# Patient Record
Sex: Male | Born: 1955 | Hispanic: Yes | Marital: Married | Smoking: Former smoker
Health system: Southern US, Community
[De-identification: ages and names within clinical notes are randomized; demographics above are authoritative.]

## PROBLEM LIST (undated history)

## (undated) DIAGNOSIS — R519 Headache, unspecified: Secondary | ICD-10-CM

## (undated) DIAGNOSIS — I255 Ischemic cardiomyopathy: Secondary | ICD-10-CM

## (undated) DIAGNOSIS — E785 Hyperlipidemia, unspecified: Secondary | ICD-10-CM

## (undated) DIAGNOSIS — E119 Type 2 diabetes mellitus without complications: Secondary | ICD-10-CM

## (undated) DIAGNOSIS — N289 Disorder of kidney and ureter, unspecified: Secondary | ICD-10-CM

## (undated) DIAGNOSIS — I213 ST elevation (STEMI) myocardial infarction of unspecified site: Secondary | ICD-10-CM

## (undated) DIAGNOSIS — R51 Headache: Secondary | ICD-10-CM

## (undated) DIAGNOSIS — I2510 Atherosclerotic heart disease of native coronary artery without angina pectoris: Secondary | ICD-10-CM

## (undated) DIAGNOSIS — E669 Obesity, unspecified: Secondary | ICD-10-CM

## (undated) DIAGNOSIS — I10 Essential (primary) hypertension: Secondary | ICD-10-CM

## (undated) DIAGNOSIS — K219 Gastro-esophageal reflux disease without esophagitis: Secondary | ICD-10-CM

## (undated) DIAGNOSIS — Z720 Tobacco use: Secondary | ICD-10-CM

## (undated) DIAGNOSIS — G510 Bell's palsy: Secondary | ICD-10-CM

## (undated) DIAGNOSIS — M1990 Unspecified osteoarthritis, unspecified site: Secondary | ICD-10-CM

## (undated) DIAGNOSIS — I739 Peripheral vascular disease, unspecified: Secondary | ICD-10-CM

## (undated) DIAGNOSIS — Z8719 Personal history of other diseases of the digestive system: Secondary | ICD-10-CM

## (undated) DIAGNOSIS — K449 Diaphragmatic hernia without obstruction or gangrene: Secondary | ICD-10-CM

## (undated) HISTORY — PX: NO PAST SURGERIES: SHX2092

---
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
## 1998-06-20 ENCOUNTER — Emergency Department (HOSPITAL_COMMUNITY): Admission: EM | Admit: 1998-06-20 | Discharge: 1998-06-20 | Payer: Self-pay | Admitting: Emergency Medicine

## 1998-06-20 ENCOUNTER — Encounter: Payer: Self-pay | Admitting: Emergency Medicine

## 2012-01-21 ENCOUNTER — Encounter (HOSPITAL_COMMUNITY): Payer: Self-pay | Admitting: *Deleted

## 2012-01-21 ENCOUNTER — Emergency Department (HOSPITAL_COMMUNITY): Payer: Self-pay

## 2012-01-21 ENCOUNTER — Inpatient Hospital Stay (HOSPITAL_COMMUNITY)
Admission: EM | Admit: 2012-01-21 | Discharge: 2012-01-22 | DRG: 069 | Disposition: A | Discharge status: Home / Self Care | Payer: Self-pay | Attending: Internal Medicine | Admitting: Internal Medicine

## 2012-01-21 DIAGNOSIS — E669 Obesity, unspecified: Secondary | ICD-10-CM | POA: Diagnosis present

## 2012-01-21 DIAGNOSIS — R29810 Facial weakness: Secondary | ICD-10-CM | POA: Diagnosis present

## 2012-01-21 DIAGNOSIS — I10 Essential (primary) hypertension: Secondary | ICD-10-CM | POA: Diagnosis present

## 2012-01-21 DIAGNOSIS — J3489 Other specified disorders of nose and nasal sinuses: Secondary | ICD-10-CM | POA: Diagnosis present

## 2012-01-21 DIAGNOSIS — F17200 Nicotine dependence, unspecified, uncomplicated: Secondary | ICD-10-CM | POA: Diagnosis present

## 2012-01-21 DIAGNOSIS — E119 Type 2 diabetes mellitus without complications: Secondary | ICD-10-CM | POA: Diagnosis present

## 2012-01-21 DIAGNOSIS — G43809 Other migraine, not intractable, without status migrainosus: Secondary | ICD-10-CM

## 2012-01-21 DIAGNOSIS — G459 Transient cerebral ischemic attack, unspecified: Principal | ICD-10-CM | POA: Diagnosis present

## 2012-01-21 DIAGNOSIS — Z6835 Body mass index (BMI) 35.0-35.9, adult: Secondary | ICD-10-CM

## 2012-01-21 DIAGNOSIS — R471 Dysarthria and anarthria: Secondary | ICD-10-CM | POA: Diagnosis present

## 2012-01-21 DIAGNOSIS — R51 Headache: Secondary | ICD-10-CM | POA: Diagnosis present

## 2012-01-21 DIAGNOSIS — I639 Cerebral infarction, unspecified: Secondary | ICD-10-CM | POA: Diagnosis present

## 2012-01-21 DIAGNOSIS — Z720 Tobacco use: Secondary | ICD-10-CM

## 2012-01-21 DIAGNOSIS — I6340 Cerebral infarction due to embolism of unspecified cerebral artery: Secondary | ICD-10-CM

## 2012-01-21 LAB — COMPREHENSIVE METABOLIC PANEL
ALT: 21 U/L (ref 0–53)
AST: 17 U/L (ref 0–37)
Albumin: 3.9 g/dL (ref 3.5–5.2)
Alkaline Phosphatase: 70 U/L (ref 39–117)
BUN: 23 mg/dL (ref 6–23)
CO2: 24 mEq/L (ref 19–32)
Calcium: 9.3 mg/dL (ref 8.4–10.5)
Chloride: 101 mEq/L (ref 96–112)
Creatinine, Ser: 0.82 mg/dL (ref 0.50–1.35)
GFR calc Af Amer: >90 mL/min (ref >90)
GFR calc non Af Amer: >90 mL/min (ref >90)
Glucose, Bld: 148 mg/dL — ABNORMAL HIGH (ref 70–99)
Potassium: 4.2 mEq/L (ref 3.5–5.1)
Sodium: 138 mEq/L (ref 135–145)
Total Bilirubin: 0.6 mg/dL (ref 0.3–1.2)
Total Protein: 7.4 g/dL (ref 6.0–8.3)

## 2012-01-21 LAB — DIFFERENTIAL
Basophils Absolute: 0.1 10*3/uL (ref 0.0–0.1)
Basophils Relative: 1 % (ref 0–1)
Eosinophils Absolute: 0.2 10*3/uL (ref 0.0–0.7)
Eosinophils Relative: 2 % (ref 0–5)
Lymphocytes Relative: 19 % (ref 12–46)
Lymphs Abs: 2.5 10*3/uL (ref 0.7–4.0)
Monocytes Absolute: 1 10*3/uL (ref 0.1–1.0)
Monocytes Relative: 7 % (ref 3–12)
Neutro Abs: 9.2 10*3/uL — ABNORMAL HIGH (ref 1.7–7.7)
Neutrophils Relative %: 71 % (ref 43–77)

## 2012-01-21 LAB — CBC
HCT: 48.1 % (ref 39.0–52.0)
Hemoglobin: 16.7 g/dL (ref 13.0–17.0)
MCH: 31.8 pg (ref 26.0–34.0)
MCHC: 34.7 g/dL (ref 30.0–36.0)
MCV: 91.6 fL (ref 78.0–100.0)
Platelets: 344 10*3/uL (ref 150–400)
RBC: 5.25 MIL/uL (ref 4.22–5.81)
RDW: 13.3 % (ref 11.5–15.5)
WBC: 13 10*3/uL — ABNORMAL HIGH (ref 4.0–10.5)

## 2012-01-21 LAB — POCT I-STAT, CHEM 8
BUN: 24 mg/dL — ABNORMAL HIGH (ref 6–23)
Calcium, Ion: 1.22 mmol/L (ref 1.12–1.32)
Chloride: 105 mEq/L (ref 96–112)
Creatinine, Ser: 0.9 mg/dL (ref 0.50–1.35)
TCO2: 26 mmol/L (ref 0–100)

## 2012-01-21 LAB — PROTIME-INR
INR: 0.97 (ref 0.00–1.49)
Prothrombin Time: 13.1 seconds (ref 11.6–15.2)

## 2012-01-21 LAB — CK TOTAL AND CKMB (NOT AT ARMC)
CK, MB: 3.2 ng/mL (ref 0.3–4.0)
Relative Index: 3.1 — ABNORMAL HIGH (ref 0.0–2.5)
Total CK: 104 U/L (ref 7–232)

## 2012-01-21 LAB — GLUCOSE, CAPILLARY
Glucose-Capillary: 163 mg/dL — ABNORMAL HIGH (ref 70–99)
Glucose-Capillary: 144 mg/dL — ABNORMAL HIGH (ref 70–99)

## 2012-01-21 LAB — APTT: aPTT: 30 seconds (ref 24–37)

## 2012-01-21 LAB — TROPONIN I: Troponin I: <0.3 ng/mL (ref <0.30)

## 2012-01-21 LAB — POCT I-STAT TROPONIN I: Troponin i, poc: 0 ng/mL (ref 0.00–0.08)

## 2012-01-21 MED ORDER — ASPIRIN 325 MG PO TABS
325.0000 mg | ORAL_TABLET | Freq: Every day | ORAL | Status: DC
  Administered 2012-01-22: 325 mg via ORAL
  Filled 2012-01-21: qty 1

## 2012-01-21 MED ORDER — ENOXAPARIN SODIUM 40 MG/0.4ML ~~LOC~~ SOLN
40.0000 mg | Freq: Every day | SUBCUTANEOUS | Status: DC
  Administered 2012-01-21: 40 mg via SUBCUTANEOUS
  Filled 2012-01-21 (×2): qty 0.4

## 2012-01-21 MED ORDER — SODIUM CHLORIDE 0.9 % IV SOLN: 250.0000 mL | INTRAVENOUS | Status: DC | PRN

## 2012-01-21 MED ORDER — ACETAMINOPHEN 325 MG PO TABS: 650.0000 mg | ORAL_TABLET | ORAL | Status: DC | PRN

## 2012-01-21 MED ORDER — INDOMETHACIN 25 MG PO CAPS
25.0000 mg | ORAL_CAPSULE | Freq: Three times a day (TID) | ORAL | Status: DC | PRN
  Filled 2012-01-21: qty 1

## 2012-01-21 MED ORDER — SODIUM CHLORIDE 0.9 % IJ SOLN: 3.0000 mL | INTRAMUSCULAR | Status: DC | PRN

## 2012-01-21 MED ORDER — ASPIRIN 81 MG PO CHEW
324.0000 mg | CHEWABLE_TABLET | Freq: Once | ORAL | Status: AC
  Administered 2012-01-21: 324 mg via ORAL
  Filled 2012-01-21: qty 4

## 2012-01-21 MED ORDER — SODIUM CHLORIDE 0.9 % IJ SOLN
3.0000 mL | Freq: Two times a day (BID) | INTRAMUSCULAR | Status: DC
  Administered 2012-01-21 – 2012-01-22 (×2): 3 mL via INTRAVENOUS

## 2012-01-21 NOTE — ED Provider Notes (Signed)
History    56y male with left facial droop. Dysarthria per his sister but pt denies. Pt tries to downplay his symptoms though. Onset about 3 days ago. Stable since onset. Patient has no other complaints. Patient denies pain anywhere. Numbness of his left face. Denies any numbness anywhere else. Or loss of strength anywhere besides his face. No fevers or chills. Patient denies any significant past medical history but does not have routine medical care.  Pt with no PCP. Goes to Urgent Care in [place] as needed.  CSN: [id number]  Arrival date & time 01/21/12  1702   First MD Initiated Contact with Patient 01/21/12 1911      Chief Complaint  Patient presents with  . Facial Droop  . Aphasia    (Consider location/radiation/quality/duration/timing/severity/associated sxs/prior treatment) HPI  No past medical history on file.  No past surgical history on file.  No family history on file.  History  Substance Use Topics  . Smoking status: Not on file  . Smokeless tobacco: Not on file  . Alcohol Use: Not on file      Review of Systems   Review of symptoms negative unless otherwise noted in HPI.   Allergies  Review of patient's allergies indicates no known allergies.  Home Medications   Current Outpatient Rx  Name Route Sig Dispense Refill  . ADULT MULTIVITAMIN W/MINERALS CH Oral Take 1 tablet by mouth daily.      BP 160/97  Pulse 94  Temp(Src) 98.8 F (37.1 C) (Oral)  Resp 19  SpO2 95%  Physical Exam  Nursing note and vitals reviewed. Constitutional: He appears well-developed and well-nourished. No distress.       Laying in bed. NAD.   HENT:  Head: Normocephalic and atraumatic.  Eyes: Conjunctivae are normal. Right eye exhibits no discharge. Left eye exhibits no discharge.  Neck: Neck supple.  Cardiovascular: Normal rate, regular rhythm and normal heart sounds.  Exam reveals no gallop and no friction rub.   No murmur heard. Pulmonary/Chest: Effort normal  and breath sounds normal. No respiratory distress.  Abdominal: Soft. He exhibits no distension. There is no tenderness.  Musculoskeletal: He exhibits no edema and no tenderness.  Neurological: He is alert.       Left facial droop. No apparent loss of nasolabial fold. Slight left eye ptosis. Patient can clearly wrinkle both sides of his forehead. Dysarthria. Visual fields intact to confrontation. Content appropriate. Good finger to nose testing and rapid alternating finger movements bilaterally. Strength is 5 out of 5 bilateral upper lower extremities. Gait is steady.   Skin: Skin is warm and dry. He is not diaphoretic.  Psychiatric: He has a normal mood and affect. His behavior is normal. Thought content normal.    ED Course  Procedures (including critical care time)  Labs Reviewed  CBC - Abnormal; Notable for the following:    WBC 13.0 (*)    All other components within normal limits  DIFFERENTIAL - Abnormal; Notable for the following:    Neutro Abs 9.2 (*)    All other components within normal limits  COMPREHENSIVE METABOLIC PANEL - Abnormal; Notable for the following:    Glucose, Bld 148 (*)    All other components within normal limits  CK TOTAL AND CKMB - Abnormal; Notable for the following:    Relative Index 3.1 (*)    All other components within normal limits  POCT I-STAT, CHEM 8 - Abnormal; Notable for the following:    BUN 24 (*)  Glucose, Bld 149 (*)    Hemoglobin 17.7 (*)    All other components within normal limits  GLUCOSE, CAPILLARY - Abnormal; Notable for the following:    Glucose-Capillary 163 (*)    All other components within normal limits  PROTIME-INR  APTT  TROPONIN I  POCT I-STAT TROPONIN I   Ct Head Wo Contrast  01/21/2012  *RADIOLOGY REPORT*  Clinical Data: 56 year old male with left facial droop, slurred speech.  CT HEAD WITHOUT CONTRAST  Technique:  Contiguous axial images were obtained from the base of the skull through the vertex without contrast.   Comparison: None.  Findings: Visualized paranasal sinuses and mastoids are clear. Visualized orbits and scalp soft tissues are within normal limits. No acute osseous abnormality identified.  Mild Calcified atherosclerosis at the skull base.  No ventriculomegaly. No midline shift, mass effect, or evidence of mass lesion.  No acute intracranial hemorrhage identified.  Two small foci of hypodensity in the right cerebellum on series 2 image 7.  Gray-white matter differentiation elsewhere is normal. No evidence of acute cerebral cortical infarct. No suspicious intracranial vascular hyperdensity.  IMPRESSION: 1.  Age indeterminate ischemic changes in the right cerebellum. Favor nonacute. 2.  Otherwise negative noncontrast CT appearance of the brain.  Original Report Authenticated By: Harley Hallmark, M.D.     1. CVA (cerebral infarction)       MDM  56y male with left facial droop and dysarthria. The patient can clearly wrinkle his forehead suggesting that this is a central cause and not a peripheral seventh cranial nerve issue. CT of the head significant for ischemic changes in the right cerebellum. Likely not acute. Patient has a nonfocal cerebellar examination. ASA. Admit.        Raeford Razor, MD 01/21/12 2132

## 2012-01-21 NOTE — ED Notes (Signed)
MD at bedside. Dr. Conley Rolls at bedside. Pt placed on NRB at 15L per order Dr. Conley Rolls.

## 2012-01-21 NOTE — ED Notes (Signed)
MD at bedside. Dr. Kohut at bedside.  

## 2012-01-21 NOTE — ED Notes (Signed)
Attempted to call report to 4 W. RN unable to take report at this time. Will call back.  

## 2012-01-21 NOTE — H&P (Signed)
PCP:   Henri Medal, MD, MD   Chief Complaint: Left facial droop, dysarthria, and right sided headache.   HPI: Jonathan Miller is an 56 y.o. male with past medical history, on chronic medication, cigarette smoker, presents to [facility] long emergency room as he had some dysarthria and left facial droop. He has chronic intermittent right-sided headache, accompanied by nasal congestion and watery eyes lasting several days at a time. Interestingly, he said that when he has these symptoms his left eye is smaller than the right, and he feels left facial droop previously with his headaches. He denied left or right extremity weakness, clumsiness , or vertigo. Workup in emergency room included a head CT showing ischemic changes in the right cerebellum. His serologies were rather unremarkable. He is in sinus rhythm.  Rewiew of Systems:  The patient denies anorexia, fever, weight loss,, vision loss, decreased hearing, hoarseness, chest pain, syncope, dyspnea on exertion, peripheral edema, balance deficits, hemoptysis, abdominal pain, melena, hematochezia, severe indigestion/heartburn, hematuria, incontinence, genital sores, muscle weakness, suspicious skin lesions, transient blindness, difficulty walking, depression, unusual weight change, abnormal bleeding, enlarged lymph nodes, angioedema, and breast masses.   No past medical history on file.  No past surgical history on file.  Medications:  HOME MEDS: Prior to Admission medications   Medication Sig Start Date End Date Taking? Authorizing Provider  Multiple Vitamin (MULITIVITAMIN WITH MINERALS) TABS Take 1 tablet by mouth daily.   Yes Historical Provider, MD     Allergies:  No Known Allergies  Social History:   does not have a smoking history on file. He does not have any smokeless tobacco history on file. His alcohol and drug histories not on file.  Family History: No family history on file.   Physical Exam: Filed Vitals:   01/21/12  1800 01/21/12 2007 01/21/12 2012 01/21/12 2151  BP:   160/92 143/88  Pulse: 94  97 72  Temp:  99.3 F (37.4 C)    TempSrc:      Resp: 19  15 16   SpO2: 95%  95% 100%   Blood pressure 143/88, pulse 72, temperature 99.3 F (37.4 C), temperature source Oral, resp. rate 16, SpO2 100.00%.  GEN:  Pleasant person lying in the stretcher in no acute distress; cooperative with exam PSYCH:  alert and oriented x4; does not appear anxious or depressed; affect is appropriate. HEENT: Mucous membranes pink and anicteric; PERRLA; EOM intact; no cervical lymphadenopathy nor thyromegaly or carotid bruit; no JVD; he has left facial droop, able to wrinkle his forehead. Breasts:: Not examined CHEST WALL: No tenderness CHEST: Normal respiration, clear to auscultation bilaterally HEART: Regular rate and rhythm; no murmurs rubs or gallops BACK: No kyphosis or scoliosis; no CVA tenderness ABDOMEN: Obese, soft non-tender; no masses, no organomegaly, normal abdominal bowel sounds; no pannus; no intertriginous candida. Rectal Exam: Not done EXTREMITIES: No bone or joint deformity; age-appropriate arthropathy of the hands and knees; no edema; no ulcerations. Genitalia: not examined PULSES: 2+ and symmetric SKIN: Normal hydration no rash or ulceration CNS: Cranial nerves 2-12 grossly intact no focal lateralizing neurologic deficit. speech is fluent. Uvula elevated with phonation, strength of the upper and lower extremities are normal. Cerebellar exams is intact. Babinski's negative.   Labs & Imaging Results for orders placed during the hospital encounter of 01/21/12 (from the past 48 hour(s))  PROTIME-INR     Status: Normal   Collection Time   01/21/12  5:18 PM      Component Value Range Comment  Prothrombin Time 13.1  11.6 - 15.2 (seconds)    INR 0.97  0.00 - 1.49    APTT     Status: Normal   Collection Time   01/21/12  5:18 PM      Component Value Range Comment   aPTT 30  24 - 37 (seconds)   CBC     Status:  Abnormal   Collection Time   01/21/12  5:18 PM      Component Value Range Comment   WBC 13.0 (*) 4.0 - 10.5 (K/uL)    RBC 5.25  4.22 - 5.81 (MIL/uL)    Hemoglobin 16.7  13.0 - 17.0 (g/dL)    HCT 16.1  09.6 - 04.5 (%)    MCV 91.6  78.0 - 100.0 (fL)    MCH 31.8  26.0 - 34.0 (pg)    MCHC 34.7  30.0 - 36.0 (g/dL)    RDW 40.9  81.1 - 91.4 (%)    Platelets 344  150 - 400 (K/uL)   DIFFERENTIAL     Status: Abnormal   Collection Time   01/21/12  5:18 PM      Component Value Range Comment   Neutrophils Relative 71  43 - 77 (%)    Neutro Abs 9.2 (*) 1.7 - 7.7 (K/uL)    Lymphocytes Relative 19  12 - 46 (%)    Lymphs Abs 2.5  0.7 - 4.0 (K/uL)    Monocytes Relative 7  3 - 12 (%)    Monocytes Absolute 1.0  0.1 - 1.0 (K/uL)    Eosinophils Relative 2  0 - 5 (%)    Eosinophils Absolute 0.2  0.0 - 0.7 (K/uL)    Basophils Relative 1  0 - 1 (%)    Basophils Absolute 0.1  0.0 - 0.1 (K/uL)   COMPREHENSIVE METABOLIC PANEL     Status: Abnormal   Collection Time   01/21/12  5:18 PM      Component Value Range Comment   Sodium 138  135 - 145 (mEq/L)    Potassium 4.2  3.5 - 5.1 (mEq/L)    Chloride 101  96 - 112 (mEq/L)    CO2 24  19 - 32 (mEq/L)    Glucose, Bld 148 (*) 70 - 99 (mg/dL)    BUN 23  6 - 23 (mg/dL)    Creatinine, Ser 7.82  0.50 - 1.35 (mg/dL)    Calcium 9.3  8.4 - 10.5 (mg/dL)    Total Protein 7.4  6.0 - 8.3 (g/dL)    Albumin 3.9  3.5 - 5.2 (g/dL)    AST 17  0 - 37 (U/L)    ALT 21  0 - 53 (U/L)    Alkaline Phosphatase 70  39 - 117 (U/L)    Total Bilirubin 0.6  0.3 - 1.2 (mg/dL)    GFR calc non Af Amer >90  >90 (mL/min)    GFR calc Af Amer >90  >90 (mL/min)   CK TOTAL AND CKMB     Status: Abnormal   Collection Time   01/21/12  5:18 PM      Component Value Range Comment   Total CK 104  7 - 232 (U/L)    CK, MB 3.2  0.3 - 4.0 (ng/mL)    Relative Index 3.1 (*) 0.0 - 2.5    TROPONIN I     Status: Normal   Collection Time   01/21/12  5:18 PM      Component Value Range Comment  Troponin I  <0.30  <0.30 (ng/mL)   POCT I-STAT TROPONIN I     Status: Normal   Collection Time   01/21/12  6:13 PM      Component Value Range Comment   Troponin i, poc 0.00  0.00 - 0.08 (ng/mL)    Comment 3            POCT I-STAT, CHEM 8     Status: Abnormal   Collection Time   01/21/12  6:14 PM      Component Value Range Comment   Sodium 139  135 - 145 (mEq/L)    Potassium 4.1  3.5 - 5.1 (mEq/L)    Chloride 105  96 - 112 (mEq/L)    BUN 24 (*) 6 - 23 (mg/dL)    Creatinine, Ser 1.47  0.50 - 1.35 (mg/dL)    Glucose, Bld 829 (*) 70 - 99 (mg/dL)    Calcium, Ion 5.62  1.12 - 1.32 (mmol/L)    TCO2 26  0 - 100 (mmol/L)    Hemoglobin 17.7 (*) 13.0 - 17.0 (g/dL)    HCT 13.0  86.5 - 78.4 (%)   GLUCOSE, CAPILLARY     Status: Abnormal   Collection Time   01/21/12  6:14 PM      Component Value Range Comment   Glucose-Capillary 163 (*) 70 - 99 (mg/dL)    Ct Head Wo Contrast  01/21/2012  *RADIOLOGY REPORT*  Clinical Data: 56 year old male with left facial droop, slurred speech.  CT HEAD WITHOUT CONTRAST  Technique:  Contiguous axial images were obtained from the base of the skull through the vertex without contrast.  Comparison: None.  Findings: Visualized paranasal sinuses and mastoids are clear. Visualized orbits and scalp soft tissues are within normal limits. No acute osseous abnormality identified.  Mild Calcified atherosclerosis at the skull base.  No ventriculomegaly. No midline shift, mass effect, or evidence of mass lesion.  No acute intracranial hemorrhage identified.  Two small foci of hypodensity in the right cerebellum on series 2 image 7.  Gray-white matter differentiation elsewhere is normal. No evidence of acute cerebral cortical infarct. No suspicious intracranial vascular hyperdensity.  IMPRESSION: 1.  Age indeterminate ischemic changes in the right cerebellum. Favor nonacute. 2.  Otherwise negative noncontrast CT appearance of the brain.  Original Report Authenticated By: Harley Hallmark, M.D.       Assessment Present on Admission:  .CVA (cerebral infarction) .Headache   PLAN:  Will admit him for TIA-CVA workup. He has not been on aspirin so we'll start him on aspirin a day. Workup will include MRI/MRA of the head, Doppler of the carotids, and echo. I strongly recommend he stop smoking. His headache is classic for clusters headache. I gave him 100% oxygen in emergency room, and his headache resolved completely. No give indomethacin when necessary for his headache. He is stable, full code, and will be admitted to [facility].   Other plans as per orders.    Rollande Thursby 01/21/2012, 9:55 PM

## 2012-01-21 NOTE — ED Notes (Signed)
Pt states that 3 days ago he noticed his lt side facial droop and states that his eye has been watery. Alert x4, 4/4 grip strength. Sister states that it is getting worse and that he does have slurred speech,

## 2012-01-22 ENCOUNTER — Inpatient Hospital Stay (HOSPITAL_COMMUNITY): Payer: Self-pay

## 2012-01-22 ENCOUNTER — Encounter (HOSPITAL_COMMUNITY): Payer: Self-pay | Admitting: *Deleted

## 2012-01-22 DIAGNOSIS — I6340 Cerebral infarction due to embolism of unspecified cerebral artery: Secondary | ICD-10-CM

## 2012-01-22 DIAGNOSIS — G43809 Other migraine, not intractable, without status migrainosus: Secondary | ICD-10-CM

## 2012-01-22 DIAGNOSIS — G459 Transient cerebral ischemic attack, unspecified: Secondary | ICD-10-CM

## 2012-01-22 DIAGNOSIS — F17200 Nicotine dependence, unspecified, uncomplicated: Secondary | ICD-10-CM

## 2012-01-22 LAB — CBC
HCT: 47.3 % (ref 39.0–52.0)
MCHC: 34.2 g/dL (ref 30.0–36.0)
Platelets: 340 10*3/uL (ref 150–400)
RDW: 13.3 % (ref 11.5–15.5)
WBC: 9.3 10*3/uL (ref 4.0–10.5)

## 2012-01-22 LAB — BASIC METABOLIC PANEL
BUN: 17 mg/dL (ref 6–23)
Chloride: 99 mEq/L (ref 96–112)
GFR calc Af Amer: >90 mL/min (ref >90)
GFR calc non Af Amer: >90 mL/min (ref >90)
Potassium: 4.5 mEq/L (ref 3.5–5.1)
Sodium: 134 mEq/L — ABNORMAL LOW (ref 135–145)

## 2012-01-22 LAB — GLUCOSE, CAPILLARY: Glucose-Capillary: 247 mg/dL — ABNORMAL HIGH (ref 70–99)

## 2012-01-22 LAB — HEMOGLOBIN A1C
Hgb A1c MFr Bld: 9.7 % — ABNORMAL HIGH (ref <5.7)
Mean Plasma Glucose: 232 mg/dL — ABNORMAL HIGH (ref <117)

## 2012-01-22 LAB — RAPID URINE DRUG SCREEN, HOSP PERFORMED
Amphetamines: NOT DETECTED
Barbiturates: NOT DETECTED
Benzodiazepines: NOT DETECTED

## 2012-01-22 LAB — LIPID PANEL
Cholesterol: 175 mg/dL (ref 0–200)
LDL Cholesterol: 76 mg/dL (ref 0–99)
Triglycerides: 279 mg/dL — ABNORMAL HIGH (ref <150)

## 2012-01-22 MED ORDER — ASPIRIN 81 MG PO TABS: 81.0000 mg | ORAL_TABLET | Freq: Every day | ORAL | Status: AC

## 2012-01-22 MED ORDER — LIVING WELL WITH DIABETES BOOK - IN SPANISH
Freq: Once | Status: AC
  Administered 2012-01-22: 12:00:00
  Filled 2012-01-22: qty 1

## 2012-01-22 MED ORDER — LISINOPRIL 10 MG PO TABS: 20.0000 mg | ORAL_TABLET | Freq: Every day | ORAL | Status: DC

## 2012-01-22 MED ORDER — METFORMIN HCL 500 MG PO TABS: 500.0000 mg | ORAL_TABLET | Freq: Two times a day (BID) | ORAL | Status: DC

## 2012-01-22 NOTE — Discharge Summary (Signed)
Patient ID: Jonathan Miller MRN: [id number] DOB/AGE: 1956-01-11 56 y.o.  Admit date: 01/21/2012 Discharge date: 01/22/2012  Primary Care Physician:  Henri Medal, MD, MD  Discharge Diagnoses:  Dysarthria secondary to TIA  Present on Admission:  .CVA (cerebral infarction) .Headache  Active Problems:  CVA (cerebral infarction)  Headache  Tobacco abuse   Medication List  As of 01/22/2012 11:34 AM   TAKE these medications         aspirin 81 MG tablet   Take 1 tablet (81 mg total) by mouth daily.      lisinopril 10 MG tablet   Commonly known as: PRINIVIL,ZESTRIL   Take 2 tablets (20 mg total) by mouth daily.      metFORMIN 500 MG tablet   Commonly known as: GLUCOPHAGE   Take 1 tablet (500 mg total) by mouth 2 (two) times daily with a meal.      mulitivitamin with minerals Tabs   Take 1 tablet by mouth daily.            Disposition and Follow-up: Will need to see primary care provider 2 weeks after the discharge. The patient was diagnosed with diabetes, and for pt this is new diagnosis this is new diagnosis. Pt was insisting on leaving the hospital today for diabetic educator was not able to see patient prior to discharge. I have spent over one hour discussing diabetes, nutrition management, importance of medical compliance. I've also discussed with patient need for potential insulin, but pt refuses insulin at this time. I have discussed with patient meaning of A1C and explained that this number needs to be rechecked in 3 months to ensure the diabetes is being well controlled. If A1C remains persistently higher than 8, patient may need to have metformin level readjusted or consideration of insulin should be discussed. The patient was started on metformin 500 mg tablets to take twice daily.  Consults:  none  Significant Diagnostic Studies:   MRI brain:  no acute intracranial abnormalities:  CT head:  no acute intracranial abnormalities  Brief H and P: Jonathan Miller is  an 56 y.o. male with past medical history, on chronic medication, cigarette smoker, presents to [facility] long emergency room as he had some dysarthria and left facial droop. He has chronic intermittent right-sided headache, accompanied by nasal congestion and watery eyes lasting several days at a time. Interestingly, he said that when he has these symptoms his left eye is smaller than the right, and he feels left facial droop previously with his headaches. He denied left or right extremity weakness, clumsiness , or vertigo. Workup in emergency room included a head CT showing ischemic changes in the right cerebellum. His serologies were rather unremarkable. He is in sinus rhythm.  Physical Exam on Discharge:  Filed Vitals:   01/21/12 2304 01/22/12 0205 01/22/12 0538 01/22/12 1008  BP: 147/79 134/88 144/89 137/79  Pulse: 74 82 78 91  Temp: 98.2 F (36.8 C) 98.6 F (37 C) 98.4 F (36.9 C) 98.8 F (37.1 C)  TempSrc: Oral Oral Oral Oral  Resp: 18 18 18 16   Height: 5\' 5"  (1.651 m)     Weight: 96.5 kg (212 lb 11.9 oz)     SpO2: 94% 100% 97% 96%     Intake/Output Summary (Last 24 hours) at 01/22/12 1134 Last data filed at 01/22/12 0600  Gross per 24 hour  Intake      0 ml  Output    460 ml  Net   -460  ml    General: Alert, awake, oriented x3, in no acute distress. HEENT: No bruits, no goiter. Heart: Regular rate and rhythm, without murmurs, rubs, gallops. Lungs: Clear to auscultation bilaterally. Abdomen: Soft, nontender, nondistended, positive bowel sounds. Extremities: No clubbing cyanosis or edema with positive pedal pulses. Neuro: Grossly intact, nonfocal. Speech clear  CBC:    Component Value Date/Time   WBC 13.0* 01/21/2012 1718   HGB 17.7* 01/21/2012 1814   HCT 52.0 01/21/2012 1814   PLT 344 01/21/2012 1718   MCV 91.6 01/21/2012 1718   NEUTROABS 9.2* 01/21/2012 1718   LYMPHSABS 2.5 01/21/2012 1718   MONOABS 1.0 01/21/2012 1718   EOSABS 0.2 01/21/2012 1718   BASOSABS 0.1 01/21/2012 1718     Basic Metabolic Panel:    Component Value Date/Time   NA 139 01/21/2012 1814   K 4.1 01/21/2012 1814   CL 105 01/21/2012 1814   CO2 24 01/21/2012 1718   BUN 24* 01/21/2012 1814   CREATININE 0.90 01/21/2012 1814   GLUCOSE 149* 01/21/2012 1814   CALCIUM 9.3 01/21/2012 1718    Hospital Course:   Active Problems:  Dysarthria - this was determined to be most likely secondary to TIA - preliminary vascular dopplers study is negative and i have advised the pt to stay additional day to await results of 2 D ECHO - pt's symptoms have resolved and he was insisting on going home today - I have discussed the importance of medical compliance, dietary compliance - pt will be discharged on Aspirin 81 mg tablet once daily, Lisinopril, Metformin  HTN - discussed goal BP < 130/80 - will start Lisinopril  - pt will need to have BMP obtained in 1-2 weeks to ensure that electrolytes are within normal limits  Diabetes - ordered diabetic educator but pt refuses to wait - I have spent over 1 hour discussing diet and medical compliance - will start metformin for now   Headache - resolved   Tobacco abuse - discussed cessation   Time spent on Discharge: Over 30 minutes  Signed: Debbora Presto 01/22/2012, 11:34 AM  [facility], pager #: [phone] Main office number: [phone]

## 2012-01-22 NOTE — Progress Notes (Signed)
*  PRELIMINARY RESULTS* Echocardiogram 2D Echocardiogram has been performed.  Jeryl Columbia R 01/22/2012, 10:00 AM

## 2012-01-22 NOTE — Discharge Instructions (Signed)
Diabetes, Type 2 Diabetes is a long-lasting (chronic) disease. In type 2 diabetes, the pancreas does not make enough insulin (a hormone), and the body does not respond normally to the insulin that is made. This type of diabetes was also previously called adult-onset diabetes. It usually occurs after the age of 70, but it can occur at any age.  CAUSES  Type 2 diabetes happens because the pancreasis not making enough insulin or your body has trouble using the insulin that your pancreas does make properly. SYMPTOMS   Drinking more than usual.   Urinating more than usual.   Blurred vision.   Dry, itchy skin.   Frequent infections.   Feeling more tired than usual (fatigue).  DIAGNOSIS The diagnosis of type 2 diabetes is usually made by one of the following tests:  Fasting blood glucose test. You will not eat for at least 8 hours and then take a blood test.   Random blood glucose test. Your blood glucose (sugar) is checked at any time of the day regardless of when you ate.   Oral glucose tolerance test (OGTT). Your blood glucose is measured after you have not eaten (fasted) and then after you drink a glucose containing beverage.  TREATMENT   Healthy eating.   Exercise.   Medicine, if needed.   Monitoring blood glucose.   Seeing your caregiver regularly.  HOME CARE INSTRUCTIONS   Check your blood glucose at least once a day. More frequent monitoring may be necessary, depending on your medicines and on how well your diabetes is controlled. Your caregiver will advise you.   Take your medicine as directed by your caregiver.   Do not smoke.   Make wise food choices. Ask your caregiver for information. Weight loss can improve your diabetes.   Learn about low blood glucose (hypoglycemia) and how to treat it.   Get your eyes checked regularly.   Have a yearly physical exam. Have your blood pressure checked and your blood and urine tested.   Wear a pendant or bracelet saying  that you have diabetes.   Check your feet every night for cuts, sores, blisters, and redness. Let your caregiver know if you have any problems.  SEEK MEDICAL CARE IF:   You have problems keeping your blood glucose in target range.   You have problems with your medicines.   You have symptoms of an illness that do not improve after 24 hours.   You have a sore or wound that is not healing.   You notice a change in vision or a new problem with your vision.   You have a fever.  MAKE SURE YOU:  Understand these instructions.   Will watch your condition.   Will get help right away if you are not doing well or get worse.  Document Released: 09/06/2005 Document Revised: 08/26/2011 Document Reviewed: 02/22/2011 [facility] Patient Information 2012 [place], [place].Diabetes Meal Planning Guide The diabetes meal planning guide is a tool to help you plan your meals and snacks. It is important for people with diabetes to manage their blood glucose (sugar) levels. Choosing the right foods and the right amounts throughout your day will help control your blood glucose. Eating right can even help you improve your blood pressure and reach or maintain a healthy weight. CARBOHYDRATE COUNTING MADE EASY When you eat carbohydrates, they turn to sugar. This raises your blood glucose level. Counting carbohydrates can help you control this level so you feel better. When you plan your meals by counting  carbohydrates, you can have more flexibility in what you eat and balance your medicine with your food intake. Carbohydrate counting simply means adding up the total amount of carbohydrate grams in your meals and snacks. Try to eat about the same amount at each meal. Foods with carbohydrates are listed below. Each portion below is 1 carbohydrate serving or 15 grams of carbohydrates. Ask your dietician how many grams of carbohydrates you should eat at each meal or snack. Grains and Starches  1 slice bread.    English  muffin or hotdog/hamburger bun.    cup cold cereal (unsweetened).   ? cup cooked pasta or rice.    cup starchy vegetables (corn, potatoes, peas, beans, winter squash).   1 tortilla (6 inches).    bagel.   1 waffle or pancake (size of a CD).    cup cooked cereal.   4 to 6 small crackers.  *Whole grain is recommended. Fruit  1 cup fresh unsweetened berries, melon, papaya, pineapple.   1 small fresh fruit.    banana or mango.    cup fruit juice (4 oz unsweetened).    cup canned fruit in natural juice or water.   2 tbs dried fruit.   12 to 15 grapes or cherries.  Milk and Yogurt  1 cup fat-free or 1% milk.   1 cup soy milk.   6 oz light yogurt with sugar-free sweetener.   6 oz low-fat soy yogurt.   6 oz plain yogurt.  Vegetables  1 cup raw or  cup cooked is counted as 0 carbohydrates or a "free" food.   If you eat 3 or more servings at 1 meal, count them as 1 carbohydrate serving.  Other Carbohydrates   oz chips or pretzels.    cup ice cream or frozen yogurt.    cup sherbet or sorbet.   2 inch square cake, no frosting.   1 tbs honey, sugar, jam, jelly, or syrup.   2 small cookies.   3 squares of graham crackers.   3 cups popcorn.   6 crackers.   1 cup broth-based soup.   Count 1 cup casserole or other mixed foods as 2 carbohydrate servings.   Foods with less than 20 calories in a serving may be counted as 0 carbohydrates or a "free" food.  You may want to purchase a book or computer software that lists the carbohydrate gram counts of different foods. In addition, the nutrition facts panel on the labels of the foods you eat are a good source of this information. The label will tell you how big the serving size is and the total number of carbohydrate grams you will be eating per serving. Divide this number by 15 to obtain the number of carbohydrate servings in a portion. Remember, 1 carbohydrate serving equals 15 grams of  carbohydrate. SERVING SIZES Measuring foods and serving sizes helps you make sure you are getting the right amount of food. The list below tells how big or small some common serving sizes are.  1 oz.........4 stacked dice.   3 oz........Marland KitchenDeck of cards.   1 tsp.......Marland KitchenTip of little finger.   1 tbs......Marland KitchenMarland KitchenThumb.   2 tbs.......Marland KitchenGolf ball.    cup......Marland KitchenHalf of a fist.   1 cup.......Marland KitchenA fist.  SAMPLE DIABETES MEAL PLAN Below is a sample meal plan that includes foods from the grain and starches, dairy, vegetable, fruit, and meat groups. A dietician can individualize a meal plan to fit your calorie needs and tell you the number  of servings needed from each food group. However, controlling the total amount of carbohydrates in your meal or snack is more important than making sure you include all of the food groups at every meal. You may interchange carbohydrate containing foods (dairy, starches, and fruits). The meal plan below is an example of a 2000 calorie diet using carbohydrate counting. This meal plan has 17 carbohydrate servings. Breakfast  1 cup oatmeal (2 carb servings).    cup light yogurt (1 carb serving).   1 cup blueberries (1 carb serving).    cup almonds.  Snack  1 large apple (2 carb servings).   1 low-fat string cheese stick.  Lunch  Chicken breast salad.   1 cup spinach.    cup chopped tomatoes.   2 oz chicken breast, sliced.   2 tbs low-fat [place] dressing.   12 whole-wheat crackers (2 carb servings).   12 to 15 grapes (1 carb serving).   1 cup low-fat milk (1 carb serving).  Snack  1 cup carrots.    cup hummus (1 carb serving).  Dinner  3 oz broiled salmon.   1 cup brown rice (3 carb servings).  Snack  1  cups steamed broccoli (1 carb serving) drizzled with 1 tsp olive oil and lemon juice.   1 cup light pudding (2 carb servings).  DIABETES MEAL PLANNING WORKSHEET Your dietician can use this worksheet to help you decide how many  servings of foods and what types of foods are right for you.  BREAKFAST Food Group and Servings / Carb Servings Grain/Starches __________________________________ Dairy __________________________________________ Vegetable ______________________________________ Fruit ___________________________________________ Meat __________________________________________ Fat ____________________________________________ LUNCH Food Group and Servings / Carb Servings Grain/Starches ___________________________________ Dairy ___________________________________________ Fruit ____________________________________________ Meat ___________________________________________ Fat _____________________________________________ [place] Food Group and Servings / Carb Servings Grain/Starches ___________________________________ Dairy ___________________________________________ Fruit ____________________________________________ Meat ___________________________________________ Fat _____________________________________________ SNACKS Food Group and Servings / Carb Servings Grain/Starches ___________________________________ Dairy ___________________________________________ Vegetable _______________________________________ Fruit ____________________________________________ Meat ___________________________________________ Fat _____________________________________________ DAILY TOTALS Starches _________________________ Vegetable ________________________ Fruit ____________________________ Dairy ____________________________ Meat ____________________________ Fat ______________________________ Document Released: 06/03/2005 Document Revised: 08/26/2011 Document Reviewed: 04/14/2009 [employer] Patient Information 2012 [place], [place].Diabetes and Exercise Regular exercise is important and can help:   Control blood glucose (sugar).   Decrease blood pressure.    Control blood lipids (cholesterol, triglycerides).    Improve overall health.  BENEFITS FROM EXERCISE  Improved fitness.   Improved flexibility.   Improved endurance.   Increased bone density.   Weight control.   Increased muscle strength.   Decreased body fat.   Improvement of the body's use of insulin, a hormone.   Increased insulin sensitivity.   Reduction of insulin needs.   Reduced stress and tension.   Helps you feel better.  People with diabetes who add exercise to their lifestyle gain additional benefits, including:  Weight loss.   Reduced appetite.   Improvement of the body's use of blood glucose.   Decreased risk factors for heart disease:   Lowering of cholesterol and triglycerides.   Raising the level of good cholesterol (high-density lipoproteins, HDL).   Lowering blood sugar.   Decreased blood pressure.  TYPE 1 DIABETES AND EXERCISE  Exercise will usually lower your blood glucose.   If blood glucose is greater than 240 mg/dl, check urine ketones. If ketones are present, do not exercise.   Location of the insulin injection sites may need to be adjusted with exercise. Avoid injecting insulin into areas of the body that will be exercised. For example, avoid injecting insulin  into:   The arms when playing tennis.   The legs when jogging. For more information, discuss this with your caregiver.   Keep a record of:   Food intake.   Type and amount of exercise.   Expected peak times of insulin action.   Blood glucose levels.  Do this before, during, and after exercise. Review your records with your caregiver. This will help you to develop guidelines for adjusting food intake and insulin amounts.  TYPE 2 DIABETES AND EXERCISE  Regular physical activity can help control blood glucose.   Exercise is important because it may:   Increase the body's sensitivity to insulin.   Improve blood glucose control.   Exercise reduces the risk of heart disease. It decreases serum cholesterol and  triglycerides. It also lowers blood pressure.   Those who take insulin or oral hypoglycemic agents should watch for signs of hypoglycemia. These signs include dizziness, shaking, sweating, chills, and confusion.   Body water is lost during exercise. It must be replaced. This will help to avoid loss of body fluids (dehydration) or heat stroke.  Be sure to talk to your caregiver before starting an exercise program to make sure it is safe for you. Remember, any activity is better than none.  Document Released: 11/27/2003 Document Revised: 08/26/2011 Document Reviewed: 03/13/2009 [facility] Patient Information 2012 [place], [place].

## 2012-01-22 NOTE — Progress Notes (Signed)
Patient discharged home with family, discharge instructions given and explained to paitent/family and they verbalized understand, Patient denies and pain/distress. TIA and diabetes education given and explained to patient and family and patient also watched the diabetes videos prior to discharge; F/U with PCP outpatient. Accompanied home by family. Skin intact, no wound.

## 2012-01-22 NOTE — Progress Notes (Signed)
VASCULAR LAB PRELIMINARY  PRELIMINARY  PRELIMINARY  PRELIMINARY  Carotid Dopplers completed.    Preliminary report:  No ICA stenosis.  Vertebral artery flow is antegrade.  Jonathan Miller, 01/22/2012, 11:34 AM

## 2013-02-04 ENCOUNTER — Encounter (HOSPITAL_COMMUNITY): Payer: Self-pay

## 2013-02-04 ENCOUNTER — Emergency Department (HOSPITAL_COMMUNITY)
Admission: EM | Admit: 2013-02-04 | Discharge: 2013-02-04 | Discharge status: Against Medical Advice | Payer: Self-pay | Attending: Emergency Medicine | Admitting: Emergency Medicine

## 2013-02-04 ENCOUNTER — Emergency Department (HOSPITAL_COMMUNITY): Payer: Self-pay

## 2013-02-04 DIAGNOSIS — J3489 Other specified disorders of nose and nasal sinuses: Secondary | ICD-10-CM | POA: Insufficient documentation

## 2013-02-04 DIAGNOSIS — E119 Type 2 diabetes mellitus without complications: Secondary | ICD-10-CM | POA: Insufficient documentation

## 2013-02-04 DIAGNOSIS — R059 Cough, unspecified: Secondary | ICD-10-CM | POA: Insufficient documentation

## 2013-02-04 DIAGNOSIS — Z5321 Procedure and treatment not carried out due to patient leaving prior to being seen by health care provider: Secondary | ICD-10-CM

## 2013-02-04 DIAGNOSIS — F17200 Nicotine dependence, unspecified, uncomplicated: Secondary | ICD-10-CM | POA: Insufficient documentation

## 2013-02-04 DIAGNOSIS — Z79899 Other long term (current) drug therapy: Secondary | ICD-10-CM | POA: Insufficient documentation

## 2013-02-04 DIAGNOSIS — R05 Cough: Secondary | ICD-10-CM | POA: Insufficient documentation

## 2013-02-04 NOTE — ED Provider Notes (Signed)
History     CSN: [id number]  Arrival date & time 02/04/13  0012   First MD Initiated Contact with Patient 02/04/13 [phone]      Chief Complaint  Patient presents with  . Cough  . sinus pressure     (Consider location/radiation/quality/duration/timing/severity/associated sxs/prior treatment) Patient is a 57 y.o. male presenting with cough.  Cough   I went into the room to examine the patient and he was not there. I spoke with the nurse for his room and was told that the patient left without being seen.  Past Medical History  Diagnosis Date  . Headache   . Diabetes mellitus without complication     History reviewed. No pertinent past surgical history.  No family history on file.  History  Substance Use Topics  . Smoking status: Current Every Day Smoker -- 1.00 packs/day for 44 years    Types: Cigarettes  . Smokeless tobacco: Not on file  . Alcohol Use: Yes      Review of Systems  Respiratory: Positive for cough.     Allergies  Review of patient's allergies indicates no known allergies.  Home Medications   Current Outpatient Rx  Name  Route  Sig  Dispense  Refill  . metFORMIN (GLUCOPHAGE) 500 MG tablet   Oral   Take 1 tablet (500 mg total) by mouth 2 (two) times daily with a meal.   60 tablet   3   . Multiple Vitamin (MULITIVITAMIN WITH MINERALS) TABS   Oral   Take 1 tablet by mouth daily.         Marland Kitchen EXPIRED: lisinopril (PRINIVIL) 10 MG tablet   Oral   Take 2 tablets (20 mg total) by mouth daily.   30 tablet   3     BP 147/76  Pulse 88  Temp(Src) 98.3 F (36.8 C) (Oral)  Resp 20  Ht 5\' 5"  (1.651 m)  Wt 191 lb (86.637 kg)  BMI 31.78 kg/m2  SpO2 97%  Physical Exam  ED Course  Procedures (including critical care time)  Labs Reviewed - No data to display Dg Chest 2 View  02/04/2013   *RADIOLOGY REPORT*  Clinical Data: Cough and congestion.  CHEST - 2 VIEW  Comparison: None.  Findings: Degenerative glenohumeral arthropathy noted  bilaterally. Flattened hemidiaphragms noted.  Thoracic spondylosis is present. Mild cardiomegaly noted, without edema.  No discrete airspace opacity.  IMPRESSION:  1.  Large lung volumes suggesting emphysema. 2.  Degenerative glenohumeral arthropathy bilaterally. 3.  Mild cardiomegaly.   Original Report Authenticated By: Gaylyn Rong, M.D.     MDM  Patient left prior to examination by MD or Midlevel provider.        Janne Napoleon, [place] 02/04/13 [phone]

## 2013-02-04 NOTE — ED Notes (Signed)
Pt left dept did not say anything to this nurse about leaving. Pt left w/o signing AMA papers.

## 2013-02-04 NOTE — ED Notes (Signed)
Cough, congestion, sinus pressure x 3 days

## 2013-02-04 NOTE — ED Notes (Signed)
Pt reports sinus pressure & cough for 3 days.

## 2013-02-05 NOTE — ED Provider Notes (Signed)
Medical screening examination/treatment/procedure(s) were performed by non-physician practitioner and as supervising physician I was immediately available for consultation/collaboration.  Nicoletta Dress. Colon Branch, MD 02/05/13 [phone]

## 2016-02-08 ENCOUNTER — Emergency Department (HOSPITAL_COMMUNITY): Payer: Self-pay

## 2016-02-08 ENCOUNTER — Inpatient Hospital Stay (HOSPITAL_COMMUNITY)
Admission: EM | Admit: 2016-02-08 | Discharge: 2016-02-10 | DRG: 066 | Disposition: A | Discharge status: Home / Self Care | Payer: Self-pay | Attending: Internal Medicine | Admitting: Internal Medicine

## 2016-02-08 ENCOUNTER — Encounter (HOSPITAL_COMMUNITY): Payer: Self-pay

## 2016-02-08 DIAGNOSIS — R51 Headache: Secondary | ICD-10-CM

## 2016-02-08 DIAGNOSIS — F17210 Nicotine dependence, cigarettes, uncomplicated: Secondary | ICD-10-CM | POA: Diagnosis present

## 2016-02-08 DIAGNOSIS — Z6831 Body mass index (BMI) 31.0-31.9, adult: Secondary | ICD-10-CM

## 2016-02-08 DIAGNOSIS — Z9114 Patient's other noncompliance with medication regimen: Secondary | ICD-10-CM

## 2016-02-08 DIAGNOSIS — Z91148 Patient's other noncompliance with medication regimen for other reason: Secondary | ICD-10-CM

## 2016-02-08 DIAGNOSIS — G510 Bell's palsy: Secondary | ICD-10-CM

## 2016-02-08 DIAGNOSIS — I10 Essential (primary) hypertension: Secondary | ICD-10-CM | POA: Diagnosis present

## 2016-02-08 DIAGNOSIS — I639 Cerebral infarction, unspecified: Principal | ICD-10-CM | POA: Diagnosis present

## 2016-02-08 DIAGNOSIS — E119 Type 2 diabetes mellitus without complications: Secondary | ICD-10-CM

## 2016-02-08 DIAGNOSIS — E669 Obesity, unspecified: Secondary | ICD-10-CM | POA: Diagnosis present

## 2016-02-08 DIAGNOSIS — Z720 Tobacco use: Secondary | ICD-10-CM | POA: Diagnosis present

## 2016-02-08 DIAGNOSIS — E785 Hyperlipidemia, unspecified: Secondary | ICD-10-CM | POA: Diagnosis present

## 2016-02-08 DIAGNOSIS — I6389 Other cerebral infarction: Secondary | ICD-10-CM

## 2016-02-08 DIAGNOSIS — R739 Hyperglycemia, unspecified: Secondary | ICD-10-CM | POA: Insufficient documentation

## 2016-02-08 DIAGNOSIS — R519 Headache, unspecified: Secondary | ICD-10-CM | POA: Diagnosis present

## 2016-02-08 DIAGNOSIS — Z9119 Patient's noncompliance with other medical treatment and regimen: Secondary | ICD-10-CM

## 2016-02-08 HISTORY — DX: Personal history of other diseases of the digestive system: Z87.19

## 2016-02-08 HISTORY — DX: Tobacco use: Z72.0

## 2016-02-08 HISTORY — DX: Unspecified osteoarthritis, unspecified site: M19.90

## 2016-02-08 HISTORY — DX: Type 2 diabetes mellitus without complications: E11.9

## 2016-02-08 HISTORY — DX: Headache, unspecified: R51.9

## 2016-02-08 HISTORY — DX: Gastro-esophageal reflux disease without esophagitis: K21.9

## 2016-02-08 HISTORY — DX: Bell's palsy: G51.0

## 2016-02-08 HISTORY — DX: Obesity, unspecified: E66.9

## 2016-02-08 HISTORY — DX: Essential (primary) hypertension: I10

## 2016-02-08 HISTORY — DX: Headache: R51

## 2016-02-08 LAB — COMPREHENSIVE METABOLIC PANEL
ALBUMIN: 3.3 g/dL — AB (ref 3.5–5.0)
ALK PHOS: 64 U/L (ref 38–126)
ALT: 21 U/L (ref 17–63)
ANION GAP: 8 (ref 5–15)
AST: 20 U/L (ref 15–41)
BUN: 15 mg/dL (ref 6–20)
CALCIUM: 9 mg/dL (ref 8.9–10.3)
CO2: 25 mmol/L (ref 22–32)
Chloride: 101 mmol/L (ref 101–111)
Creatinine, Ser: 0.8 mg/dL (ref 0.61–1.24)
GFR calc non Af Amer: >60 mL/min (ref >60)
GLUCOSE: 241 mg/dL — AB (ref 65–99)
POTASSIUM: 3.8 mmol/L (ref 3.5–5.1)
SODIUM: 134 mmol/L — AB (ref 135–145)
Total Bilirubin: 0.4 mg/dL (ref 0.3–1.2)
Total Protein: 6.4 g/dL — ABNORMAL LOW (ref 6.5–8.1)

## 2016-02-08 LAB — URINALYSIS, ROUTINE W REFLEX MICROSCOPIC
BILIRUBIN URINE: NEGATIVE
Glucose, UA: >1000 mg/dL — AB
Hgb urine dipstick: NEGATIVE
Ketones, ur: NEGATIVE mg/dL
Leukocytes, UA: NEGATIVE
NITRITE: NEGATIVE
Protein, ur: 100 mg/dL — AB
SPECIFIC GRAVITY, URINE: 1.023 (ref 1.005–1.030)
pH: 5.5 (ref 5.0–8.0)

## 2016-02-08 LAB — URINE MICROSCOPIC-ADD ON
Bacteria, UA: NONE SEEN
RBC / HPF: NONE SEEN RBC/hpf (ref 0–5)

## 2016-02-08 LAB — CBC
HCT: 48.1 % (ref 39.0–52.0)
Hemoglobin: 15.7 g/dL (ref 13.0–17.0)
MCH: 30.4 pg (ref 26.0–34.0)
MCHC: 32.6 g/dL (ref 30.0–36.0)
MCV: 93 fL (ref 78.0–100.0)
PLATELETS: 282 10*3/uL (ref 150–400)
RBC: 5.17 MIL/uL (ref 4.22–5.81)
RDW: 12.7 % (ref 11.5–15.5)
WBC: 8.5 10*3/uL (ref 4.0–10.5)

## 2016-02-08 LAB — I-STAT TROPONIN, ED: Troponin i, poc: 0 ng/mL (ref 0.00–0.08)

## 2016-02-08 LAB — CBG MONITORING, ED
GLUCOSE-CAPILLARY: 201 mg/dL — AB (ref 65–99)
Glucose-Capillary: 221 mg/dL — ABNORMAL HIGH (ref 65–99)

## 2016-02-08 LAB — DIFFERENTIAL
BASOS PCT: 1 %
Basophils Absolute: 0.1 10*3/uL (ref 0.0–0.1)
EOS ABS: 0.3 10*3/uL (ref 0.0–0.7)
EOS PCT: 3 %
Lymphocytes Relative: 23 %
Lymphs Abs: 2 10*3/uL (ref 0.7–4.0)
MONO ABS: 0.6 10*3/uL (ref 0.1–1.0)
Monocytes Relative: 7 %
NEUTROS PCT: 66 %
Neutro Abs: 5.6 10*3/uL (ref 1.7–7.7)

## 2016-02-08 LAB — APTT: aPTT: 28 seconds (ref 24–37)

## 2016-02-08 LAB — I-STAT CHEM 8, ED
BUN: 18 mg/dL (ref 6–20)
CALCIUM ION: 1.26 mmol/L (ref 1.13–1.30)
CHLORIDE: 101 mmol/L (ref 101–111)
Creatinine, Ser: 0.7 mg/dL (ref 0.61–1.24)
Glucose, Bld: 234 mg/dL — ABNORMAL HIGH (ref 65–99)
HEMATOCRIT: 51 % (ref 39.0–52.0)
Hemoglobin: 17.3 g/dL — ABNORMAL HIGH (ref 13.0–17.0)
Potassium: 4 mmol/L (ref 3.5–5.1)
SODIUM: 139 mmol/L (ref 135–145)
TCO2: 25 mmol/L (ref 0–100)

## 2016-02-08 LAB — PROTIME-INR
INR: 0.98 (ref 0.00–1.49)
PROTHROMBIN TIME: 13.2 s (ref 11.6–15.2)

## 2016-02-08 NOTE — ED Provider Notes (Addendum)
CSN: [id number]     Arrival date & time 02/08/16  1958 History  By signing my name below, I, Marisue HumbleMichelle Chaffee, attest that this documentation has been prepared under the direction and in the presence of Shon Batonourtney F Jacolby Risby, MD . Electronically Signed: Marisue HumbleMichelle Chaffee, Scribe. 02/09/2016. 12:05 AM.   Chief Complaint  Patient presents with  . Hyperglycemia  . Hypertension   The history is provided by the patient and a relative. No language interpreter was used.   HPI Comments:  Germain OsgoodJohn W Bucher is a 60 y.o. male with PMHx of DM who presents to the Emergency Department via EMS complaining of intermittent headache and sinus congestion for the past 2-3 days. Headache is currently alleviated. Pt reports associated right-sided facial numbness for 2 days, currently alleviated, and sensation of a foreign body in his right eye for the past few days. No alleviating factors noted. He does not currently take medication for diabetes or hypertension. Pt is a current smoker. Denies weakness, dizziness, facial droop, speech difficulty, difficulty moving arms or legs, cough, or fever.  Past Medical History  Diagnosis Date  . Headache(784.0)   . Diabetes mellitus without complication (HCC)    History reviewed. No pertinent past surgical history. History reviewed. No pertinent family history. Social History  Substance Use Topics  . Smoking status: Current Every Day Smoker -- 1.00 packs/day for 44 years    Types: Cigarettes  . Smokeless tobacco: None  . Alcohol Use: Yes    Review of Systems  Constitutional: Negative for fever.  HENT: Positive for congestion.   Eyes:       Foreign body sensation  Respiratory: Negative for cough.   Neurological: Positive for numbness and headaches. Negative for dizziness, facial asymmetry, speech difficulty, weakness and light-headedness.  All other systems reviewed and are negative.  Allergies  Review of patient's allergies indicates no known allergies.  Home  Medications   Prior to Admission medications   Medication Sig Start Date End Date Taking? Authorizing Provider  lisinopril (PRINIVIL) 10 MG tablet Take 2 tablets (20 mg total) by mouth daily. Patient not taking: Reported on 02/09/2016 01/22/12 02/09/16  Dorothea OgleIskra M Myers, MD  metFORMIN (GLUCOPHAGE) 500 MG tablet Take 1 tablet (500 mg total) by mouth 2 (two) times daily with a meal. Patient not taking: Reported on 02/09/2016 01/22/12 02/09/16  Dorothea OgleIskra M Myers, MD   BP 185/97 mmHg  Pulse 102  Temp(Src) 99.3 F (37.4 C) (Oral)  Resp 17  SpO2 92% Physical Exam  Constitutional: He is oriented to person, place, and time. No distress.  HENT:  Head: Normocephalic and atraumatic.  Mouth/Throat: Oropharynx is clear and moist.  Eyes: EOM are normal. Pupils are equal, round, and reactive to light.  Cardiovascular: Normal rate, regular rhythm and normal heart sounds.   No murmur heard. Pulmonary/Chest: Effort normal and breath sounds normal. No respiratory distress. He has no wheezes.  Abdominal: Soft. Bowel sounds are normal. There is no tenderness. There is no rebound.  Musculoskeletal: He exhibits no edema.  Neurological: He is alert and oriented to person, place, and time.  Asymmetric smile, blinks his right eye involuntarily, no nasolabial fold blunting on right, droop on the left that spares the forehead, fluent speech, no drift, 5 out of 5 strength in all 4 extremities  Skin: Skin is warm and dry.  Psychiatric: He has a normal mood and affect.  Nursing note and vitals reviewed.   ED Course  Procedures  DIAGNOSTIC STUDIES:  Oxygen Saturation is 94%  on RA, adequate by my interpretation.    COORDINATION OF CARE:  12:03 AM Discussed treatment plan with pt at bedside and pt agreed to plan. 12:23 AM Consult with neurology; will order MRI.  Labs Review Labs Reviewed  URINALYSIS, ROUTINE W REFLEX MICROSCOPIC (NOT AT [facility]) - Abnormal; Notable for the following:    Glucose, UA >1000 (*)    Protein,  ur 100 (*)    All other components within normal limits  COMPREHENSIVE METABOLIC PANEL - Abnormal; Notable for the following:    Sodium 134 (*)    Glucose, Bld 241 (*)    Total Protein 6.4 (*)    Albumin 3.3 (*)    All other components within normal limits  URINE MICROSCOPIC-ADD ON - Abnormal; Notable for the following:    Squamous Epithelial / LPF 0-5 (*)    All other components within normal limits  CBG MONITORING, ED - Abnormal; Notable for the following:    Glucose-Capillary 221 (*)    All other components within normal limits  CBG MONITORING, ED - Abnormal; Notable for the following:    Glucose-Capillary 201 (*)    All other components within normal limits  I-STAT CHEM 8, ED - Abnormal; Notable for the following:    Glucose, Bld 234 (*)    Hemoglobin 17.3 (*)    All other components within normal limits  PROTIME-INR  APTT  CBC  DIFFERENTIAL  I-STAT TROPOININ, ED  CBG MONITORING, ED    Imaging Review Ct Head Wo Contrast  02/08/2016  CLINICAL DATA:  Acute onset of right facial numbness at the lips. Initial encounter. EXAM: CT HEAD WITHOUT CONTRAST TECHNIQUE: Contiguous axial images were obtained from the base of the skull through the vertex without intravenous contrast. COMPARISON:  MRI/ MRA of the brain performed 01/22/2012, and CT of the head performed 01/21/2012 FINDINGS: There is no evidence of acute infarction, mass lesion, or intra- or extra-axial hemorrhage on CT. The posterior fossa, including the cerebellum, brainstem and fourth ventricle, is within normal limits. The third and lateral ventricles, and basal ganglia are unremarkable in appearance. The cerebral hemispheres are symmetric in appearance, with normal gray-white differentiation. No mass effect or midline shift is seen. There is no evidence of fracture; visualized osseous structures are unremarkable in appearance. The orbits are within normal limits. The paranasal sinuses and mastoid air cells are well-aerated. No  significant soft tissue abnormalities are seen. IMPRESSION: Unremarkable noncontrast CT of the head. Electronically Signed   By: Roanna Raider M.D.   On: 02/08/2016 21:15   Mr Brain Wo Contrast  02/09/2016  CLINICAL DATA:  Initial evaluation for right-sided facial numbness for 2 days. EXAM: MRI HEAD WITHOUT CONTRAST TECHNIQUE: Multiplanar, multiecho pulse sequences of the brain and surrounding structures were obtained without intravenous contrast. COMPARISON:  Prior CT from 02/08/2016. FINDINGS: Study is moderately degraded by motion artifact. Age appropriate cerebral atrophy present. Mild scattered T2/FLAIR hyperintensity within the periventricular and deep white matter both cerebral hemispheres present, most likely related to mild chronic small vessel ischemic disease. Small remote right cerebellar infarct present. There is a tiny 7 mm focus of high signal intensity on axial DWI sequence in the region of the left internal capsule (series 8, image 28), suspicious for a possible small acute ischemic infarct. This is faintly seen on corresponding coronal DWI sequence. No associated hemorrhage or mass effect. No other acute infarct identified. Gray-white matter differentiation maintained. Major intracranial vascular flow voids preserved. No acute or chronic intracranial hemorrhage. No mass  lesion, midline shift, or mass effect. No hydrocephalus. No extra-axial fluid collection. Major dural sinuses are grossly patent. Craniocervical junction within normal limits. Visualized upper cervical spine unremarkable. Pituitary gland within normal limits. No acute abnormality about the orbits. Mild scattered mucosal thickening within the ethmoidal air cells. Paranasal sinuses are otherwise clear. Trace opacity within the right mastoid air cells. Left mastoid air cells are clear. Inner ear structures grossly normal. Bone marrow signal intensity within normal limits. No scalp soft tissue abnormality. IMPRESSION: 1. Mildly  hyperintense 7 mm focus of diffusion abnormality within the left internal capsule, suspicious for a small acute/early subacute ischemic infarct. No associated hemorrhage or mass effect. 2. No other acute intracranial process identified. 3. Small remote right cerebellar infarct. Electronically Signed   By: Rise Mu M.D.   On: 02/09/2016 05:08   I have personally reviewed and evaluated these images and lab results as part of my medical decision-making.   EKG Interpretation ED ECG REPORT   Date: 02/09/2016  Rate: 77  Rhythm: normal sinus rhythm  QRS Axis: normal  Intervals: normal  ST/T Wave abnormalities: normal  Conduction Disutrbances:none  Narrative Interpretation:   Old EKG Reviewed: none available  I have personally reviewed the EKG tracing and agree with the computerized printout as noted.     MDM   Final diagnoses:  Cerebrovascular accident (CVA) due to other mechanism [facility])  Essential hypertension  Hyperglycemia    Patient presents with right lip numbness, headache, and was noted to have an asymmetric smile by family member. Symptoms have been ongoing for the last 2 days. Denies any other focal deficits. Noted to be hypertensive. Lab work notable for hyperglycemia without an anion gap. Physical exam is notable for asymmetric smile and involuntary twitching of the right eye. He is otherwise neurologically intact. Stroke is certainly a consideration. Comp located migraine is another consideration. Given how hypertensive the patient is, CT scan was obtained and shows no evidence of bleed. He was given hydralazine and blood pressure improving. Will obtain MRI. Discussed with Dr. Roseanne Reno. He will evaluate the patient if MRI is positive. MRI does show a small focus in the left internal capsule concerning for acute stroke. Patient certainly has risk factors. Discussed again with Dr. Roseanne Reno. He will evaluate the patient. Will admit for stroke workup. Initially, patient was  hesitant for admission and stated "I have stuff to do." I discussed with the patient if he goes home he is at increased risk for a subsequent stroke that could leave him with deficits and/or caused death. Patient's family has urged the patient to stay. After consultation, patient has agreed to stay.  I personally performed the services described in this documentation, which was scribed in my presence. The recorded information has been reviewed and is accurate.    Shon Baton, MD 02/09/16 1610  Shon Baton, MD 02/09/16 [id number]  Shon Baton, MD 02/09/16 [phone]

## 2016-02-08 NOTE — ED Notes (Signed)
EMS gave of NS and CBG was 260.  Retaken here and was 221

## 2016-02-08 NOTE — ED Notes (Signed)
CBG was 201, RN notified

## 2016-02-08 NOTE — ED Notes (Signed)
Patients comes via EMS complaining of a headache and right lip numbness for the past 2 days.  Woke up today with left ear pain.  Hand grips and movement all normal.  Patient also has hyperglycemia hat he is currently not taking any medications for.  Patient A&Ox4

## 2016-02-09 ENCOUNTER — Encounter (HOSPITAL_COMMUNITY): Payer: Self-pay | Admitting: Internal Medicine

## 2016-02-09 ENCOUNTER — Inpatient Hospital Stay (HOSPITAL_COMMUNITY): Payer: MEDICAID

## 2016-02-09 ENCOUNTER — Emergency Department (HOSPITAL_COMMUNITY): Payer: Self-pay

## 2016-02-09 ENCOUNTER — Inpatient Hospital Stay (HOSPITAL_COMMUNITY): Payer: Self-pay

## 2016-02-09 ENCOUNTER — Ambulatory Visit (HOSPITAL_COMMUNITY): Payer: MEDICAID

## 2016-02-09 DIAGNOSIS — G459 Transient cerebral ischemic attack, unspecified: Secondary | ICD-10-CM

## 2016-02-09 DIAGNOSIS — Z9114 Patient's other noncompliance with medication regimen: Secondary | ICD-10-CM

## 2016-02-09 DIAGNOSIS — G510 Bell's palsy: Secondary | ICD-10-CM

## 2016-02-09 DIAGNOSIS — E118 Type 2 diabetes mellitus with unspecified complications: Secondary | ICD-10-CM

## 2016-02-09 DIAGNOSIS — I6350 Cerebral infarction due to unspecified occlusion or stenosis of unspecified cerebral artery: Secondary | ICD-10-CM

## 2016-02-09 DIAGNOSIS — I638 Other cerebral infarction: Secondary | ICD-10-CM

## 2016-02-09 DIAGNOSIS — R51 Headache: Secondary | ICD-10-CM

## 2016-02-09 DIAGNOSIS — E119 Type 2 diabetes mellitus without complications: Secondary | ICD-10-CM

## 2016-02-09 DIAGNOSIS — Z720 Tobacco use: Secondary | ICD-10-CM

## 2016-02-09 DIAGNOSIS — I10 Essential (primary) hypertension: Secondary | ICD-10-CM

## 2016-02-09 DIAGNOSIS — E669 Obesity, unspecified: Secondary | ICD-10-CM | POA: Diagnosis present

## 2016-02-09 DIAGNOSIS — R739 Hyperglycemia, unspecified: Secondary | ICD-10-CM | POA: Insufficient documentation

## 2016-02-09 DIAGNOSIS — I639 Cerebral infarction, unspecified: Secondary | ICD-10-CM | POA: Insufficient documentation

## 2016-02-09 DIAGNOSIS — I63312 Cerebral infarction due to thrombosis of left middle cerebral artery: Secondary | ICD-10-CM

## 2016-02-09 LAB — LIPID PANEL
CHOL/HDL RATIO: 5.2 ratio
CHOLESTEROL: 219 mg/dL — AB (ref 0–200)
HDL: 42 mg/dL (ref >40)
LDL Cholesterol: 136 mg/dL — ABNORMAL HIGH (ref 0–99)
TRIGLYCERIDES: 205 mg/dL — AB (ref <150)
VLDL: 41 mg/dL — AB (ref 0–40)

## 2016-02-09 LAB — GLUCOSE, CAPILLARY
GLUCOSE-CAPILLARY: 310 mg/dL — AB (ref 65–99)
GLUCOSE-CAPILLARY: 269 mg/dL — AB (ref 65–99)
GLUCOSE-CAPILLARY: 372 mg/dL — AB (ref 65–99)
Glucose-Capillary: 259 mg/dL — ABNORMAL HIGH (ref 65–99)

## 2016-02-09 LAB — ECHOCARDIOGRAM COMPLETE

## 2016-02-09 MED ORDER — ASPIRIN 325 MG PO TABS
325.0000 mg | ORAL_TABLET | Freq: Every day | ORAL | Status: DC
  Administered 2016-02-09 – 2016-02-10 (×2): 325 mg via ORAL
  Filled 2016-02-09 (×2): qty 1

## 2016-02-09 MED ORDER — ARTIFICIAL TEARS OP OINT
TOPICAL_OINTMENT | Freq: Every evening | OPHTHALMIC | Status: DC | PRN
  Filled 2016-02-09: qty 3.5

## 2016-02-09 MED ORDER — NICOTINE 21 MG/24HR TD PT24
21.0000 mg | MEDICATED_PATCH | Freq: Every day | TRANSDERMAL | Status: DC
Start: 2016-02-09 — End: 2016-02-10
  Administered 2016-02-09 – 2016-02-10 (×2): 21 mg via TRANSDERMAL
  Filled 2016-02-09 (×2): qty 1

## 2016-02-09 MED ORDER — ALBUTEROL SULFATE (2.5 MG/3ML) 0.083% IN NEBU: 2.5000 mg | INHALATION_SOLUTION | RESPIRATORY_TRACT | Status: DC | PRN

## 2016-02-09 MED ORDER — INSULIN ASPART 100 UNIT/ML ~~LOC~~ SOLN
0.0000 [IU] | Freq: Every day | SUBCUTANEOUS | Status: DC
  Administered 2016-02-09: 5 [IU] via SUBCUTANEOUS

## 2016-02-09 MED ORDER — LIVING WELL WITH DIABETES BOOK
Freq: Once | Status: AC
  Administered 2016-02-09: 12:00:00
  Filled 2016-02-09: qty 1

## 2016-02-09 MED ORDER — INSULIN ASPART 100 UNIT/ML ~~LOC~~ SOLN
0.0000 [IU] | Freq: Three times a day (TID) | SUBCUTANEOUS | Status: DC
  Administered 2016-02-09 (×2): 8 [IU] via SUBCUTANEOUS
  Administered 2016-02-09: 11 [IU] via SUBCUTANEOUS
  Administered 2016-02-10: 3 [IU] via SUBCUTANEOUS
  Administered 2016-02-10: 5 [IU] via SUBCUTANEOUS

## 2016-02-09 MED ORDER — HYDRALAZINE HCL 20 MG/ML IJ SOLN
5.0000 mg | Freq: Once | INTRAMUSCULAR | Status: AC
  Administered 2016-02-09: 5 mg via INTRAVENOUS
  Filled 2016-02-09: qty 1

## 2016-02-09 MED ORDER — PREDNISONE 50 MG PO TABS
60.0000 mg | ORAL_TABLET | Freq: Every day | ORAL | Status: DC
  Administered 2016-02-09 – 2016-02-10 (×2): 60 mg via ORAL
  Filled 2016-02-09 (×2): qty 1

## 2016-02-09 MED ORDER — ASPIRIN 300 MG RE SUPP: 300.0000 mg | Freq: Every day | RECTAL | Status: DC

## 2016-02-09 MED ORDER — ATORVASTATIN CALCIUM 10 MG PO TABS
10.0000 mg | ORAL_TABLET | Freq: Every day | ORAL | Status: DC
  Administered 2016-02-09: 10 mg via ORAL
  Filled 2016-02-09: qty 1

## 2016-02-09 MED ORDER — ENOXAPARIN SODIUM 40 MG/0.4ML ~~LOC~~ SOLN
40.0000 mg | SUBCUTANEOUS | Status: DC
  Administered 2016-02-09 – 2016-02-10 (×2): 40 mg via SUBCUTANEOUS
  Filled 2016-02-09 (×3): qty 0.4

## 2016-02-09 MED ORDER — ACYCLOVIR 200 MG PO CAPS
1000.0000 mg | ORAL_CAPSULE | Freq: Every day | ORAL | Status: DC
  Filled 2016-02-09: qty 5

## 2016-02-09 MED ORDER — HYDRALAZINE HCL 20 MG/ML IJ SOLN: 5.0000 mg | INTRAMUSCULAR | Status: DC | PRN

## 2016-02-09 MED ORDER — ACYCLOVIR 800 MG PO TABS
1000.0000 mg | ORAL_TABLET | Freq: Every day | ORAL | Status: DC
  Filled 2016-02-09: qty 0.5

## 2016-02-09 MED ORDER — STROKE: EARLY STAGES OF RECOVERY BOOK
Freq: Once | Status: AC
  Administered 2016-02-09: 08:00:00
  Filled 2016-02-09: qty 1

## 2016-02-09 MED ORDER — VALACYCLOVIR HCL 500 MG PO TABS
1000.0000 mg | ORAL_TABLET | Freq: Three times a day (TID) | ORAL | Status: DC
  Administered 2016-02-09 – 2016-02-10 (×3): 1000 mg via ORAL
  Filled 2016-02-09 (×3): qty 2

## 2016-02-09 MED ORDER — HYPROMELLOSE (GONIOSCOPIC) 2.5 % OP SOLN
2.0000 [drp] | OPHTHALMIC | Status: DC | PRN
  Filled 2016-02-09: qty 15

## 2016-02-09 MED ORDER — NICOTINE POLACRILEX 2 MG MT GUM
2.0000 mg | CHEWING_GUM | OROMUCOSAL | Status: DC | PRN
  Filled 2016-02-09: qty 1

## 2016-02-09 MED ORDER — LORAZEPAM 2 MG/ML IJ SOLN
1.0000 mg | Freq: Once | INTRAMUSCULAR | Status: AC
  Administered 2016-02-09: 1 mg via INTRAVENOUS
  Filled 2016-02-09: qty 1

## 2016-02-09 MED ORDER — ATORVASTATIN CALCIUM 40 MG PO TABS
40.0000 mg | ORAL_TABLET | Freq: Every day | ORAL | Status: DC
Start: 2016-02-10 — End: 2016-02-10

## 2016-02-09 MED ORDER — SENNOSIDES-DOCUSATE SODIUM 8.6-50 MG PO TABS
1.0000 | ORAL_TABLET | Freq: Every evening | ORAL | Status: DC | PRN
Start: 2016-02-09 — End: 2016-02-10
  Filled 2016-02-09: qty 1

## 2016-02-09 MED ORDER — ALBUTEROL SULFATE (2.5 MG/3ML) 0.083% IN NEBU
2.5000 mg | INHALATION_SOLUTION | RESPIRATORY_TRACT | Status: DC
  Administered 2016-02-09: 2.5 mg via RESPIRATORY_TRACT
  Filled 2016-02-09 (×4): qty 3

## 2016-02-09 NOTE — Progress Notes (Signed)
Inpatient Diabetes Program Recommendations  AACE/ADA: New Consensus Statement on Inpatient Glycemic Control (2015)  Target Ranges:  Prepandial:   less than 140 mg/dL      Peak postprandial:   less than 180 mg/dL (1-2 hours)      Critically ill patients:  140 - 180 mg/dL   Results for Germain OsgoodCOMBS, Ramzy W (MRN [id number]) as of 02/09/2016 14:31  Ref. Range 02/08/2016 20:11 02/08/2016 23:57 02/09/2016 10:42 02/09/2016 12:34  Glucose-Capillary Latest Ref Range: 65-99 mg/dL 409221 (H) 811201 (H) 914310 (H) 259 (H)    Admit with: CVA  History: DM, CKD, CHF  Home DM Meds: Metformin 500 mg bid (patient not taking per report)  Current Insulin Orders: Novolog Moderate Correction Scale/ SSI (0-15 units) TID AC + HS     -Attempted to speak with patient today however patient was in procedure this afternoon.  -Unsure why patient is not taking Metformin at home.  Does not appear to have PCP nor health insurance coverage.  -Note current A1c in process.     MD- If patient continues to have elevated glucose levels, please consider the following:  1. Start Lantus 17 units daily (0.2 units/kg dosing)  2. Start Novolog Meal Coverage- Novolog 4 units tid with meals     --Will follow patient during hospitalization--  Ambrose FinlandJeannine Johnston Yulanda Diggs RN, MSN, CDE Diabetes Coordinator Inpatient Glycemic Control Team Team Pager: [phone] (8a-5p)'

## 2016-02-09 NOTE — Progress Notes (Signed)
Echocardiogram 2D Echocardiogram has been performed.  Jonathan Miller 02/09/2016, 3:03 PM

## 2016-02-09 NOTE — Progress Notes (Signed)
STROKE TEAM PROGRESS NOTE   HISTORY OF PRESENT ILLNESS (per record) Jonathan Miller is an 60 y.o. male history of hypertension and diabetes mellitus who presented to the ED with complaint of sensory changes with numbness and tingling involving left side of his face as well as a short droop noted by family members. Onset of sensory changes was 2 days prior to presentation. Facial droop was on the day prior to coming to the ED. He is also been complaining of headache involving left side of his head and involving his left ear. No speech change has been noted. He has not experienced sensorineural motor changes involving the extremities. CT scan of his head showed no acute findings. MRI showed a mildly hyperintense 7 mm focus of diffusion abnormality involving left internal capsule suspicious for acute infarction. He was LKW 02/06/2016, time unknown. Patient was not administered IV t-PA secondary to delay in arrival. He was admitted for further evaluation and treatment.   SUBJECTIVE (INTERVAL HISTORY) His son is at the bedside.  Overall he feels his condition is stable. He still has left bell's palsy but no right sided symptoms. MRI showed left IC infarct not very convinced.    OBJECTIVE Temp:  [98.3 F (36.8 C)-99.3 F (37.4 C)] 98.3 F (36.8 C) (05/22 0858) Pulse Rate:  [74-102] 87 (05/22 0858) Cardiac Rhythm:  [-] Normal sinus rhythm (05/22 0929) Resp:  [11-23] 18 (05/22 0858) BP: (166-196)/(82-106) 188/98 mmHg (05/22 0858) SpO2:  [91 %-95 %] 91 % (05/22 0858)  CBC:  Recent Labs Lab 02/08/16 2029 02/08/16 2040  WBC 8.5  --   NEUTROABS 5.6  --   HGB 15.7 17.3*  HCT 48.1 51.0  MCV 93.0  --   PLT 282  --     Basic Metabolic Panel:  Recent Labs Lab 02/08/16 2029 02/08/16 2040  NA 134* 139  K 3.8 4.0  CL 101 101  CO2 25  --   GLUCOSE 241* 234*  BUN 15 18  CREATININE 0.80 0.70  CALCIUM 9.0  --     Lipid Panel:    Component Value Date/Time   CHOL 219* 02/09/2016 0731   TRIG  205* 02/09/2016 0731   HDL 42 02/09/2016 0731   CHOLHDL 5.2 02/09/2016 0731   VLDL 41* 02/09/2016 0731   LDLCALC 136* 02/09/2016 0731   HgbA1c:  Lab Results  Component Value Date   HGBA1C 9.7* 01/21/2012   Urine Drug Screen:    Component Value Date/Time   LABOPIA NONE DETECTED 01/22/2012 0143   COCAINSCRNUR NONE DETECTED 01/22/2012 0143   LABBENZ NONE DETECTED 01/22/2012 0143   AMPHETMU NONE DETECTED 01/22/2012 0143   THCU NONE DETECTED 01/22/2012 0143   LABBARB NONE DETECTED 01/22/2012 0143      IMAGING I have personally reviewed the radiological images below and agree with the radiology interpretations.  Dg Chest 2 View 02/09/2016  COPD. There is no evidence of pneumonia, CHF, nor other acute cardiopulmonary abnormality.   Ct Head Wo Contrast 02/08/2016  Unremarkable noncontrast CT of the head.  Mr Brain Wo Contrast 02/09/2016  1. Mildly hyperintense 7 mm focus of diffusion abnormality within the left internal capsule, suspicious for a small acute/early subacute ischemic infarct. No associated hemorrhage or mass effect. 2. No other acute intracranial process identified. 3. Small remote right cerebellar infarct.   Mr Maxine Glenn Head/brain Wo Cm 02/09/2016  1. No acute arterial finding. 2. Apparent moderate cavernous left ICA narrowing is likely artifactual. No major branch occlusion. 3. Motion degraded  study.   CUS - Bilateral: 1-39% ICA stenosis. Vertebral artery flow is antegrade.  TTE - - Left ventricle: The cavity size was normal. Wall thickness was  normal. Systolic function was normal. The estimated ejection  fraction was in the range of 60% to 65%. Doppler parameters are  consistent with abnormal left ventricular relaxation (grade 1  diastolic dysfunction).   PHYSICAL EXAM  Temp:  [98.2 F (36.8 C)-99 F (37.2 C)] 98.9 F (37.2 C) (05/22 2106) Pulse Rate:  [77-119] 119 (05/22 2106) Resp:  [11-23] 18 (05/22 2106) BP: (155-188)/(82-99) 161/86 mmHg (05/22  2106) SpO2:  [89 %-95 %] 92 % (05/22 2106)  General - Well nourished, well developed, in no apparent distress.  Ophthalmologic - Fundi not visualized due to eye movement.  Cardiovascular - Regular rate and rhythm.  Neck - pt complains of left posterior ear pain with tenderness to palpation.   Mental Status -  Level of arousal and orientation to time, place, and person were intact. Language including expression, naming, repetition, comprehension was assessed and found intact. Fund of Knowledge was assessed and was intact.  Cranial Nerves II - XII - II - Visual field intact OU. III, IV, VI - Extraocular movements intact. V - Facial sensation intact bilaterally. VII - left facial droop, left eye weak close and decreased blinking. VIII - Hearing & vestibular intact bilaterally. X - Palate elevates symmetrically, pt stated "taste funny". XI - Chin turning & shoulder shrug intact bilaterally. XII - Tongue protrusion intact.  Motor Strength - The patient's strength was normal in all extremities and pronator drift was absent.  Bulk was normal and fasciculations were absent.   Motor Tone - Muscle tone was assessed at the neck and appendages and was normal.  Reflexes - The patient's reflexes were 1+ in all extremities and he had no pathological reflexes.  Sensory - Light touch, temperature/pinprick were assessed and were symmetrical.    Coordination - The patient had normal movements in the hands and feet with no ataxia or dysmetria.  Tremor was absent.  Gait and Station - not tested as pt is undergoing TTE test.   ASSESSMENT/PLAN Mr. ERRIC MACHNIK is a 60 y.o. male with history of hypertension and diabetes mellitus presenting with headache and left facial droop and numbness. He did not receive IV t-PA due to delay in arrival.   Bell's Palsy - left  Symptoms typical for left bell's palsy  Pt stated that he had same side bell's palsy in 2013 and recovered in 3 days  Put on  prednisone  daily x 7  Put on valacyclovir 1g tid x 7  Eye drop or ointment RRN for left eye protection.    Questionable stroke:  Left internal capsule abnormal DWI signal, small vessel disease source  Resultant  HA resolved  MRI  left internal capsule DWI signal change  MRA  No acute finding  Carotid Doppler  unremarkable  2D Echo  unremarkable  LDL 136  HgbA1c pending  Lovenox 40 mg sq daily for VTE prophylaxis  Diet Heart Room service appropriate?: Yes; Fluid consistency:: Thin  No antithrombotic prior to admission, now on aspirin 325 mg daily. Continue ASA on discharge.  Patient counseled to be compliant with his antithrombotic medications  Ongoing aggressive stroke risk factor management  Therapy recommendations:  pending  Disposition:  Likely home  Hypertension  Elevated but Stable  Permissive hypertension (OK if < 220/120) but gradually normalize in 5-7 days  Hyperlipidemia  Home meds:  No statin  LDL 136, goal < 70  Add lipitor 40mg    Continue statin at discharge  Diabetes  HgbA1c pending, goal < 7.0  Uncontrolled  CBG high  Now also on prednisone  Need close monitoring and control  Tobacco abuse  Current smoker  Smoking cessation counseling provided  Nicotine patch provided  Pt is willing to quit  Other Stroke Risk Factors  ETOH use  Other Active Problems  Hx noncompliance with medications and OP followup  Hospital day # 0  Neurology will sign off. Please call with questions. [facility]. Thanks for the consult.  Marvel PlanJindong Indie Boehne, MD PhD Stroke Neurology 02/09/2016 11:56 PM     To contact Stroke Continuity provider, please refer to [email or web address]. After hours, contact General Neurology

## 2016-02-09 NOTE — H&P (Signed)
[facility] History and Physical  Jonathan OsgoodJohn W Borthwick ZOX:[id number] DOB: 08/03/1956 DOA: 02/08/2016  Referring physician: Toniann Failkakrakandy PCP: Henri MedalMOREIRA,FERNANDA I, MD   Chief Complaint: headache and right sided facial numbness  HPI: Jonathan Miller is a somewhat contrary 60 y.o. male medical history that includes diabetes hypertension obesity tobacco use and noncompliance with all the above presents to the emergency department with the chief complaint of headache and right-sided facial numbness for 2 days. Initial evaluation includes an MRI brain revealing small acute/subacute infarct.  Information is obtained from the patient and the son who is at the bedside. Patient reports intermittent headaches and sinus congestion for the previous 2-3 days. He came to the emergency department when he developed right-sided facial numbness and right eye irritation. He denies visual disturbances numbness or tingling of extremities. He denies difficulty chewing or swallowing unsteady gait. He denies chest pain palpitation shortness of breath diaphoresis. He denies abdominal pain nausea vomiting. He denies dysuria hematuria frequency or urgency. He denies lower extremity edema or orthopnea.  In the emergency department is afebrile hypertensive and not hypoxic. He is provided with 1 dose of hydralazine.    Review of Systems:  10 point review of systems complete and all systems are negative except as indicated in the history of present illness Past Medical History  Diagnosis Date  . Headache(784.0)   . Diabetes mellitus without complication (HCC)    History reviewed. No pertinent past surgical history. Social History:  reports that he has been smoking Cigarettes.  He has a 44 pack-year smoking history. He does not have any smokeless tobacco history on file. He reports that he drinks alcohol. His drug history is not on file.  Patient lives at home with his son he is currently unemployed he is independent with ADLs No  Known Allergies  History reviewed. No pertinent family history.   Prior to Admission medications   Medication Sig Start Date End Date Taking? Authorizing Provider  lisinopril (PRINIVIL) 10 MG tablet Take 2 tablets (20 mg total) by mouth daily. Patient not taking: Reported on 02/09/2016 01/22/12 02/09/16  Dorothea OgleIskra M Myers, MD  metFORMIN (GLUCOPHAGE) 500 MG tablet Take 1 tablet (500 mg total) by mouth 2 (two) times daily with a meal. Patient not taking: Reported on 02/09/2016 01/22/12 02/09/16  Dorothea OgleIskra M Myers, MD   Physical Exam: Filed Vitals:   02/09/16 0515 02/09/16 0530 02/09/16 0600 02/09/16 0700  BP:  185/97 174/99 175/96  Pulse: 92 102 92 79  Temp:      TempSrc:      Resp: 23 17 17 18   SpO2: 94% 92%  93%    Wt Readings from Last 3 Encounters:  02/04/13 86.637 kg (191 lb)  01/21/12 96.5 kg (212 lb 11.9 oz)    General:  Appears Somewhat irritable but comfortable, obese Eyes: PERRL, normal lids, irises & conjunctiva, left eye weak blink ENT: grossly normal hearing, lips & tongue Neck: no LAD, masses or thyromegaly Cardiovascular: RRR, no m/r/g. Trace LE edema.  Respiratory:  Normal respiratory effort. Sounds somewhat distant with diffuse wheezing Abdomen: soft, ntnd, base positive bowel sounds throughout no guarding Skin: no rash or induration seen on limited exam Musculoskeletal: grossly normal tone BUE/BLE, joints without swelling/erythema Psychiatric: grossly normal mood and affect, speech fluent and appropriate Neurologic: Speech clear facial droop tongue midline no pronator drift bilateral grip 5 out of 5 extremity strength 5 out of 5           Labs on Admission:  Basic  Metabolic Panel:  Recent Labs Lab 02/08/16 2029 02/08/16 2040  NA 134* 139  K 3.8 4.0  CL 101 101  CO2 25  --   GLUCOSE 241* 234*  BUN 15 18  CREATININE 0.80 0.70  CALCIUM 9.0  --    Liver Function Tests:  Recent Labs Lab 02/08/16 2029  AST 20  ALT 21  ALKPHOS 64  BILITOT 0.4  PROT 6.4*    ALBUMIN 3.3*   No results for input(s): LIPASE, AMYLASE in the last 168 hours. No results for input(s): AMMONIA in the last 168 hours. CBC:  Recent Labs Lab 02/08/16 2029 02/08/16 2040  WBC 8.5  --   NEUTROABS 5.6  --   HGB 15.7 17.3*  HCT 48.1 51.0  MCV 93.0  --   PLT 282  --    Cardiac Enzymes: No results for input(s): CKTOTAL, CKMB, CKMBINDEX, TROPONINI in the last 168 hours.  BNP (last 3 results) No results for input(s): BNP in the last 8760 hours.  ProBNP (last 3 results) No results for input(s): PROBNP in the last 8760 hours.  CBG:  Recent Labs Lab 02/08/16 2011 02/08/16 2357  GLUCAP 221* 201*    Radiological Exams on Admission: Ct Head Wo Contrast  02/08/2016  CLINICAL DATA:  Acute onset of right facial numbness at the lips. Initial encounter. EXAM: CT HEAD WITHOUT CONTRAST TECHNIQUE: Contiguous axial images were obtained from the base of the skull through the vertex without intravenous contrast. COMPARISON:  MRI/ MRA of the brain performed 01/22/2012, and CT of the head performed 01/21/2012 FINDINGS: There is no evidence of acute infarction, mass lesion, or intra- or extra-axial hemorrhage on CT. The posterior fossa, including the cerebellum, brainstem and fourth ventricle, is within normal limits. The third and lateral ventricles, and basal ganglia are unremarkable in appearance. The cerebral hemispheres are symmetric in appearance, with normal gray-white differentiation. No mass effect or midline shift is seen. There is no evidence of fracture; visualized osseous structures are unremarkable in appearance. The orbits are within normal limits. The paranasal sinuses and mastoid air cells are well-aerated. No significant soft tissue abnormalities are seen. IMPRESSION: Unremarkable noncontrast CT of the head. Electronically Signed   By: Roanna Raider M.D.   On: 02/08/2016 21:15   Mr Brain Wo Contrast  02/09/2016  CLINICAL DATA:  Initial evaluation for right-sided  facial numbness for 2 days. EXAM: MRI HEAD WITHOUT CONTRAST TECHNIQUE: Multiplanar, multiecho pulse sequences of the brain and surrounding structures were obtained without intravenous contrast. COMPARISON:  Prior CT from 02/08/2016. FINDINGS: Study is moderately degraded by motion artifact. Age appropriate cerebral atrophy present. Mild scattered T2/FLAIR hyperintensity within the periventricular and deep white matter both cerebral hemispheres present, most likely related to mild chronic small vessel ischemic disease. Small remote right cerebellar infarct present. There is a tiny 7 mm focus of high signal intensity on axial DWI sequence in the region of the left internal capsule (series 8, image 28), suspicious for a possible small acute ischemic infarct. This is faintly seen on corresponding coronal DWI sequence. No associated hemorrhage or mass effect. No other acute infarct identified. Gray-white matter differentiation maintained. Major intracranial vascular flow voids preserved. No acute or chronic intracranial hemorrhage. No mass lesion, midline shift, or mass effect. No hydrocephalus. No extra-axial fluid collection. Major dural sinuses are grossly patent. Craniocervical junction within normal limits. Visualized upper cervical spine unremarkable. Pituitary gland within normal limits. No acute abnormality about the orbits. Mild scattered mucosal thickening within the ethmoidal air  cells. Paranasal sinuses are otherwise clear. Trace opacity within the right mastoid air cells. Left mastoid air cells are clear. Inner ear structures grossly normal. Bone marrow signal intensity within normal limits. No scalp soft tissue abnormality. IMPRESSION: 1. Mildly hyperintense 7 mm focus of diffusion abnormality within the left internal capsule, suspicious for a small acute/early subacute ischemic infarct. No associated hemorrhage or mass effect. 2. No other acute intracranial process identified. 3. Small remote right  cerebellar infarct. Electronically Signed   By: Rise Mu M.D.   On: 02/09/2016 05:08    EKG: Independently reviewed reveals sinus rhythm  Assessment/Plan Principal Problem:   Stroke (cerebrum) (HCC) Active Problems:   Headache   Tobacco abuse   Bell's palsy   Diabetes (HCC)   Obesity   Non compliance w medication regimen  #1. Stroke. Risk factors include diabetes, hypertension, smoking and obesity. Patient with history of noncompliance. MRI suspicious for small acute/early subacute ischemic infarct no associated hemorrhage or mass. EKG with sinus rhythm initial troponin negative. Evaluated by neuro in the emergency department who recommends admission for stroke workup -Admit to telemetry -We will obtain a hemoglobin A1c and fasting lipid panel -Obtain echocardiogram carotid Dopplers MRA of the brain -PT consult -Start heart healthy car modified diet once he passes the bedside swallow eval -Start aspirin and statin  #2. Bell's palsy. Evaluated by neurology who noted concerning for Bell's palsy. Facial asymmetry of lip and I only.  -Hold off on prednisone for now  #3. Diabetes. Home medications include metformin. Patient reports complete noncompliance with management of diabetes serum glucose 234 on admission -Hold metformin for now -We'll obtain a hemoglobin A1c -We will use sliding scale insulin for optimal control -Diabetes coordinator for education -Case management as patient currently has no primary care provider  #4. Hypertension. Home medication list includes lisinopril 20 mg daily. Patient reports noncompliance. On presentation blood pressure 196/106. He received 1 dose of hydralazine. At time of admission blood pressure 175/96 -We'll hold lisinopril for now -monitor blood pressure closely -Consider resuming lisinopril at discharge  #5. Obesity. -Obtain weight and BMI -Nutritional consult  6. Tobacco use. Diffuse wheeze on exam. Likely copd given long hx  smoking Chest xray pending Cessation counseling offered. Patient verbalized no desire to stop at this time -follow chest xray -monitor oxygen saturation level -nebulizer every 4 hours -will likely need inhaler at discharge -may benefit OP pulmonary function tests  #7. Headache. Likely related to above. Resolved at time of admission -Monitor  #8. Noncompliance. Patient with long history of noncompliance with meds outpatient follow-up -Case management to establish PCP -Diabetes coordinator for education/encouragement -Nutritional consult for weight management   Dr Roseanne Reno neuro    Code Status: full DVT Prophylaxis: Family Communication: son at bedside Disposition Plan: home when workup complete  Time spent: 55 minutes  [facility]

## 2016-02-09 NOTE — Progress Notes (Signed)
VASCULAR LAB PRELIMINARY  PRELIMINARY  PRELIMINARY  PRELIMINARY  Carotid duplex  completed.    Preliminary report:  Bilateral:  1-39% ICA stenosis.  Vertebral artery flow is antegrade.      Maat Kafer, RVT 02/09/2016, 3:52 PM

## 2016-02-09 NOTE — Progress Notes (Signed)
Pt refusing telemetry monitor at this time.

## 2016-02-09 NOTE — ED Notes (Signed)
Attempted to call report x 1  

## 2016-02-09 NOTE — Progress Notes (Signed)
NURSING PROGRESS NOTE  Jonathan Miller [id number] Admission Data: 02/09/2016 9:18 AM Attending Provider: Eduard ClosArshad N Kakrakandy, MD BMW:UXLKGMW,NUUVOZDGPCP:MOREIRA,FERNANDA I, MD Code Status: FULL  Jonathan OsgoodJohn W Jonathan Miller is a 60 y.o. male patient admitted from ED:  -No acute distress noted.  -No complaints of shortness of breath.  -No complaints of chest pain.   Cardiac Monitoring: Box # 26 in place. Cardiac monitor yields:atrial fibrillation, with ventricular rate of 92.  Blood pressure 188/98, pulse 87, temperature 98.3 F (36.8 C), temperature source Oral, resp. rate 18, SpO2 91 %.   IV Fluids:  IV in place, occlusive dsg intact without redness, IV cath wrist right, condition patent and no redness none.   Allergies:  Review of patient's allergies indicates no known allergies.  Past Medical History:   has a past medical history of Headache(784.0); Diabetes mellitus without complication (HCC); Obesity; Tobacco abuse; and HTN (hypertension).  Past Surgical History:   has past surgical history that includes No past surgeries.  Social History:   reports that he has been smoking Cigarettes.  He has a 44 pack-year smoking history. He does not have any smokeless tobacco history on file. He reports that he drinks alcohol.  Skin: WNL  Patient/Family orientated to room. Information packet given to patient/family. Admission inpatient armband information verified with patient/family to include name and date of birth and placed on patient arm. Side rails up x 2, fall assessment and education completed with patient/family. Patient/family able to verbalize understanding of risk associated with falls and verbalized understanding to call for assistance before getting out of bed. Call light within reach. Patient/family able to voice and demonstrate understanding of unit orientation instructions. Pt states, "I'm leaving today whether the doctor lets me or not". Made pt aware that if pt leaves before d/c, he will have to sign out AMA. Pt  completely understands. Will make dr aware.    Will continue to evaluate and treat per MD orders.  Bennie Pieriniyndi Nathasha Fiorillo, RN

## 2016-02-09 NOTE — ED Notes (Signed)
Patient transported to X-ray 

## 2016-02-09 NOTE — Progress Notes (Signed)
Pt has refused breathing txs saying he does not need them.  Rt explained he could still have them if he changes his mind.  Order was changed to PRN.

## 2016-02-09 NOTE — ED Notes (Signed)
Patient transported to MRI 

## 2016-02-09 NOTE — Plan of Care (Signed)
60 year old male with history of diabetes mellitus and hypertension who has not been taking his medications presents to the ER because of right facial droop. Patient's blood pressure was elevated on admission. MRI of the brain shows stroke and neurology has been consulted. Patient is being admitted for further stroke workup.  Jonathan Miller.

## 2016-02-09 NOTE — Progress Notes (Addendum)
CM received consult regarding pt without PCP. CM spoke with pt and [facility] and medication needs, refer to AVS. Information given to pt per CM. Son @ bedside stated he would [facility]. Gae Gallopngela Daanish Copes RN,BSN,CM [phone]

## 2016-02-09 NOTE — Plan of Care (Addendum)
Problem: Food- and Nutrition-Related Knowledge Deficit (NB-1.1) Goal: Nutrition education Formal process to instruct or train a patient/client in a skill or to impart knowledge to help patients/clients voluntarily manage or modify food choices and eating behavior to maintain or improve health. Outcome: Completed/Met Date Met:  02/09/16  RD consulted for nutrition education regarding weight loss/DM.  Per pt he eats healthy. Son in room and reports that pt only eats red meat and eats a lot of desserts. Pt reports that about 2 years ago he was eating healthy, no red meat, no pasta, no bread, no sweets and lost 40 lb. Then a woman began living with him and he started eating unhealthy. Son eats healthy and wants dad to make changes, he asked many questions. Pt walked out of room during education and son had to go get him. Pt is preoccupied with leaving the hospital. [facility]ouraged follow [facility] with [facility] if more education desired.  Pt states that he can go back to eating the way he was previously when he lost weight.   RD provided "Carbohydrate Counting for People with Diabetes" handout from the Academy of [employer]etics. Discussed different food groups and their effects on blood sugar, emphasizing carbohydrate-containing foods. Provided list of carbohydrates and recommended serving sizes of common foods.  Discussed importance of controlled and consistent carbohydrate intake throughout the day. Provided examples of ways to balance meals/snacks and encouraged intake of high-fiber, whole grain complex carbohydrates. Teach back method used.  Expect poor compliance.  Current diet order is CHO Modified, patient is consuming approximately 100% of meals at this time. Labs and medications reviewed. No further nutrition interventions warranted at this time. RD contact information provided. If additional nutrition issues arise, please re-consult RD.  [place], [place],  [place] Pager [phone] After Hours Pager

## 2016-02-09 NOTE — Consult Note (Addendum)
Admission H&P    Chief Complaint: Headache and left facial droop and numbness.  HPI: Jonathan Miller is an 60 y.o. male history of hypertension and diabetes mellitus who presented to the ED with complaint of sensory changes with numbness and tingling involving left side of his face as well as a short droop noted by family members. Onset of sensory changes was 2 days prior to presentation. Facial droop was on the day prior to coming to the ED. He is also been complaining of headache involving left side of his head and involving his left ear. No speech change has been noted. He has not experienced sensorineural motor changes involving the extremities. CT scan of his head showed no acute findings. MRI showed a mildly hyperintense 7 mm focus of diffusion abnormality involving left internal capsule suspicious for acute infarction.  LSN: 02/06/2016 tPA Given: No: Beyond time under for treatment consideration mRankin:  Past Medical History  Diagnosis Date  . Headache(784.0)   . Diabetes mellitus without complication ([place])     History reviewed. No pertinent past surgical history.  History reviewed. No pertinent family history. Social History:  reports that he has been smoking Cigarettes.  He has a 44 pack-year smoking history. He does not have any smokeless tobacco history on file. He reports that he drinks alcohol. His drug history is not on file.  Allergies: No Known Allergies  Medications: Preadmission medications were reviewed by me.  ROS: History obtained from the patient  General ROS: negative for - chills, fatigue, fever, night sweats, weight gain or weight loss Psychological ROS: negative for - behavioral disorder, hallucinations, memory difficulties, mood swings or suicidal ideation Ophthalmic ROS: negative for - blurry vision, double vision, eye pain or loss of vision ENT ROS: negative for - epistaxis, nasal discharge, oral lesions, sore throat, tinnitus or vertigo Allergy and  Immunology ROS: negative for - hives or itchy/watery eyes Hematological and Lymphatic ROS: negative for - bleeding problems, bruising or swollen lymph nodes Endocrine ROS: negative for - galactorrhea, hair pattern changes, polydipsia/polyuria or temperature intolerance Respiratory ROS: negative for - cough, hemoptysis, shortness of breath or wheezing Cardiovascular ROS: negative for - chest pain, dyspnea on exertion, edema or irregular heartbeat Gastrointestinal ROS: negative for - abdominal pain, diarrhea, hematemesis, nausea/vomiting or stool incontinence Genito-Urinary ROS: negative for - dysuria, hematuria, incontinence or urinary frequency/urgency Musculoskeletal ROS: negative for - joint swelling or muscular weakness Neurological ROS: as noted in HPI Dermatological ROS: negative for rash and skin lesion changes  Physical Examination: Blood pressure 174/99, pulse 92, temperature 99.3 F (37.4 C), temperature source Oral, resp. rate 17, SpO2 92 %.  HEENT-  Normocephalic, no lesions, without obvious abnormality.  Normal external eye and conjunctiva.  Normal TM's bilaterally.  Normal auditory canals and external ears. Normal external nose, mucus membranes and septum.  Normal pharynx. Neck supple with no masses, nodes, nodules or enlargement. Cardiovascular - regular rate and rhythm, S1, S2 normal, no murmur, click, rub or gallop Lungs - chest clear, no wheezing, rales, normal symmetric air entry Abdomen - soft, non-tender; bowel sounds normal; no masses,  no organomegaly Extremities - no joint deformities, effusion, or inflammation and no edema  Neurologic Examination: Mental Status: Alert, oriented, thought content appropriate.  Speech fluent without evidence of aphasia. Able to follow commands without difficulty. Cranial Nerves: II-Visual fields were normal. III/IV/VI-Pupils were equal and reacted normally to light. Extraocular movements were full and conjugate.    V/VII-no facial  numbness; moderate left upper and lower  facial weakness, including frontalis, orbicularis oculi, nasalis and orbicularis oris muscles. VIII-normal. X-normal speech and symmetrical palatal movement. XI: trapezius strength/neck flexion strength normal bilaterally XII-midline tongue extension with normal strength. Motor: 5/5 bilaterally with normal tone and bulk Sensory: Normal throughout. Deep Tendon Reflexes: 2+ and symmetric. Plantars: Mute bilaterally Cerebellar: Normal finger-to-nose testing. Carotid auscultation: Normal  Results for orders placed or performed during the hospital encounter of 02/08/16 (from the past 48 hour(s))  CBG monitoring, ED     Status: Abnormal   Collection Time: 02/08/16  8:11 PM  Result Value Ref Range   Glucose-Capillary 221 (H) 65 - 99 mg/dL  Urinalysis, Routine w reflex microscopic     Status: Abnormal   Collection Time: 02/08/16  8:24 PM  Result Value Ref Range   Color, Urine YELLOW YELLOW   APPearance CLEAR CLEAR   Specific Gravity, Urine 1.023 1.005 - 1.030   pH 5.5 5.0 - 8.0   Glucose, UA >1000 (A) NEGATIVE mg/dL   Hgb urine dipstick NEGATIVE NEGATIVE   Bilirubin Urine NEGATIVE NEGATIVE   Ketones, ur NEGATIVE NEGATIVE mg/dL   Protein, ur 100 (A) NEGATIVE mg/dL   Nitrite NEGATIVE NEGATIVE   Leukocytes, UA NEGATIVE NEGATIVE  Urine microscopic-add on     Status: Abnormal   Collection Time: 02/08/16  8:24 PM  Result Value Ref Range   Squamous Epithelial / LPF 0-5 (A) NONE SEEN   WBC, UA 0-5 0 - 5 WBC/hpf   RBC / HPF NONE SEEN 0 - 5 RBC/hpf   Bacteria, UA NONE SEEN NONE SEEN  Protime-INR     Status: None   Collection Time: 02/08/16  8:29 PM  Result Value Ref Range   Prothrombin Time 13.2 11.6 - 15.2 seconds   INR 0.98 0.00 - 1.49  APTT     Status: None   Collection Time: 02/08/16  8:29 PM  Result Value Ref Range   aPTT 28 24 - 37 seconds  CBC     Status: None   Collection Time: 02/08/16  8:29 PM  Result Value Ref Range   WBC 8.5 4.0 -  10.5 K/uL   RBC 5.17 4.22 - 5.81 MIL/uL   Hemoglobin 15.7 13.0 - 17.0 g/dL   HCT 48.1 39.0 - 52.0 %   MCV 93.0 78.0 - 100.0 fL   MCH 30.4 26.0 - 34.0 pg   MCHC 32.6 30.0 - 36.0 g/dL   RDW 12.7 11.5 - 15.5 %   Platelets 282 150 - 400 K/uL  Differential     Status: None   Collection Time: 02/08/16  8:29 PM  Result Value Ref Range   Neutrophils Relative % 66 %   Neutro Abs 5.6 1.7 - 7.7 K/uL   Lymphocytes Relative 23 %   Lymphs Abs 2.0 0.7 - 4.0 K/uL   Monocytes Relative 7 %   Monocytes Absolute 0.6 0.1 - 1.0 K/uL   Eosinophils Relative 3 %   Eosinophils Absolute 0.3 0.0 - 0.7 K/uL   Basophils Relative 1 %   Basophils Absolute 0.1 0.0 - 0.1 K/uL  Comprehensive metabolic panel     Status: Abnormal   Collection Time: 02/08/16  8:29 PM  Result Value Ref Range   Sodium 134 (L) 135 - 145 mmol/L   Potassium 3.8 3.5 - 5.1 mmol/L   Chloride 101 101 - 111 mmol/L   CO2 25 22 - 32 mmol/L   Glucose, Bld 241 (H) 65 - 99 mg/dL   BUN 15 6 - 20 mg/dL  Creatinine, Ser 0.80 0.61 - 1.24 mg/dL   Calcium 9.0 8.9 - 10.3 mg/dL   Total Protein 6.4 (L) 6.5 - 8.1 g/dL   Albumin 3.3 (L) 3.5 - 5.0 g/dL   AST 20 15 - 41 U/L   ALT 21 17 - 63 U/L   Alkaline Phosphatase 64 38 - 126 U/L   Total Bilirubin 0.4 0.3 - 1.2 mg/dL   GFR calc non Af Amer >60 >60 mL/min   GFR calc Af Amer >60 >60 mL/min    Comment: (NOTE) The eGFR has been calculated using the CKD EPI equation. This calculation has not been validated in all clinical situations. eGFR's persistently <60 mL/min signify possible Chronic Kidney Disease.    Anion gap 8 5 - 15  I-stat troponin, ED     Status: None   Collection Time: 02/08/16  8:38 PM  Result Value Ref Range   Troponin i, poc 0.00 0.00 - 0.08 ng/mL   Comment 3            Comment: Due to the release kinetics of cTnI, a negative result within the first hours of the onset of symptoms does not rule out myocardial infarction with certainty. If myocardial infarction is still  suspected, repeat the test at appropriate intervals.   I-Stat Chem 8, ED     Status: Abnormal   Collection Time: 02/08/16  8:40 PM  Result Value Ref Range   Sodium 139 135 - 145 mmol/L   Potassium 4.0 3.5 - 5.1 mmol/L   Chloride 101 101 - 111 mmol/L   BUN 18 6 - 20 mg/dL   Creatinine, Ser 0.70 0.61 - 1.24 mg/dL   Glucose, Bld 234 (H) 65 - 99 mg/dL   Calcium, Ion 1.26 1.13 - 1.30 mmol/L   TCO2 25 0 - 100 mmol/L   Hemoglobin 17.3 (H) 13.0 - 17.0 g/dL   HCT 51.0 39.0 - 52.0 %  CBG monitoring, ED     Status: Abnormal   Collection Time: 02/08/16 11:57 PM  Result Value Ref Range   Glucose-Capillary 201 (H) 65 - 99 mg/dL   Ct Head Wo Contrast  02/08/2016  CLINICAL DATA:  Acute onset of right facial numbness at the lips. Initial encounter. EXAM: CT HEAD WITHOUT CONTRAST TECHNIQUE: Contiguous axial images were obtained from the base of the skull through the vertex without intravenous contrast. COMPARISON:  MRI/ MRA of the brain performed 01/22/2012, and CT of the head performed 01/21/2012 FINDINGS: There is no evidence of acute infarction, mass lesion, or intra- or extra-axial hemorrhage on CT. The posterior fossa, including the cerebellum, brainstem and fourth ventricle, is within normal limits. The third and lateral ventricles, and basal ganglia are unremarkable in appearance. The cerebral hemispheres are symmetric in appearance, with normal gray-white differentiation. No mass effect or midline shift is seen. There is no evidence of fracture; visualized osseous structures are unremarkable in appearance. The orbits are within normal limits. The paranasal sinuses and mastoid air cells are well-aerated. No significant soft tissue abnormalities are seen. IMPRESSION: Unremarkable noncontrast CT of the head. Electronically Signed   By: Garald Balding M.D.   On: 02/08/2016 21:15   Mr Brain Wo Contrast  02/09/2016  CLINICAL DATA:  Initial evaluation for right-sided facial numbness for 2 days. EXAM: MRI  HEAD WITHOUT CONTRAST TECHNIQUE: Multiplanar, multiecho pulse sequences of the brain and surrounding structures were obtained without intravenous contrast. COMPARISON:  Prior CT from 02/08/2016. FINDINGS: Study is moderately degraded by motion artifact. Age appropriate cerebral  atrophy present. Mild scattered T2/FLAIR hyperintensity within the periventricular and deep white matter both cerebral hemispheres present, most likely related to mild chronic small vessel ischemic disease. Small remote right cerebellar infarct present. There is a tiny 7 mm focus of high signal intensity on axial DWI sequence in the region of the left internal capsule (series 8, image 28), suspicious for a possible small acute ischemic infarct. This is faintly seen on corresponding coronal DWI sequence. No associated hemorrhage or mass effect. No other acute infarct identified. Gray-white matter differentiation maintained. Major intracranial vascular flow voids preserved. No acute or chronic intracranial hemorrhage. No mass lesion, midline shift, or mass effect. No hydrocephalus. No extra-axial fluid collection. Major dural sinuses are grossly patent. Craniocervical junction within normal limits. Visualized upper cervical spine unremarkable. Pituitary gland within normal limits. No acute abnormality about the orbits. Mild scattered mucosal thickening within the ethmoidal air cells. Paranasal sinuses are otherwise clear. Trace opacity within the right mastoid air cells. Left mastoid air cells are clear. Inner ear structures grossly normal. Bone marrow signal intensity within normal limits. No scalp soft tissue abnormality. IMPRESSION: 1. Mildly hyperintense 7 mm focus of diffusion abnormality within the left internal capsule, suspicious for a small acute/early subacute ischemic infarct. No associated hemorrhage or mass effect. 2. No other acute intracranial process identified. 3. Small remote right cerebellar infarct. Electronically Signed    By: Jeannine Boga M.D.   On: 02/09/2016 05:08    Assessment: 60 y.o. 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  Stroke Risk Factors - diabetes mellitus and hypertension  Plan: 1. HgbA1c, fasting lipid panel 2. MRA  of the brain without contrast 3. PT consult 4. Echocardiogram 5. Carotid dopplers 6. Prophylactic therapy-Antiplatelet med: Aspirin  7. Consider short course of prednisone for acute management of Bell's palsy 8. Telemetry monitoring  C.R. Nicole Kindred, MD [facility] [phone]  02/09/2016, 6:48 AM

## 2016-02-10 DIAGNOSIS — E669 Obesity, unspecified: Secondary | ICD-10-CM

## 2016-02-10 DIAGNOSIS — R739 Hyperglycemia, unspecified: Secondary | ICD-10-CM

## 2016-02-10 DIAGNOSIS — E1159 Type 2 diabetes mellitus with other circulatory complications: Secondary | ICD-10-CM

## 2016-02-10 DIAGNOSIS — Z9114 Patient's other noncompliance with medication regimen: Secondary | ICD-10-CM

## 2016-02-10 DIAGNOSIS — G510 Bell's palsy: Secondary | ICD-10-CM

## 2016-02-10 LAB — GLUCOSE, CAPILLARY
GLUCOSE-CAPILLARY: 183 mg/dL — AB (ref 65–99)
Glucose-Capillary: 240 mg/dL — ABNORMAL HIGH (ref 65–99)

## 2016-02-10 LAB — HEMOGLOBIN A1C
Hgb A1c MFr Bld: 11.8 % — ABNORMAL HIGH (ref 4.8–5.6)
MEAN PLASMA GLUCOSE: 292 mg/dL

## 2016-02-10 MED ORDER — SENNOSIDES-DOCUSATE SODIUM 8.6-50 MG PO TABS: 1.0000 | ORAL_TABLET | Freq: Every evening | ORAL | Status: DC | PRN

## 2016-02-10 MED ORDER — ATORVASTATIN CALCIUM 40 MG PO TABS: 40.0000 mg | ORAL_TABLET | Freq: Every day | ORAL | Status: DC

## 2016-02-10 MED ORDER — VALACYCLOVIR HCL 1 G PO TABS: 1000.0000 mg | ORAL_TABLET | Freq: Three times a day (TID) | ORAL | Status: DC

## 2016-02-10 MED ORDER — ARTIFICIAL TEARS OP OINT: TOPICAL_OINTMENT | Freq: Every evening | OPHTHALMIC | Status: DC | PRN

## 2016-02-10 MED ORDER — METFORMIN HCL 500 MG PO TABS: 500.0000 mg | ORAL_TABLET | Freq: Two times a day (BID) | ORAL | Status: DC

## 2016-02-10 MED ORDER — LISINOPRIL 10 MG PO TABS: 10.0000 mg | ORAL_TABLET | Freq: Every day | ORAL | Status: DC

## 2016-02-10 MED ORDER — NICOTINE 21 MG/24HR TD PT24: 21.0000 mg | MEDICATED_PATCH | Freq: Every day | TRANSDERMAL | Status: DC

## 2016-02-10 MED ORDER — PREDNISONE 20 MG PO TABS: 60.0000 mg | ORAL_TABLET | Freq: Every day | ORAL | Status: DC

## 2016-02-10 MED ORDER — HYPROMELLOSE (GONIOSCOPIC) 2.5 % OP SOLN: 2.0000 [drp] | OPHTHALMIC | Status: DC | PRN

## 2016-02-10 MED ORDER — ASPIRIN 325 MG PO TABS: 325.0000 mg | ORAL_TABLET | Freq: Every day | ORAL | Status: DC

## 2016-02-10 NOTE — Evaluation (Signed)
Physical Therapy Evaluation Patient Details Name: Jonathan Miller MRN: [id number] DOB: Feb 17, 1956 Today's Date: 02/10/2016   History of Present Illness  Patient is a 60 y/o male with hx of HTN, DM, Bells palsy, obesity and tobacco abuse presents with sensory changes with numbness and tingling involving left side of his face. MRI- mildly hyperintense 7 mm focus of diffusion abnormality involving left internal capsule suspicious for acute infarction. Symptoms consistent with Bells Palsy.   Clinical Impression  Patient reports numbness of face and watery left eye but no other symptoms. Pt impulsive with movement which is most likely baseline. Tolerated gait training while performing higher level balance challenges and stair training without difficulty. Reports feeling at baseline with regards to functional mobility. Eager to return home. Does not require skilled therapy services. Discharge from therapy.    Follow Up Recommendations No PT follow up    Equipment Recommendations  None recommended by PT    Recommendations for Other Services       Precautions / Restrictions Precautions Precautions: None Restrictions Weight Bearing Restrictions: No      Mobility  Bed Mobility               General bed mobility comments: Sitting in chair upon PT arrival.   Transfers Overall transfer level: Independent               General transfer comment: Stood from chair impulsively, "I will do what I have to do to get out of here."  Ambulation/Gait Ambulation/Gait assistance: Independent Ambulation Distance (Feet): 400 Feet Assistive device: None Gait Pattern/deviations: WFL(Within Functional Limits)   Gait velocity interpretation: at or above normal speed for age/gender General Gait Details: Tolerated higher level balance challenges without LOB or difficulty.   Stairs Stairs: Yes Stairs assistance: Independent Stair Management: Alternating pattern;No rails Number of Stairs:  13 General stair comments: Mild DOE with stair negotiation. No difficulties.   Wheelchair Mobility    Modified Rankin (Stroke Patients Only) Modified Rankin (Stroke Patients Only) Pre-Morbid Rankin Score: No symptoms Modified Rankin: No significant disability     Balance Overall balance assessment: Needs assistance   Sitting balance-Leahy Scale: Normal       Standing balance-Leahy Scale: Normal               High level balance activites: Direction changes;Turns;Sudden stops;Head turns High Level Balance Comments: Tolerated above without deviations in gait or difficulty. No LOB.  Standardized Balance Assessment Standardized Balance Assessment : Dynamic Gait Index   Dynamic Gait Index Level Surface: Normal Change in Gait Speed: Normal Gait with Horizontal Head Turns: Normal Gait with Vertical Head Turns: Normal Gait and Pivot Turn: Normal Step Over Obstacle: Mild Impairment Step Around Obstacles: Normal Steps: Normal Total Score: 23       Pertinent Vitals/Pain Pain Assessment: No/denies pain    Home Living Family/patient expects to be discharged to:: Private residence Living Arrangements: Children Available Help at Discharge: Family Type of Home: House       Home Layout: One level Home Equipment: None      Prior Function Level of Independence: Independent               Hand Dominance        Extremity/Trunk Assessment   Upper Extremity Assessment: Defer to OT evaluation           Lower Extremity Assessment: Overall WFL for tasks assessed      Cervical / Trunk Assessment:  (Reports watery left eye.)  Communication  Communication: No difficulties  Cognition Arousal/Alertness: Awake/alert Behavior During Therapy: WFL for tasks assessed/performed Overall Cognitive Status: Within Functional Limits for tasks assessed                      General Comments      Exercises        Assessment/Plan    PT Assessment  Patent does not need any further PT services  PT Diagnosis Difficulty walking   PT Problem List    PT Treatment Interventions     PT Goals (Current goals can be found in the Care Plan section) Acute Rehab PT Goals Patient Stated Goal: to go home PT Goal Formulation: All assessment and education complete, DC therapy Time For Goal Achievement: 02/24/16 Potential to Achieve Goals: Good    Frequency     Barriers to discharge        Co-evaluation               End of Session Equipment Utilized During Treatment: Gait belt Activity Tolerance: Patient tolerated treatment well Patient left: in chair;with call bell/phone within reach;with family/visitor present Nurse Communication: Mobility status         Time: [id number] PT Time Calculation (min) (ACUTE ONLY): 10 min   Charges:   PT Evaluation $PT Eval Low Complexity: 1 Procedure     PT G Codes:        Elisa Sorlie A Johanne Mcglade 02/10/2016, 11:11 AM Mylo RedShauna Chaunce Winkels, PT, DPT [phone]

## 2016-02-10 NOTE — Progress Notes (Signed)
Inpatient Diabetes Program Recommendations  AACE/ADA: New Consensus Statement on Inpatient Glycemic Control (2015)  Target Ranges:  Prepandial:   less than 140 mg/dL      Peak postprandial:   less than 180 mg/dL (1-2 hours)      Critically ill patients:  140 - 180 mg/dL   Spoke with patient about diabetes and home regimen for diabetes control. Patient reports that [facility]. Patient reports loosing weight and really controlling his diet at one time. Patient reports not wanting to take his meds and checking his glucose recently, "just got tired". Patient seems motivated at this time to control his glucose levels.  Inquired about prior A1C and patient reports that he does not recall his last A1C value. Discussed A1C results (11.8% on 02/09/2016) and explained that his current A1C indicates an average glucose in the 290's mg/dl over the past 2-3 months. Discussed glucose and A1C goals. Discussed importance of checking CBGs and maintaining good CBG control to prevent long-term and short-term complications.  Discussed impact of nutrition, exercise, stress, sickness, and medications on diabetes control. Discussed Metformin and possible side effects and sick day management with it. Discussed with him about speaking to his MD about another oral medication and taking it consistently to reduce his A1c.   Patient's son reports patient likes to eat sweets. Discussed carbohydrates, carbohydrate goals per day and meal, along with portion sizes. Patient reports loosing weight before and knowing what to do. Patient reports drinking water mostly. Patient verbalized understanding of information discussed and he states that he has no further questions at this time related to diabetes. Patient very motivated at the end of discussion.  Thanks, Christena DeemShannon Sejal Cofield RN, MSN, [facility] Inpatient Diabetes Coordinator Team Pager [phone] (8a-5p)

## 2016-02-10 NOTE — Discharge Summary (Signed)
Physician Discharge Summary  Gwendolyn Nishi Karis ZOX:[id number] DOB: 1956/07/23 DOA: 02/08/2016  PCP: Henri Medal, MD  Admit date: 02/08/2016 Discharge date: 02/10/2016  Time spent: 45 minutes  Recommendations for Outpatient Follow-up:  Patient will be discharged to home.  Patient will need to follow up with primary care provider, [facility] on 02/11/16 at 9:30am.  Follow up with Dr. Roda Shutters, neurologist in 2 months. Patient should continue medications as prescribed.  Patient should follow a heart healthy/carb modified diet.   Discharge Diagnoses:  Rales positive, suspect CVA Essential hypertension Diabetes mellitus, type II Hyperlipidemia Tobacco abuse Nonadherent Headache Obesity  Discharge Condition: Stable  Diet recommendation: heart healthy/carb modified  Filed Weights   02/10/16 0556  Weight: 98.431 kg (217 lb)    History of present illness:  On 02/09/2016 by Ms. Toya Smothers, NP Ellin Mayhew Lanz is a somewhat contrary 60 y.o. male medical history that includes diabetes hypertension obesity tobacco use and noncompliance with all the above presents to the emergency department with the chief complaint of headache and right-sided facial numbness for 2 days. Initial evaluation includes an MRI brain revealing small acute/subacute infarct.  Information is obtained from the patient and the son who is at the bedside. Patient reports intermittent headaches and sinus congestion for the previous 2-3 days. He came to the emergency department when he developed right-sided facial numbness and right eye irritation. He denies visual disturbances numbness or tingling of extremities. He denies difficulty chewing or swallowing unsteady gait. He denies chest pain palpitation shortness of breath diaphoresis. He denies abdominal pain nausea vomiting. He denies dysuria hematuria frequency or urgency. He denies lower extremity edema or orthopnea.  In the emergency department is afebrile  hypertensive and not hypoxic. He is provided with 1 dose of hydralazine.  Hospital Course:  Bell's Palsy, suspect CVA -Neurology consulted and appreciated -MRI showed left internal capsule DWI signal change, MRA showed no acute findings -Patient did have headache upon admission however this has resolved -Carotid Doppler: 1-39% stenosis bilateral ICA, vertebral arteries patent with antegrade flow -Echocardiogram EF 6065%, grade 1 diastolic dysfunction -LDL 136 -Hemoglobin A1c 11.8 -Continue aspirin and statin -Physical therapy consulted, patient does not need further recommendations or therapy -Patient started on valacyclovir 1 g 3 times a day for 7 days, prednisone 60 mg daily for 7 days -Follow-up with neurology within 2 months  Essential hypertension -Lisinopril held to allow for permissive hypertension given possible stroke -Hold for one week.   Diabetes mellitus, type II -Hemoglobin A1c 11.8 -Patient will be discharged with metformin, he will need close follow-up with PCP -Suspect sugars will be uncontrolled while on prednisone -Spoke with patient regarding insulin.  Patient stated he stopped taking his medications because he "got tired of taking medicines."   -Given that patient will was not compliant, will restart metformin.  He will need CLOSE follow up with PCP. May need additional oral agents.   Hyperlipidemia  -LDL 136, goal less than 70 -Will discharge with statin  Tobacco abuse -Smoking cessation discussed -Continue nicotine patch  Non-adherent -Patient has had noncompliance with medications as well as outpatient follow-ups -Patient counseled  on the need to take his medications appropriately as well as follow-up. -Patient does have appointment with [facility] on 02/11/2016 at 9:30 AM.  Headache -Resolved  Obesity -Nutrition consulted -Patient will follow-up closely with his primary care physician to discuss lifestyle modifications including diet  and exercise.  Procedures: Echocardiogram Carotid doppler  Consultations: Neurology   Discharge  Exam: Filed Vitals:   02/10/16 0223 02/10/16 0729  BP: 129/72 131/71  Pulse: 96 87  Temp: 98 F (36.7 C) 97.9 F (36.6 C)  Resp: 16 16     General: Well developed, well nourished, NAD, appears stated age  HEENT: NCAT,  mucous membranes moist.  Cardiovascular: S1 S2 auscultated, no murmurs, RRR  Respiratory: Diminished but clear, slight exp wheeze  Abdomen: Soft, nontender, nondistended, + bowel sounds  Extremities: warm dry without cyanosis clubbing or edema  Neuro: AAOx3, slight right facial droop, otherwise, nonfocal  Psych: Normal affect and demeanor   Discharge Instructions      Discharge Instructions    Discharge instructions    Complete by:  As directed   Patient will be discharged to home.  Patient will need to follow up with primary care provider, [facility] on 02/11/16 at 9:30am.  Follow up with Dr. Roda Shutters, neurologist in 2 months. Patient should continue medications as prescribed.  Patient should follow a heart healthy/carb modified diet.            Medication List    TAKE these medications        artificial tears Oint ophthalmic ointment  Place into the left eye at bedtime as needed for dry eyes.     aspirin 325 MG tablet  Take 1 tablet (325 mg total) by mouth daily.     atorvastatin 40 MG tablet  Commonly known as:  LIPITOR  Take 1 tablet (40 mg total) by mouth daily at 6 PM.     hydroxypropyl methylcellulose / hypromellose 2.5 % ophthalmic solution  Commonly known as:  ISOPTO TEARS / GONIOVISC  Place 2 drops into the left eye as needed for dry eyes.     lisinopril 10 MG tablet  Commonly known as:  PRINIVIL  Take 1 tablet (10 mg total) by mouth daily. Start on 02/14/16     metFORMIN 500 MG tablet  Commonly known as:  GLUCOPHAGE  Take 1 tablet (500 mg total) by mouth 2 (two) times daily with a meal.     nicotine 21 mg/24hr patch    Commonly known as:  NICODERM CQ - dosed in mg/24 hours  Place 1 patch (21 mg total) onto the skin daily.     predniSONE 20 MG tablet  Commonly known as:  DELTASONE  Take 3 tablets (60 mg total) by mouth daily with breakfast.     senna-docusate 8.6-50 MG tablet  Commonly known as:  Senokot-S  Take 1 tablet by mouth at bedtime as needed for moderate constipation.     valACYclovir 1000 MG tablet  Commonly known as:  VALTREX  Take 1 tablet (1,000 mg total) by mouth 3 (three) times daily.       No Known Allergies Follow-up Information    Follow up with [facility] On 02/10/2016.   Why:  Post hospital follow up scheduled 02/11/16 at 9:30 am   Contact information:   [address] [place] [address] [phone]      Follow up with Xu,Jindong, MD In 2 months.   Specialty:  Neurology   Why:  stroke clinic   Contact information:   [address] [place] [address] [phone]        The results of significant diagnostics from this hospitalization (including imaging, microbiology, ancillary and laboratory) are listed below for reference.    Significant Diagnostic Studies: Dg Chest 2 View  02/09/2016  CLINICAL  DATA:  Shortness of breath, history of previous CVA, current smoker. EXAM: CHEST  2 VIEW COMPARISON:  PA and lateral chest x-ray of Feb 04, 2013 FINDINGS: The lungs are hyperinflated with hemidiaphragm flattening. The interstitial markings are coarse though stable. There is no alveolar infiltrate or pleural effusion. The heart is normal in size. The pulmonary vascularity is not engorged. The mediastinum is normal in width. There is severe degenerative change of both shoulders. IMPRESSION: COPD. There is no evidence of pneumonia, CHF, nor other acute cardiopulmonary abnormality. Electronically Signed   By: David  [place] M.D.   On: 02/09/2016 08:03   Ct Head Wo Contrast  02/08/2016  CLINICAL DATA:  Acute onset  of right facial numbness at the lips. Initial encounter. EXAM: CT HEAD WITHOUT CONTRAST TECHNIQUE: Contiguous axial images were obtained from the base of the skull through the vertex without intravenous contrast. COMPARISON:  MRI/ MRA of the brain performed 01/22/2012, and CT of the head performed 01/21/2012 FINDINGS: There is no evidence of acute infarction, mass lesion, or intra- or extra-axial hemorrhage on CT. The posterior fossa, including the cerebellum, brainstem and fourth ventricle, is within normal limits. The third and lateral ventricles, and basal ganglia are unremarkable in appearance. The cerebral hemispheres are symmetric in appearance, with normal gray-white differentiation. No mass effect or midline shift is seen. There is no evidence of fracture; visualized osseous structures are unremarkable in appearance. The orbits are within normal limits. The paranasal sinuses and mastoid air cells are well-aerated. No significant soft tissue abnormalities are seen. IMPRESSION: Unremarkable noncontrast CT of the head. Electronically Signed   By: Roanna Raider M.D.   On: 02/08/2016 21:15   Mr Brain Wo Contrast  02/09/2016  CLINICAL DATA:  Initial evaluation for right-sided facial numbness for 2 days. EXAM: MRI HEAD WITHOUT CONTRAST TECHNIQUE: Multiplanar, multiecho pulse sequences of the brain and surrounding structures were obtained without intravenous contrast. COMPARISON:  Prior CT from 02/08/2016. FINDINGS: Study is moderately degraded by motion artifact. Age appropriate cerebral atrophy present. Mild scattered T2/FLAIR hyperintensity within the periventricular and deep white matter both cerebral hemispheres present, most likely related to mild chronic small vessel ischemic disease. Small remote right cerebellar infarct present. There is a tiny 7 mm focus of high signal intensity on axial DWI sequence in the region of the left internal capsule (series 8, image 28), suspicious for a possible small acute  ischemic infarct. This is faintly seen on corresponding coronal DWI sequence. No associated hemorrhage or mass effect. No other acute infarct identified. Gray-white matter differentiation maintained. Major intracranial vascular flow voids preserved. No acute or chronic intracranial hemorrhage. No mass lesion, midline shift, or mass effect. No hydrocephalus. No extra-axial fluid collection. Major dural sinuses are grossly patent. Craniocervical junction within normal limits. Visualized upper cervical spine unremarkable. Pituitary gland within normal limits. No acute abnormality about the orbits. Mild scattered mucosal thickening within the ethmoidal air cells. Paranasal sinuses are otherwise clear. Trace opacity within the right mastoid air cells. Left mastoid air cells are clear. Inner ear structures grossly normal. Bone marrow signal intensity within normal limits. No scalp soft tissue abnormality. IMPRESSION: 1. Mildly hyperintense 7 mm focus of diffusion abnormality within the left internal capsule, suspicious for a small acute/early subacute ischemic infarct. No associated hemorrhage or mass effect. 2. No other acute intracranial process identified. 3. Small remote right cerebellar infarct. Electronically Signed   By: Rise Mu M.D.   On: 02/09/2016 05:08   Mr Maxine Glenn Head/brain Wo Cm  02/09/2016  CLINICAL DATA:  Stroke. EXAM: MRA HEAD WITHOUT CONTRAST TECHNIQUE: Angiographic images of the Circle of Willis were obtained using MRA technique without intravenous contrast. COMPARISON:  01/22/2012 FINDINGS: Motion which limits assessment. Essentially balanced vertebral and carotid arteries. Tortuous vertebrobasilar system. Hypoplastic left A1 segment with large fetal type left posterior communicating artery. Apparent moderate narrowing at the anterior genu left cavernous carotid, newly seen, appears artifactual based on source images where the vessel diameter is symmetric in size but has weaker signal.  Temporal branch of the left MCA has poor signal, but likely related to motion based on source images. No convincing major branch occlusion or aneurysm. IMPRESSION: 1. No acute arterial finding. 2. Apparent moderate cavernous left ICA narrowing is likely artifactual. No major branch occlusion. 3. Motion degraded study. Electronically Signed   By: Marnee SpringJonathon  Watts M.D.   On: 02/09/2016 09:13    Microbiology: No results found for this or any previous visit (from the past 240 hour(s)).   Labs: Basic Metabolic Panel:  Recent Labs Lab 02/08/16 2029 02/08/16 2040  NA 134* 139  K 3.8 4.0  CL 101 101  CO2 25  --   GLUCOSE 241* 234*  BUN 15 18  CREATININE 0.80 0.70  CALCIUM 9.0  --    Liver Function Tests:  Recent Labs Lab 02/08/16 2029  AST 20  ALT 21  ALKPHOS 64  BILITOT 0.4  PROT 6.4*  ALBUMIN 3.3*   No results for input(s): LIPASE, AMYLASE in the last 168 hours. No results for input(s): AMMONIA in the last 168 hours. CBC:  Recent Labs Lab 02/08/16 2029 02/08/16 2040  WBC 8.5  --   NEUTROABS 5.6  --   HGB 15.7 17.3*  HCT 48.1 51.0  MCV 93.0  --   PLT 282  --    Cardiac Enzymes: No results for input(s): CKTOTAL, CKMB, CKMBINDEX, TROPONINI in the last 168 hours. BNP: BNP (last 3 results) No results for input(s): BNP in the last 8760 hours.  ProBNP (last 3 results) No results for input(s): PROBNP in the last 8760 hours.  CBG:  Recent Labs Lab 02/09/16 1042 02/09/16 1234 02/09/16 1642 02/09/16 2104 02/10/16 0802  GLUCAP 310* 259* 269* 372* 183*       Signed:  Edsel PetrinMIKHAIL, Arlin Sass  [facility] 02/10/2016, 11:38 AM

## 2016-02-10 NOTE — Discharge Instructions (Signed)
Bell Palsy °Bell palsy is a condition in which the muscles on one side of the face become paralyzed. This often causes one side of the face to droop. It is a common condition and most people recover completely. °RISK FACTORS °Risk factors for Bell palsy include: °· Pregnancy. °· Diabetes. °· An infection by a virus, such as infections that cause cold sores. °CAUSES  °Bell palsy is caused by damage to or inflammation of a nerve in your face. It is unclear why this happens, but an infection by a virus may lead to it. Most of the time the reason it happens is unknown. °SIGNS AND SYMPTOMS  °Symptoms can range from mild to severe and can take place over a number of hours. Symptoms may include: °· Being unable to: °¨ Raise one or both eyebrows. °¨ Close one or both eyes. °¨ Feel parts of your face (facial numbness). °· Drooping of the eyelid and corner of the mouth. °· Weakness in the face. °· Paralysis of half your face. °· Loss of taste. °· Sensitivity to loud noises. °· Difficulty chewing. °· Tearing up of the affected eye. °· Dryness in the affected eye. °· Drooling. °· Pain behind one ear. °DIAGNOSIS  °Diagnosis of Bell palsy may include: °· A medical history and physical exam. °· An MRI. °· A CT scan. °· Electromyography (EMG). This is a test that checks how your nerves are working. °TREATMENT  °Treatment may include antiviral medicine to help shorten the length of the condition. Sometimes treatment is not needed and the symptoms go away on their own. °HOME CARE INSTRUCTIONS  °· Take medicines only as directed by your health care provider. °· Do facial massages and exercises as directed by your health care provider. °· If your eye is affected: °¨ Use moisturizing eye drops to prevent drying of your eye as directed by your health care provider. °¨ Protect your eye as directed by your health care provider. °SEEK MEDICAL CARE IF: °· Your symptoms do not get better or get worse. °· You are drooling. °· Your eye is red,  irritated, or hurts. °SEEK IMMEDIATE MEDICAL CARE IF:  °· Another part of your body feels weak or numb. °· You have difficulty swallowing. °· You have a fever along with symptoms of Bell palsy. °· You develop neck pain. °MAKE SURE YOU:  °· Understand these instructions. °· Will watch your condition. °· Will get help right away if you are not doing well or get worse. °  °This information is not intended to replace advice given to you by your health care provider. Make sure you discuss any questions you have with your health care provider. °  °Document Released: 09/06/2005 Document Revised: 05/28/2015 Document Reviewed: 12/14/2013 °Elsevier Interactive Patient Education ©2016 Elsevier Inc. ° °

## 2016-02-10 NOTE — Progress Notes (Signed)
UR COMPLETED  

## 2016-02-10 NOTE — Care Management Note (Addendum)
Case Management Note  Patient Details  Name: Jonathan OsgoodJohn W Miller MRN: [id number] Date of Birth: 04/26/1956  Subjective/Objective:                    Action/Plan: Plan is to d/c to home today with help from son Brent BullaWalter Grace[facility](Son) [phone], if needed.   Expected Discharge Date:   02/10/2016           Expected Discharge Plan:  [facility] Income/Self Care  Discharge planning Services  CM Consult  Gae GallopCole, Traves Majchrzak [place], [place] 02/10/2016, 9:48 AM

## 2016-02-10 NOTE — Progress Notes (Signed)
Nsg Discharge Note  Admit Date:  02/08/2016 Discharge date: 02/10/2016   Jonathan MayhewJohn W Miller to be D/C'd home  per MD order.  AVS completed.  Copy for chart, and copy for patient signed, and dated. Patient able to verbalize understanding.  Discharge Medication:   Medication List    TAKE these medications        artificial tears Oint ophthalmic ointment  Place into the left eye at bedtime as needed for dry eyes.     aspirin 325 MG tablet  Take 1 tablet (325 mg total) by mouth daily.     atorvastatin 40 MG tablet  Commonly known as:  LIPITOR  Take 1 tablet (40 mg total) by mouth daily at 6 PM.     hydroxypropyl methylcellulose / hypromellose 2.5 % ophthalmic solution  Commonly known as:  ISOPTO TEARS / GONIOVISC  Place 2 drops into the left eye as needed for dry eyes.     lisinopril 10 MG tablet  Commonly known as:  PRINIVIL  Take 1 tablet (10 mg total) by mouth daily. Start on 02/14/16     metFORMIN 500 MG tablet  Commonly known as:  GLUCOPHAGE  Take 1 tablet (500 mg total) by mouth 2 (two) times daily with a meal.     nicotine 21 mg/24hr patch  Commonly known as:  NICODERM CQ - dosed in mg/24 hours  Place 1 patch (21 mg total) onto the skin daily.     predniSONE 20 MG tablet  Commonly known as:  DELTASONE  Take 3 tablets (60 mg total) by mouth daily with breakfast.     senna-docusate 8.6-50 MG tablet  Commonly known as:  Senokot-S  Take 1 tablet by mouth at bedtime as needed for moderate constipation.     valACYclovir 1000 MG tablet  Commonly known as:  VALTREX  Take 1 tablet (1,000 mg total) by mouth 3 (three) times daily.        Discharge Assessment: Filed Vitals:   02/10/16 0223 02/10/16 0729  BP: 129/72 131/71  Pulse: 96 87  Temp: 98 F (36.7 C) 97.9 F (36.6 C)  Resp: 16 16    D/c Instructions-Education: Discharge instructions given to patient with verbalized understanding. D/c education completed with patient including follow up instructions, medication  list, d/c activities limitations if indicated, with other d/c instructions as indicated by MD - patient able to verbalize understanding, all questions fully answered. Patient instructed to return to ED, call 911, or call MD for any changes in condition.  Patient ambulated off unit with son for discharge.   Tobin Chadracy Raine Blodgett, RN 02/10/2016 2:09 PM

## 2016-02-11 ENCOUNTER — Encounter: Payer: Self-pay | Admitting: Internal Medicine

## 2016-02-11 ENCOUNTER — Ambulatory Visit: Payer: Self-pay | Attending: Internal Medicine | Admitting: Internal Medicine

## 2016-02-11 VITALS — BP 158/96 | HR 72 | Temp 97.8°F | Resp 16 | Ht 68.0 in | Wt 218.0 lb

## 2016-02-11 DIAGNOSIS — F17210 Nicotine dependence, cigarettes, uncomplicated: Secondary | ICD-10-CM | POA: Insufficient documentation

## 2016-02-11 DIAGNOSIS — K219 Gastro-esophageal reflux disease without esophagitis: Secondary | ICD-10-CM | POA: Insufficient documentation

## 2016-02-11 DIAGNOSIS — Z7982 Long term (current) use of aspirin: Secondary | ICD-10-CM | POA: Insufficient documentation

## 2016-02-11 DIAGNOSIS — I10 Essential (primary) hypertension: Secondary | ICD-10-CM | POA: Insufficient documentation

## 2016-02-11 DIAGNOSIS — I63039 Cerebral infarction due to thrombosis of unspecified carotid artery: Secondary | ICD-10-CM

## 2016-02-11 DIAGNOSIS — K449 Diaphragmatic hernia without obstruction or gangrene: Secondary | ICD-10-CM | POA: Insufficient documentation

## 2016-02-11 DIAGNOSIS — E669 Obesity, unspecified: Secondary | ICD-10-CM | POA: Insufficient documentation

## 2016-02-11 DIAGNOSIS — Z7984 Long term (current) use of oral hypoglycemic drugs: Secondary | ICD-10-CM | POA: Insufficient documentation

## 2016-02-11 DIAGNOSIS — E118 Type 2 diabetes mellitus with unspecified complications: Secondary | ICD-10-CM | POA: Insufficient documentation

## 2016-02-11 DIAGNOSIS — Z79899 Other long term (current) drug therapy: Secondary | ICD-10-CM | POA: Insufficient documentation

## 2016-02-11 DIAGNOSIS — M1990 Unspecified osteoarthritis, unspecified site: Secondary | ICD-10-CM | POA: Insufficient documentation

## 2016-02-11 LAB — GLUCOSE, POCT (MANUAL RESULT ENTRY): POC GLUCOSE: 202 mg/dL — AB (ref 70–99)

## 2016-02-11 MED ORDER — ATORVASTATIN CALCIUM 40 MG PO TABS: 40.0000 mg | ORAL_TABLET | Freq: Every day | ORAL | Status: DC

## 2016-02-11 MED ORDER — LISINOPRIL 10 MG PO TABS: 10.0000 mg | ORAL_TABLET | Freq: Every day | ORAL | Status: DC

## 2016-02-11 MED ORDER — METFORMIN HCL 500 MG PO TABS: 500.0000 mg | ORAL_TABLET | Freq: Two times a day (BID) | ORAL | Status: DC

## 2016-02-11 MED FILL — ATORVASTATIN 40 MG TABLET: 40 | 30 days supply | Qty: 30 | Fill #0

## 2016-02-11 MED FILL — metFORMIN HCL 500 MG TABS: 500 | 30 days supply | Qty: 60 | Fill #0

## 2016-02-11 MED FILL — LISINOPRIL 10 MG TABLET: 10 | 30 days supply | Qty: 30 | Fill #0

## 2016-02-11 NOTE — Assessment & Plan Note (Signed)
This is unclear to me. I do not think he has had a stroke i suspect mRI is an artifact. However he is at high risk for vascular event and should continue asa and other risk factor modification.

## 2016-02-11 NOTE — Patient Instructions (Signed)
Check your blood sugar two times daily and record the values. Call for blood sugar greater than 350.   Stop prednisone.

## 2016-02-11 NOTE — Progress Notes (Signed)
HFU- due to elevated glucose  Last medication intake yesterday at Hospital discharge  Lt eye red, unable to closed due to bell palsy  No pain today  Tobacco user-1 ppday  No suicidal thoughts in the past two weeks    Patient discharged from hospital yesterday. There was question of stroke but more likely bell's palsy. He has DM and has started prednisone for bell's palsy.   He has not been taking meds, has not filled prescriptions.  Has known htn, hyperlipidemia, smoker, dm   Past Medical History  Diagnosis Date  . Obesity   . Tobacco abuse   . HTN (hypertension)   . Type II diabetes mellitus (HCC)   . History of hiatal hernia   . GERD (gastroesophageal reflux disease)   . Daily headache     "recently" (02/09/2016)  . Bell's palsy 01/2012; 02/09/2016  . Arthritis     "right shoulder" (02/09/2016)    Social History   Social History  . Marital Status: Divorced    Spouse Name: N/A  . Number of Children: N/A  . Years of Education: N/A   Occupational History  . Not on file.   Social History Main Topics  . Smoking status: Current Every Day Smoker -- 1.00 packs/day for 48 years    Types: Cigarettes  . Smokeless tobacco: Former User    Types: Chew     Comment: 02/09/2016 "chewed from age [age]"  . Alcohol Use: 2.4 oz/week    2 Cans of beer, 2 Shots of liquor per week  . Drug Use: No  . Sexual Activity: Not on file   Other Topics Concern  . Not on file   Social History Narrative    Past Surgical History  Procedure Laterality Date  . No past surgeries      Family History  Problem Relation Age of Onset  . Family history unknown: Yes    No Known Allergies  Current Outpatient Prescriptions on File Prior to Visit  Medication Sig Dispense Refill  . artificial tears (LACRILUBE) OINT ophthalmic ointment Place into the left eye at bedtime as needed for dry eyes. 1 Tube 0  . aspirin 325 MG tablet Take 1 tablet (325 mg total) by mouth daily. 30 tablet 0  .  atorvastatin (LIPITOR) 40 MG tablet Take 1 tablet (40 mg total) by mouth daily at 6 PM. (Patient not taking: Reported on 02/11/2016) 30 tablet 0  . hydroxypropyl methylcellulose / hypromellose (ISOPTO TEARS / GONIOVISC) 2.5 % ophthalmic solution Place 2 drops into the left eye as needed for dry eyes. (Patient not taking: Reported on 02/11/2016) 15 mL 12  . lisinopril (PRINIVIL) 10 MG tablet Take 1 tablet (10 mg total) by mouth daily. Start on 02/14/16 (Patient not taking: Reported on 02/11/2016) 30 tablet 0  . metFORMIN (GLUCOPHAGE) 500 MG tablet Take 1 tablet (500 mg total) by mouth 2 (two) times daily with a meal. (Patient not taking: Reported on 02/11/2016) 60 tablet 0  . nicotine (NICODERM CQ - DOSED IN MG/24 HOURS) 21 mg/24hr patch Place 1 patch (21 mg total) onto the skin daily. (Patient not taking: Reported on 02/11/2016) 28 patch 0  . predniSONE (DELTASONE) 20 MG tablet Take 3 tablets (60 mg total) by mouth daily with breakfast. (Patient not taking: Reported on 02/11/2016) 21 tablet 0  . senna-docusate (SENOKOT-S) 8.6-50 MG tablet Take 1 tablet by mouth at bedtime as needed for moderate constipation. (Patient not taking: Reported on 02/11/2016) 30 tablet 0  . valACYclovir (VALTREX)  1000 MG tablet Take 1 tablet (1,000 mg total) by mouth 3 (three) times daily. (Patient not taking: Reported on 02/11/2016) 21 tablet 0   No current facility-administered medications on file prior to visit.     patient denies chest pain, shortness of breath, orthopnea. Denies lower extremity edema, abdominal pain, change in appetite, change in bowel movements. Patient denies rashes, musculoskeletal complaints. No other specific complaints in a complete review of systems.   BP 158/96 mmHg  Pulse 72  Temp(Src) 97.8 F (36.6 C) (Oral)  Resp 16  Ht 5\' 8"  (1.727 m)  Wt 218 lb (98.884 kg)  BMI 33.15 kg/m2  SpO2 96% nad Chest clear cv- reg rate abd- obese, soft extr- no edema Neuro- left facial droop.. Weak eyelid on  left. Does not spontaneously blink   Cerebrovascular accident (CVA) (HCC) This is unclear to me. I do not think he has had a stroke i suspect mRI is an artifact. However he is at high risk for vascular event and should continue asa and other risk factor modification.  Essential hypertension He has not been taking meds wil start now. [facility]  Diabetes mellitus with complication [facility](HCC) He is on high dose prednisone and his blood sugars have been high Will check CBG i think the risk of uncontrolled dm far outweighs the possible benefit of steroids on bell's palsy

## 2016-02-11 NOTE — Assessment & Plan Note (Signed)
He has not been taking meds wil start now. [facility]

## 2016-02-11 NOTE — Assessment & Plan Note (Signed)
He is on high dose prednisone and his blood sugars have been high Will check CBG i think the risk of uncontrolled dm far outweighs the possible benefit of steroids on bell's palsy

## 2016-03-12 MED FILL — metFORMIN HCL 500 MG TABS: 500 | 30 days supply | Qty: 60 | Fill #1

## 2016-03-12 MED FILL — LISINOPRIL 10 MG TABLET: 10 | 30 days supply | Qty: 30 | Fill #1

## 2016-04-09 MED FILL — ?METFORMIN HCL 500MG TABLET: 500 | 30 days supply | Qty: 60 | Fill #2

## 2016-04-09 MED FILL — ?LISINOPRIL 10 MG TABLET: 10 | 30 days supply | Qty: 30 | Fill #2

## 2016-05-10 MED FILL — ?LISINOPRIL 10 MG TABLET: 10 | 30 days supply | Qty: 30 | Fill #3

## 2016-05-10 MED FILL — ?ATORVASTATIN 40MG TABLET: 40 | 30 days supply | Qty: 30 | Fill #1

## 2016-05-11 MED FILL — ?METFORMIN HCL 500MG TABLET: 500 | 30 days supply | Qty: 60 | Fill #3

## 2016-06-09 MED FILL — ?METFORMIN HCL 500MG TABLET: 500 | 30 days supply | Qty: 60 | Fill #4

## 2016-06-09 MED FILL — LISINOPRIL 10 MG TABLET: 10 | 30 days supply | Qty: 30 | Fill #4

## 2016-07-12 MED FILL — LISINOPRIL 10 MG TABLET: 10 | 30 days supply | Qty: 30 | Fill #5

## 2016-07-12 MED FILL — metFORMIN HCL 500 MG TABS: 500 | 30 days supply | Qty: 60 | Fill #5

## 2016-08-10 ENCOUNTER — Other Ambulatory Visit: Payer: Self-pay | Admitting: Internal Medicine

## 2016-08-10 MED FILL — ?METFORMIN HCL 500MG TABLET: 500 | 30 days supply | Qty: 60 | Fill #0

## 2016-08-10 MED FILL — ?LISINOPRIL 10 MG TABLET: 10 | 30 days supply | Qty: 30 | Fill #6

## 2016-08-10 NOTE — Telephone Encounter (Signed)
Please refill all medication.please

## 2016-08-23 ENCOUNTER — Ambulatory Visit: Payer: Self-pay | Admitting: Family Medicine

## 2016-09-01 ENCOUNTER — Ambulatory Visit: Payer: Self-pay | Attending: Family Medicine | Admitting: Family Medicine

## 2016-09-01 ENCOUNTER — Encounter: Payer: Self-pay | Admitting: Family Medicine

## 2016-09-01 VITALS — BP 124/72 | HR 84 | Temp 98.4°F | Ht 65.0 in | Wt 217.2 lb

## 2016-09-01 DIAGNOSIS — E6609 Other obesity due to excess calories: Secondary | ICD-10-CM

## 2016-09-01 DIAGNOSIS — Z6839 Body mass index (BMI) 39.0-39.9, adult: Secondary | ICD-10-CM

## 2016-09-01 DIAGNOSIS — E119 Type 2 diabetes mellitus without complications: Secondary | ICD-10-CM | POA: Insufficient documentation

## 2016-09-01 DIAGNOSIS — E66812 Obesity, class 2: Secondary | ICD-10-CM

## 2016-09-01 DIAGNOSIS — M1990 Unspecified osteoarthritis, unspecified site: Secondary | ICD-10-CM | POA: Insufficient documentation

## 2016-09-01 DIAGNOSIS — E118 Type 2 diabetes mellitus with unspecified complications: Secondary | ICD-10-CM

## 2016-09-01 DIAGNOSIS — Z7982 Long term (current) use of aspirin: Secondary | ICD-10-CM | POA: Insufficient documentation

## 2016-09-01 DIAGNOSIS — Z7984 Long term (current) use of oral hypoglycemic drugs: Secondary | ICD-10-CM | POA: Insufficient documentation

## 2016-09-01 DIAGNOSIS — Z1159 Encounter for screening for other viral diseases: Secondary | ICD-10-CM

## 2016-09-01 DIAGNOSIS — Z6836 Body mass index (BMI) 36.0-36.9, adult: Secondary | ICD-10-CM | POA: Insufficient documentation

## 2016-09-01 DIAGNOSIS — I10 Essential (primary) hypertension: Secondary | ICD-10-CM

## 2016-09-01 DIAGNOSIS — Z9119 Patient's noncompliance with other medical treatment and regimen: Secondary | ICD-10-CM | POA: Insufficient documentation

## 2016-09-01 DIAGNOSIS — F17200 Nicotine dependence, unspecified, uncomplicated: Secondary | ICD-10-CM | POA: Insufficient documentation

## 2016-09-01 DIAGNOSIS — G510 Bell's palsy: Secondary | ICD-10-CM | POA: Insufficient documentation

## 2016-09-01 DIAGNOSIS — K219 Gastro-esophageal reflux disease without esophagitis: Secondary | ICD-10-CM | POA: Insufficient documentation

## 2016-09-01 DIAGNOSIS — Z720 Tobacco use: Secondary | ICD-10-CM

## 2016-09-01 DIAGNOSIS — E669 Obesity, unspecified: Secondary | ICD-10-CM | POA: Insufficient documentation

## 2016-09-01 LAB — COMPLETE METABOLIC PANEL WITH GFR
ALT: 17 U/L (ref 9–46)
AST: 16 U/L (ref 10–35)
Albumin: 4 g/dL (ref 3.6–5.1)
Alkaline Phosphatase: 56 U/L (ref 40–115)
BUN: 21 mg/dL (ref 7–25)
CHLORIDE: 101 mmol/L (ref 98–110)
CO2: 27 mmol/L (ref 20–31)
CREATININE: 0.98 mg/dL (ref 0.70–1.25)
Calcium: 9.5 mg/dL (ref 8.6–10.3)
GFR, Est Non African American: 83 mL/min (ref >=60)
Glucose, Bld: 205 mg/dL — ABNORMAL HIGH (ref 65–99)
Potassium: 5.1 mmol/L (ref 3.5–5.3)
Sodium: 135 mmol/L (ref 135–146)
Total Bilirubin: 0.5 mg/dL (ref 0.2–1.2)
Total Protein: 6.7 g/dL (ref 6.1–8.1)

## 2016-09-01 LAB — GLUCOSE, POCT (MANUAL RESULT ENTRY): POC Glucose: 306 mg/dl — AB (ref 70–99)

## 2016-09-01 LAB — POCT GLYCOSYLATED HEMOGLOBIN (HGB A1C): Hemoglobin A1C: 8.2

## 2016-09-01 MED ORDER — LISINOPRIL 10 MG PO TABS: 10.0000 mg | ORAL_TABLET | Freq: Every day | ORAL | 3 refills | Status: DC

## 2016-09-01 MED ORDER — ATORVASTATIN CALCIUM 40 MG PO TABS: 40.0000 mg | ORAL_TABLET | Freq: Every day | ORAL | 3 refills | Status: DC

## 2016-09-01 MED ORDER — METFORMIN HCL 500 MG PO TABS: ORAL_TABLET | ORAL | 3 refills | Status: DC

## 2016-09-01 MED FILL — metFORMIN HCL 500 MG TABS: 500 | 30 days supply | Qty: 120 | Fill #0

## 2016-09-01 MED FILL — ATORVASTATIN 40 MG TABLET: 40 | 30 days supply | Qty: 30 | Fill #0

## 2016-09-01 MED FILL — ?LISINOPRIL 10 MG TABLET: 10 | 30 days supply | Qty: 30 | Fill #0

## 2016-09-01 NOTE — Patient Instructions (Signed)
Diabetes Mellitus and Food It is important for you to manage your blood sugar (glucose) level. Your blood glucose level can be greatly affected by what you eat. Eating healthier foods in the appropriate amounts throughout the day at about the same time each day will help you control your blood glucose level. It can also help slow or prevent worsening of your diabetes mellitus. Healthy eating may even help you improve the level of your blood pressure and reach or maintain a healthy weight. General recommendations for healthful eating and cooking habits include:  Eating meals and snacks regularly. Avoid going long periods of time without eating to lose weight.  Eating a diet that consists mainly of plant-based foods, such as fruits, vegetables, nuts, legumes, and whole grains.  Using low-heat cooking methods, such as baking, instead of high-heat cooking methods, such as deep frying.  Work with your dietitian to make sure you understand how to use the Nutrition Facts information on food labels. How can food affect me? Carbohydrates Carbohydrates affect your blood glucose level more than any other type of food. Your dietitian will help you determine how many carbohydrates to eat at each meal and teach you how to count carbohydrates. Counting carbohydrates is important to keep your blood glucose at a healthy level, especially if you are using insulin or taking certain medicines for diabetes mellitus. Alcohol Alcohol can cause sudden decreases in blood glucose (hypoglycemia), especially if you use insulin or take certain medicines for diabetes mellitus. Hypoglycemia can be a life-threatening condition. Symptoms of hypoglycemia (sleepiness, dizziness, and disorientation) are similar to symptoms of having too much alcohol. If your health care provider has given you approval to drink alcohol, do so in moderation and use the following guidelines:  Women should not have more than one drink per day, and men  should not have more than two drinks per day. One drink is equal to: ? 12 oz of beer. ? 5 oz of wine. ? 1 oz of hard liquor.  Do not drink on an empty stomach.  Keep yourself hydrated. Have water, diet soda, or unsweetened iced tea.  Regular soda, juice, and other mixers might contain a lot of carbohydrates and should be counted.  What foods are not recommended? As you make food choices, it is important to remember that all foods are not the same. Some foods have fewer nutrients per serving than other foods, even though they might have the same number of calories or carbohydrates. It is difficult to get your body what it needs when you eat foods with fewer nutrients. Examples of foods that you should avoid that are high in calories and carbohydrates but low in nutrients include:  Trans fats (most processed foods list trans fats on the Nutrition Facts label).  Regular soda.  Juice.  Candy.  Sweets, such as cake, pie, doughnuts, and cookies.  Fried foods.  What foods can I eat? Eat nutrient-rich foods, which will nourish your body and keep you healthy. The food you should eat also will depend on several factors, including:  The calories you need.  The medicines you take.  Your weight.  Your blood glucose level.  Your blood pressure level.  Your cholesterol level.  You should eat a variety of foods, including:  Protein. ? Lean cuts of meat. ? Proteins low in saturated fats, such as fish, egg whites, and beans. Avoid processed meats.  Fruits and vegetables. ? Fruits and vegetables that may help control blood glucose levels, such as apples,   mangoes, and yams.  Dairy products. ? Choose fat-free or low-fat dairy products, such as milk, yogurt, and cheese.  Grains, bread, pasta, and rice. ? Choose whole grain products, such as multigrain bread, whole oats, and brown rice. These foods may help control blood pressure.  Fats. ? Foods containing healthful fats, such as  nuts, avocado, olive oil, canola oil, and fish.  Does everyone with diabetes mellitus have the same meal plan? Because every person with diabetes mellitus is different, there is not one meal plan that works for everyone. It is very important that you meet with a dietitian who will help you create a meal plan that is just right for you. This information is not intended to replace advice given to you by your health care provider. Make sure you discuss any questions you have with your health care provider. Document Released: 06/03/2005 Document Revised: 02/12/2016 Document Reviewed: 08/03/2013 Elsevier Interactive Patient Education  2017 Elsevier Inc.  

## 2016-09-01 NOTE — Progress Notes (Signed)
Subjective:  Patient ID: Jonathan Miller, male    DOB: 20-Dec-1955  Age: 60 y.o. MRN: [id number]  CC: Follow-up on diabetes mellitus  HPI Jonathan Miller is a 60 year old male with history of type 2 diabetes mellitus (A1c 8.2), hypertension, Bell's palsy, tobacco abuse, obesity who presents to the clinic for follow-up visit.  In 01/2016 he did have a hospitalization for ?stroke as MRA findings revealed moderate left ICA narrowing with a question of artifact however the patient was symptomatic and was thought to have Bell's palsy. At this time he has no residual deficits.  He has been compliant with his medication and is needing refills today. He has not been compliant with exercise or diabetic diet. Not up-to-date on annual eye exam  He has no acute concerns today.  Past Medical History:  Diagnosis Date  . Arthritis    "right shoulder" (02/09/2016)  . Bell's palsy 01/2012; 02/09/2016  . Daily headache    "recently" (02/09/2016)  . GERD (gastroesophageal reflux disease)   . History of hiatal hernia   . HTN (hypertension)   . Obesity   . Tobacco abuse   . Type II diabetes mellitus (HCC)     Past Surgical History:  Procedure Laterality Date  . NO PAST SURGERIES      No Known Allergies   Outpatient Medications Prior to Visit  Medication Sig Dispense Refill  . aspirin 325 MG tablet Take 1 tablet (325 mg total) by mouth daily. 30 tablet 0  . lisinopril (PRINIVIL) 10 MG tablet Take 1 tablet (10 mg total) by mouth daily. Start on 02/14/16 90 tablet 3  . metFORMIN (GLUCOPHAGE) 500 MG tablet TAKE 1 TABLET BY MOUTH 2 TIMES DAILY WITH A MEAL. 60 tablet 0  . nicotine (NICODERM CQ - DOSED IN MG/24 HOURS) 21 mg/24hr patch Place 1 patch (21 mg total) onto the skin daily. (Patient not taking: Reported on 09/01/2016) 28 patch 0  . artificial tears (LACRILUBE) OINT ophthalmic ointment Place into the left eye at bedtime as needed for dry eyes. (Patient not taking: Reported on 09/01/2016) 1 Tube 0  .  atorvastatin (LIPITOR) 40 MG tablet Take 1 tablet (40 mg total) by mouth daily at 6 PM. (Patient not taking: Reported on 09/01/2016) 90 tablet 3  . hydroxypropyl methylcellulose / hypromellose (ISOPTO TEARS / GONIOVISC) 2.5 % ophthalmic solution Place 2 drops into the left eye as needed for dry eyes. (Patient not taking: Reported on 09/01/2016) 15 mL 12  . senna-docusate (SENOKOT-S) 8.6-50 MG tablet Take 1 tablet by mouth at bedtime as needed for moderate constipation. (Patient not taking: Reported on 09/01/2016) 30 tablet 0  . valACYclovir (VALTREX) 1000 MG tablet Take 1 tablet (1,000 mg total) by mouth 3 (three) times daily. (Patient not taking: Reported on 09/01/2016) 21 tablet 0   No facility-administered medications prior to visit.     ROS Review of Systems  Constitutional: Negative for activity change and appetite change.  HENT: Negative for sinus pressure and sore throat.   Eyes: Negative for visual disturbance.  Respiratory: Negative for cough, chest tightness and shortness of breath.   Cardiovascular: Negative for chest pain and leg swelling.  Gastrointestinal: Negative for abdominal distention, abdominal pain, constipation and diarrhea.  Endocrine: Negative.   Genitourinary: Negative for dysuria.  Musculoskeletal: Negative for joint swelling and myalgias.  Skin: Negative for rash.  Allergic/Immunologic: Negative.   Neurological: Negative for weakness, light-headedness and numbness.  Psychiatric/Behavioral: Negative for dysphoric mood and suicidal ideas.  Objective:  BP 124/72 (BP Location: Right Arm, Patient Position: Sitting, Cuff Size: Large)   Pulse 84   Temp 98.4 F (36.9 C) (Oral)   Ht 5\' 5"  (1.651 m)   Wt 217 lb 3.2 oz (98.5 kg)   SpO2 93%   BMI 36.14 kg/m   BP/Weight 09/01/2016 02/11/2016 02/10/2016  Systolic BP 124 158 131  Diastolic BP 72 96 71  Wt. (Lbs) 217.2 218 217  BMI 36.14 33.15 36.11      Physical Exam  Constitutional: He is oriented to person,  place, and time. He appears well-developed and well-nourished.  Neck: No JVD present.  Cardiovascular: Normal rate, normal heart sounds and intact distal pulses.   No murmur heard. Pulmonary/Chest: Effort normal and breath sounds normal. He has no wheezes. He has no rales. He exhibits no tenderness.  Abdominal: Soft. Bowel sounds are normal. He exhibits no distension and no mass. There is no tenderness.  Musculoskeletal: Normal range of motion.  Neurological: He is alert and oriented to person, place, and time.  Skin: Skin is warm and dry.  Psychiatric: He has a normal mood and affect.     Lab Results  Component Value Date   HGBA1C 8.2 09/01/2016    CMP Latest Ref Rng & Units 02/08/2016 02/08/2016 01/22/2012  Glucose 65 - 99 mg/dL 191(Y234(H) 782(N241(H) 562(Z245(H)  BUN 6 - 20 mg/dL 18 15 17   Creatinine 0.61 - 1.24 mg/dL 3.080.70 6.570.80 8.460.67  Sodium 135 - 145 mmol/L 139 134(L) 134(L)  Potassium 3.5 - 5.1 mmol/L 4.0 3.8 4.5  Chloride 101 - 111 mmol/L 101 101 99  CO2 22 - 32 mmol/L - 25 25  Calcium 8.9 - 10.3 mg/dL - 9.0 9.3  Total Protein 6.5 - 8.1 g/dL - 6.4(L) -  Total Bilirubin 0.3 - 1.2 mg/dL - 0.4 -  Alkaline Phos 38 - 126 U/L - 64 -  AST 15 - 41 U/L - 20 -  ALT 17 - 63 U/L - 21 -     Assessment & Plan:   1. Diabetes mellitus with complication (HCC) Uncontrolled with A1c of 8.2 Increased dose of metformin ADA diet Keep blood sugar logs with fasting goals of 80-120 mg/dl, random of less than [id number] and in the event of sugars less than 60 mg/dl or greater than 952400 mg/dl please notify the clinic ASAP. It is recommended that you undergo annual eye exams and annual foot exams. Pneumovax is recommended every 5 years before the age of 60 and once for a lifetime at or after the age of 60. - Glucose (CBG) - HgB A1c - metFORMIN (GLUCOPHAGE) 500 MG tablet; TAKE 2 TABLET BY MOUTH 2 TIMES DAILY WITH A MEAL.  Dispense: 120 tablet; Refill: 3 - atorvastatin (LIPITOR) 40 MG tablet; Take 1 tablet (40 mg total)  by mouth daily at 6 PM.  Dispense: 30 tablet; Refill: 3 - COMPLETE METABOLIC PANEL WITH GFR  2. Screening for viral disease - Hepatitis C antibody, reflex - HIV antibody (with reflex)  3. Essential hypertension Controlled Low-sodium diet - lisinopril (PRINIVIL) 10 MG tablet; Take 1 tablet (10 mg total) by mouth daily. Start on 02/14/16  Dispense: 90 tablet; Refill: 3  4. Tobacco abuse Spent 4 minutes counseling on cessation He is not ready to quit this time  5. Class 2 obesity due to excess calories without serious comorbidity with body mass index (BMI) of 39.0 to 39.9 in adult Discussed reducing portion sizes, exercises   Meds ordered this encounter  Medications  .  metFORMIN (GLUCOPHAGE) 500 MG tablet    Sig: TAKE 2 TABLET BY MOUTH 2 TIMES DAILY WITH A MEAL.    Dispense:  120 tablet    Refill:  3  . atorvastatin (LIPITOR) 40 MG tablet    Sig: Take 1 tablet (40 mg total) by mouth daily at 6 PM.    Dispense:  30 tablet    Refill:  3  . lisinopril (PRINIVIL) 10 MG tablet    Sig: Take 1 tablet (10 mg total) by mouth daily. Start on 02/14/16    Dispense:  90 tablet    Refill:  3    Follow-up: Return in about 3 months (around 11/30/2016) for Follow-up on diabetes mellitus.   Jaclyn ShaggyEnobong Amao MD

## 2016-09-02 ENCOUNTER — Telehealth: Payer: Self-pay

## 2016-09-02 LAB — HEPATITIS C ANTIBODY: HCV Ab: NEGATIVE

## 2016-09-02 LAB — HIV ANTIBODY (ROUTINE TESTING W REFLEX): HIV: NONREACTIVE

## 2016-09-02 NOTE — Telephone Encounter (Signed)
-----   Message from Jaclyn ShaggyEnobong Amao, MD sent at 09/02/2016  8:30 AM EST ----- Hep C, HIV negative

## 2016-09-02 NOTE — Telephone Encounter (Signed)
Writer spoke with patient per Dr. Venetia NightAmao regarding his lab results.  Patient stated understanding.

## 2016-10-08 ENCOUNTER — Other Ambulatory Visit: Payer: Self-pay | Admitting: Internal Medicine

## 2016-10-08 MED FILL — metFORMIN HCL 500 MG TABS: 500 | 30 days supply | Qty: 120 | Fill #1

## 2016-10-08 MED FILL — ?LISINOPRIL 10 MG TABLET: 10 | 30 days supply | Qty: 30 | Fill #1

## 2016-11-12 MED FILL — metFORMIN HCL 500 MG TABS: 500 | 30 days supply | Qty: 120 | Fill #2

## 2016-11-12 MED FILL — ?LISINOPRIL 10 MG TABLET: 10 | 30 days supply | Qty: 30 | Fill #2

## 2016-11-30 ENCOUNTER — Ambulatory Visit: Payer: Self-pay | Attending: Family Medicine | Admitting: Family Medicine

## 2016-11-30 ENCOUNTER — Encounter: Payer: Self-pay | Admitting: Family Medicine

## 2016-11-30 VITALS — BP 130/80 | HR 88 | Temp 98.2°F | Ht 65.0 in | Wt 209.2 lb

## 2016-11-30 DIAGNOSIS — Z7984 Long term (current) use of oral hypoglycemic drugs: Secondary | ICD-10-CM | POA: Insufficient documentation

## 2016-11-30 DIAGNOSIS — Z79899 Other long term (current) drug therapy: Secondary | ICD-10-CM | POA: Insufficient documentation

## 2016-11-30 DIAGNOSIS — I10 Essential (primary) hypertension: Secondary | ICD-10-CM | POA: Insufficient documentation

## 2016-11-30 DIAGNOSIS — Z6834 Body mass index (BMI) 34.0-34.9, adult: Secondary | ICD-10-CM | POA: Insufficient documentation

## 2016-11-30 DIAGNOSIS — E785 Hyperlipidemia, unspecified: Secondary | ICD-10-CM | POA: Insufficient documentation

## 2016-11-30 DIAGNOSIS — F17200 Nicotine dependence, unspecified, uncomplicated: Secondary | ICD-10-CM | POA: Insufficient documentation

## 2016-11-30 DIAGNOSIS — Z7982 Long term (current) use of aspirin: Secondary | ICD-10-CM | POA: Insufficient documentation

## 2016-11-30 DIAGNOSIS — E7800 Pure hypercholesterolemia, unspecified: Secondary | ICD-10-CM | POA: Insufficient documentation

## 2016-11-30 DIAGNOSIS — E669 Obesity, unspecified: Secondary | ICD-10-CM | POA: Insufficient documentation

## 2016-11-30 DIAGNOSIS — K219 Gastro-esophageal reflux disease without esophagitis: Secondary | ICD-10-CM | POA: Insufficient documentation

## 2016-11-30 DIAGNOSIS — E118 Type 2 diabetes mellitus with unspecified complications: Secondary | ICD-10-CM | POA: Insufficient documentation

## 2016-11-30 DIAGNOSIS — Z720 Tobacco use: Secondary | ICD-10-CM

## 2016-11-30 LAB — GLUCOSE, POCT (MANUAL RESULT ENTRY): POC Glucose: 141 mg/dl — AB (ref 70–99)

## 2016-11-30 LAB — POCT GLYCOSYLATED HEMOGLOBIN (HGB A1C): Hemoglobin A1C: 7.1

## 2016-11-30 MED ORDER — LISINOPRIL 10 MG PO TABS: 10.0000 mg | ORAL_TABLET | Freq: Every day | ORAL | 3 refills | Status: DC

## 2016-11-30 MED ORDER — BUPROPION HCL ER (SR) 150 MG PO TB12: 150.0000 mg | ORAL_TABLET | Freq: Two times a day (BID) | ORAL | 1 refills | Status: DC

## 2016-11-30 MED ORDER — METFORMIN HCL 500 MG PO TABS: ORAL_TABLET | ORAL | 3 refills | Status: DC

## 2016-11-30 MED ORDER — ATORVASTATIN CALCIUM 40 MG PO TABS: 40.0000 mg | ORAL_TABLET | Freq: Every day | ORAL | 3 refills | Status: DC

## 2016-11-30 MED FILL — ?BUPROPION HCL SR 150 MG TA: 150 | 30 days supply | Qty: 60 | Fill #0

## 2016-11-30 MED FILL — ATORVASTATIN 40 MG TABLET: 40 | 30 days supply | Qty: 30 | Fill #0

## 2016-11-30 NOTE — Progress Notes (Signed)
Subjective:  Patient ID: Jonathan Miller, male    DOB: Jan 20, 1956  Age: 61 y.o. MRN: [id number]  CC: Diabetes and Hypertension   HPI Jonathan Miller is a 61 year old male with history of type 2 diabetes mellitus (A1c 7.1 from today which has improved from 8.2), hypertension,, tobacco abuse, obesity who presents to the clinic for follow-up visit.  He has been compliant with his medication and is needing refills today. Denies hypoglycemia, numbness in extremities or visual complaints. He has not been compliant with exercise or diabetic diet. Not up-to-date on annual eye exam  He would like to be placed on Wellbutrin to help with smoking cessation. Of note he is sad because he just got laid off.  Past Medical History:  Diagnosis Date  . Arthritis    "right shoulder" (02/09/2016)  . Bell's palsy 01/2012; 02/09/2016  . Daily headache    "recently" (02/09/2016)  . GERD (gastroesophageal reflux disease)   . History of hiatal hernia   . HTN (hypertension)   . Obesity   . Tobacco abuse   . Type II diabetes mellitus (HCC)     Past Surgical History:  Procedure Laterality Date  . NO PAST SURGERIES      No Known Allergies   Outpatient Medications Prior to Visit  Medication Sig Dispense Refill  . aspirin EC 81 MG tablet Take 81 mg by mouth daily.    Marland Kitchen atorvastatin (LIPITOR) 40 MG tablet Take 1 tablet (40 mg total) by mouth daily at 6 PM. 30 tablet 3  . lisinopril (PRINIVIL) 10 MG tablet Take 1 tablet (10 mg total) by mouth daily. Start on 02/14/16 90 tablet 3  . metFORMIN (GLUCOPHAGE) 500 MG tablet TAKE 2 TABLET BY MOUTH 2 TIMES DAILY WITH A MEAL. 120 tablet 3  . aspirin 325 MG tablet Take 1 tablet (325 mg total) by mouth daily. (Patient not taking: Reported on 11/30/2016) 30 tablet 0  . nicotine (NICODERM CQ - DOSED IN MG/24 HOURS) 21 mg/24hr patch Place 1 patch (21 mg total) onto the skin daily. (Patient not taking: Reported on 09/01/2016) 28 patch 0   No facility-administered medications  prior to visit.     ROS Review of Systems  Constitutional: Negative for activity change and appetite change.  HENT: Negative for sinus pressure and sore throat.   Eyes: Negative for visual disturbance.  Respiratory: Negative for cough, chest tightness and shortness of breath.   Cardiovascular: Negative for chest pain and leg swelling.  Gastrointestinal: Negative for abdominal distention, abdominal pain, constipation and diarrhea.  Endocrine: Negative.   Genitourinary: Negative for dysuria.  Musculoskeletal: Negative for joint swelling and myalgias.  Skin: Negative for rash.  Allergic/Immunologic: Negative.   Neurological: Negative for weakness, light-headedness and numbness.  Psychiatric/Behavioral: Negative for dysphoric mood and suicidal ideas.    Objective:  BP 130/80 (BP Location: Right Arm, Patient Position: Sitting, Cuff Size: Small)   Pulse 88   Temp 98.2 F (36.8 C) (Oral)   Ht 5\' 5"  (1.651 m)   Wt 209 lb 3.2 oz (94.9 kg)   BMI 34.81 kg/m   BP/Weight 11/30/2016 09/01/2016 02/11/2016  Systolic BP 130 124 158  Diastolic BP 80 72 96  Wt. (Lbs) 209.2 217.2 218  BMI 34.81 36.14 33.15      Physical Exam  Constitutional: He is oriented to person, place, and time. He appears well-developed and well-nourished.  HENT:  Right Ear: External ear normal.  Left Ear: External ear normal.  Mouth/Throat: Oropharynx is clear  and moist.  Cardiovascular: Normal rate, normal heart sounds and intact distal pulses.   No murmur heard. Pulmonary/Chest: Effort normal and breath sounds normal. He has no wheezes. He has no rales. He exhibits no tenderness.  Abdominal: Soft. Bowel sounds are normal. He exhibits no distension and no mass. There is no tenderness.  Musculoskeletal: Normal range of motion.  Neurological: He is alert and oriented to person, place, and time.  Skin: Skin is warm and dry.  Psychiatric: He has a normal mood and affect.    Lab Results  Component Value Date    HGBA1C 7.1 11/30/2016    Lipid Panel     Component Value Date/Time   CHOL 219 (H) 02/09/2016 0731   TRIG 205 (H) 02/09/2016 0731   HDL 42 02/09/2016 0731   CHOLHDL 5.2 02/09/2016 0731   VLDL 41 (H) 02/09/2016 0731   LDLCALC 136 (H) 02/09/2016 0731    CMP Latest Ref Rng & Units 09/01/2016 02/08/2016 02/08/2016  Glucose 65 - 99 mg/dL 960(A205(H) 540(J234(H) 811(B241(H)  BUN 7 - 25 mg/dL 21 18 15   Creatinine 0.70 - 1.25 mg/dL 1.470.98 8.290.70 5.620.80  Sodium 135 - 146 mmol/L 135 139 134(L)  Potassium 3.5 - 5.3 mmol/L 5.1 4.0 3.8  Chloride 98 - 110 mmol/L 101 101 101  CO2 20 - 31 mmol/L 27 - 25  Calcium 8.6 - 10.3 mg/dL 9.5 - 9.0  Total Protein 6.1 - 8.1 g/dL 6.7 - 6.4(L)  Total Bilirubin 0.2 - 1.2 mg/dL 0.5 - 0.4  Alkaline Phos 40 - 115 U/L 56 - 64  AST 10 - 35 U/L 16 - 20  ALT 9 - 46 U/L 17 - 21     Assessment & Plan:   1. Diabetes mellitus with complication (HCC) Controlled with A1c of 7.1 Continue medications, diabetic diet, lifestyle modifications Encouraged to set up ophthalmology appointment for annual eye exam - Glucose (CBG) - HgB A1c - COMPLETE METABOLIC PANEL WITH GFR; Future - Lipid Panel w/reflex Direct LDL; Future - Microalbumin / creatinine urine ratio; Future - metFORMIN (GLUCOPHAGE) 500 MG tablet; TAKE 2 TABLET BY MOUTH 2 TIMES DAILY WITH A MEAL.  Dispense: 120 tablet; Refill: 3  2. Essential hypertension Controlled Low-sodium, DASH diet - lisinopril (PRINIVIL) 10 MG tablet; Take 1 tablet (10 mg total) by mouth daily.  Dispense: 30 tablet; Refill: 3  3. Tobacco abuse Spent 3 minutes counseling on cessation and he is willing to quit - buPROPion (WELLBUTRIN SR) 150 MG 12 hr tablet; Take 1 tablet (150 mg total) by mouth 2 (two) times daily. For smoking cessation  Dispense: 60 tablet; Refill: 1  4. Pure hypercholesterolemia LDL is 136 which is above goal of less than 100 Low-cholesterol diet,lifestyle modifications - atorvastatin (LIPITOR) 40 MG tablet; Take 1 tablet (40 mg  total) by mouth daily at 6 PM.  Dispense: 30 tablet; Refill: 3   Meds ordered this encounter  Medications  . atorvastatin (LIPITOR) 40 MG tablet    Sig: Take 1 tablet (40 mg total) by mouth daily at 6 PM.    Dispense:  30 tablet    Refill:  3  . lisinopril (PRINIVIL) 10 MG tablet    Sig: Take 1 tablet (10 mg total) by mouth daily.    Dispense:  30 tablet    Refill:  3  . metFORMIN (GLUCOPHAGE) 500 MG tablet    Sig: TAKE 2 TABLET BY MOUTH 2 TIMES DAILY WITH A MEAL.    Dispense:  120 tablet  Refill:  3  . buPROPion (WELLBUTRIN SR) 150 MG 12 hr tablet    Sig: Take 1 tablet (150 mg total) by mouth 2 (two) times daily. For smoking cessation    Dispense:  60 tablet    Refill:  1    Follow-up: Return in about 3 months (around 03/02/2017) for Follow-up on diabetes mellitus.   Jaclyn Shaggy MD

## 2016-11-30 NOTE — Patient Instructions (Signed)
Steps to Quit Smoking Smoking tobacco can be bad for your health. It can also affect almost every organ in your body. Smoking puts you and people around you at risk for many serious long-lasting (chronic) diseases. Quitting smoking is hard, but it is one of the best things that you can do for your health. It is never too late to quit. What are the benefits of quitting smoking? When you quit smoking, you lower your risk for getting serious diseases and conditions. They can include:  Lung cancer or lung disease.  Heart disease.  Stroke.  Heart attack.  Not being able to have children (infertility).  Weak bones (osteoporosis) and broken bones (fractures). If you have coughing, wheezing, and shortness of breath, those symptoms may get better when you quit. You may also get sick less often. If you are pregnant, quitting smoking can help to lower your chances of having a baby of low birth weight. What can I do to help me quit smoking? Talk with your doctor about what can help you quit smoking. Some things you can do (strategies) include:  Quitting smoking totally, instead of slowly cutting back how much you smoke over a period of time.  Going to in-person counseling. You are more likely to quit if you go to many counseling sessions.  Using resources and support systems, such as:  Online chats with a counselor.  Phone quitlines.  Printed self-help materials.  Support groups or group counseling.  Text messaging programs.  Mobile phone apps or applications.  Taking medicines. Some of these medicines may have nicotine in them. If you are pregnant or breastfeeding, do not take any medicines to quit smoking unless your doctor says it is okay. Talk with your doctor about counseling or other things that can help you. Talk with your doctor about using more than one strategy at the same time, such as taking medicines while you are also going to in-person counseling. This can help make quitting  easier. What things can I do to make it easier to quit? Quitting smoking might feel very hard at first, but there is a lot that you can do to make it easier. Take these steps:  Talk to your family and friends. Ask them to support and encourage you.  Call phone quitlines, reach out to support groups, or work with a counselor.  Ask people who smoke to not smoke around you.  Avoid places that make you want (trigger) to smoke, such as:  Bars.  Parties.  Smoke-break areas at work.  Spend time with people who do not smoke.  Lower the stress in your life. Stress can make you want to smoke. Try these things to help your stress:  Getting regular exercise.  Deep-breathing exercises.  Yoga.  Meditating.  Doing a body scan. To do this, close your eyes, focus on one area of your body at a time from head to toe, and notice which parts of your body are tense. Try to relax the muscles in those areas.  Download or buy apps on your mobile phone or tablet that can help you stick to your quit plan. There are many free apps, such as QuitGuide from the CDC (Centers for Disease Control and Prevention). You can find more support from smokefree.gov and other websites. This information is not intended to replace advice given to you by your health care provider. Make sure you discuss any questions you have with your health care provider. Document Released: 07/03/2009 Document Revised: 05/04/2016 Document   Reviewed: 01/21/2015 Elsevier Interactive Patient Education  2017 Elsevier Inc.  

## 2016-12-06 ENCOUNTER — Ambulatory Visit: Payer: Self-pay | Attending: Family Medicine

## 2016-12-06 DIAGNOSIS — E118 Type 2 diabetes mellitus with unspecified complications: Secondary | ICD-10-CM | POA: Insufficient documentation

## 2016-12-06 LAB — COMPLETE METABOLIC PANEL WITH GFR
ALBUMIN: 4.1 g/dL (ref 3.6–5.1)
ALK PHOS: 56 U/L (ref 40–115)
ALT: 17 U/L (ref 9–46)
AST: 15 U/L (ref 10–35)
BUN: 28 mg/dL — ABNORMAL HIGH (ref 7–25)
CALCIUM: 9.7 mg/dL (ref 8.6–10.3)
CHLORIDE: 105 mmol/L (ref 98–110)
CO2: 25 mmol/L (ref 20–31)
CREATININE: 0.93 mg/dL (ref 0.70–1.25)
GFR, Est Non African American: 88 mL/min (ref >=60)
Glucose, Bld: 130 mg/dL — ABNORMAL HIGH (ref 65–99)
POTASSIUM: 5 mmol/L (ref 3.5–5.3)
Sodium: 136 mmol/L (ref 135–146)
Total Bilirubin: 0.5 mg/dL (ref 0.2–1.2)
Total Protein: 6.4 g/dL (ref 6.1–8.1)

## 2016-12-06 LAB — LIPID PANEL W/REFLEX DIRECT LDL
CHOLESTEROL: 167 mg/dL (ref <200)
HDL: 48 mg/dL (ref >40)
LDL-Cholesterol: 93 mg/dL
NON-HDL CHOLESTEROL (CALC): 119 mg/dL (ref <130)
TRIGLYCERIDES: 166 mg/dL — AB (ref <150)
Total CHOL/HDL Ratio: 3.5 Ratio (ref <5.0)

## 2016-12-06 NOTE — Progress Notes (Signed)
Patient here for lab visit only 

## 2016-12-07 LAB — MICROALBUMIN / CREATININE URINE RATIO
Creatinine, Urine: 116 mg/dL (ref 20–370)
MICROALB/CREAT RATIO: 149 ug/mg{creat} — AB (ref <30)
Microalb, Ur: 17.3 mg/dL

## 2016-12-09 MED FILL — metFORMIN HCL 500 MG TABS: 500 | 30 days supply | Qty: 120 | Fill #3

## 2016-12-09 MED FILL — ?LISINOPRIL 10 MG TABLET: 10 | 30 days supply | Qty: 30 | Fill #3

## 2016-12-20 ENCOUNTER — Telehealth: Payer: Self-pay

## 2016-12-20 NOTE — Telephone Encounter (Signed)
-----   Message from Jaclyn Shaggy, MD sent at 12/07/2016 12:26 PM EDT ----- Electrolytes are normal; cholesterol is normal and has improved from previous labs. He does have some microalbuminuria which could be secondary to early signs of diabetes affecting the kidneys. Optimal glycemic control will help this.

## 2016-12-20 NOTE — Telephone Encounter (Signed)
Writer called patient and discussed labs.  Patient stated understanding.

## 2017-01-13 MED FILL — ?LISINOPRIL 10 MG TABLET: 10 | 30 days supply | Qty: 30 | Fill #4

## 2017-01-13 MED FILL — ?METFORMIN HCL 500MG TABLET: 500 | 30 days supply | Qty: 120 | Fill #0

## 2017-02-15 MED FILL — ?METFORMIN HCL 500MG TABLET: 500 | 30 days supply | Qty: 120 | Fill #1

## 2017-02-15 MED FILL — ?LISINOPRIL 10 MG TABLET: 10 | 30 days supply | Qty: 30 | Fill #5

## 2017-03-02 ENCOUNTER — Encounter: Payer: Self-pay | Admitting: Family Medicine

## 2017-03-02 ENCOUNTER — Ambulatory Visit: Payer: Self-pay | Attending: Family Medicine | Admitting: Family Medicine

## 2017-03-02 VITALS — BP 126/70 | HR 86 | Temp 98.0°F | Resp 16 | Wt 204.4 lb

## 2017-03-02 DIAGNOSIS — E7800 Pure hypercholesterolemia, unspecified: Secondary | ICD-10-CM | POA: Insufficient documentation

## 2017-03-02 DIAGNOSIS — Z7984 Long term (current) use of oral hypoglycemic drugs: Secondary | ICD-10-CM | POA: Insufficient documentation

## 2017-03-02 DIAGNOSIS — E119 Type 2 diabetes mellitus without complications: Secondary | ICD-10-CM | POA: Insufficient documentation

## 2017-03-02 DIAGNOSIS — F17210 Nicotine dependence, cigarettes, uncomplicated: Secondary | ICD-10-CM | POA: Insufficient documentation

## 2017-03-02 DIAGNOSIS — E118 Type 2 diabetes mellitus with unspecified complications: Secondary | ICD-10-CM

## 2017-03-02 DIAGNOSIS — Z7982 Long term (current) use of aspirin: Secondary | ICD-10-CM | POA: Insufficient documentation

## 2017-03-02 DIAGNOSIS — I10 Essential (primary) hypertension: Secondary | ICD-10-CM | POA: Insufficient documentation

## 2017-03-02 DIAGNOSIS — K219 Gastro-esophageal reflux disease without esophagitis: Secondary | ICD-10-CM | POA: Insufficient documentation

## 2017-03-02 DIAGNOSIS — Z720 Tobacco use: Secondary | ICD-10-CM

## 2017-03-02 DIAGNOSIS — E669 Obesity, unspecified: Secondary | ICD-10-CM | POA: Insufficient documentation

## 2017-03-02 DIAGNOSIS — K449 Diaphragmatic hernia without obstruction or gangrene: Secondary | ICD-10-CM | POA: Insufficient documentation

## 2017-03-02 LAB — GLUCOSE, POCT (MANUAL RESULT ENTRY): POC Glucose: 99 mg/dl (ref 70–99)

## 2017-03-02 LAB — POCT GLYCOSYLATED HEMOGLOBIN (HGB A1C): Hemoglobin A1C: 6.7

## 2017-03-02 MED ORDER — LISINOPRIL 10 MG PO TABS: 10.0000 mg | ORAL_TABLET | Freq: Every day | ORAL | 3 refills | Status: DC

## 2017-03-02 MED ORDER — ATORVASTATIN CALCIUM 40 MG PO TABS: 40.0000 mg | ORAL_TABLET | Freq: Every day | ORAL | 3 refills | Status: DC

## 2017-03-02 MED ORDER — METFORMIN HCL 500 MG PO TABS: ORAL_TABLET | ORAL | 3 refills | Status: DC

## 2017-03-02 NOTE — Progress Notes (Signed)
Subjective:  Patient ID: Jonathan Miller, male    DOB: Mar 22, 1956  Age: 61 y.o. MRN: [id number]  CC: Diabetes   HPI Jonathan Miller s a 62 year old male with history of type 2 diabetes mellitus (A1c 6.7 from today which has improved from 7.1), hypertension,, tobacco abuse, obesity who presents to the clinic for follow-up visit.  I had placed him on Wellbutrin at his last visit for smoking cessation however that was ineffective as he continues to smoke 1-1/2 packs of cigarettes per day. He would like Wellbutrin discontinued and is working on quitting on his own.  He has been compliant with his medication and is needing refills today. Denies hypoglycemia, numbness in extremities or visual complaints. He has not been compliant with exercise or diabetic diet. Not up-to-date on annual eye exam  Does not exercise much but tries to adhere to the diabetic diet. He is currently job hunting with options of going out of state if he discovers new opportunities opportunities.  Past Medical History:  Diagnosis Date  . Arthritis    "right shoulder" (02/09/2016)  . Bell's palsy 01/2012; 02/09/2016  . Daily headache    "recently" (02/09/2016)  . GERD (gastroesophageal reflux disease)   . History of hiatal hernia   . HTN (hypertension)   . Obesity   . Tobacco abuse   . Type II diabetes mellitus (HCC)     Past Surgical History:  Procedure Laterality Date  . NO PAST SURGERIES      No Known Allergies  Outpatient Medications Prior to Visit  Medication Sig Dispense Refill  . aspirin 325 MG tablet Take 1 tablet (325 mg total) by mouth daily. (Patient not taking: Reported on 11/30/2016) 30 tablet 0  . aspirin EC 81 MG tablet Take 81 mg by mouth daily.    Marland Kitchen atorvastatin (LIPITOR) 40 MG tablet Take 1 tablet (40 mg total) by mouth daily at 6 PM. 30 tablet 3  . buPROPion (WELLBUTRIN SR) 150 MG 12 hr tablet Take 1 tablet (150 mg total) by mouth 2 (two) times daily. For smoking cessation 60 tablet 1  .  lisinopril (PRINIVIL) 10 MG tablet Take 1 tablet (10 mg total) by mouth daily. 30 tablet 3  . metFORMIN (GLUCOPHAGE) 500 MG tablet TAKE 2 TABLET BY MOUTH 2 TIMES DAILY WITH A MEAL. 120 tablet 3   No facility-administered medications prior to visit.     ROS Review of Systems  Constitutional: Negative for activity change and appetite change.  HENT: Negative for sinus pressure and sore throat.   Eyes: Negative for visual disturbance.  Respiratory: Negative for cough, chest tightness and shortness of breath.   Cardiovascular: Negative for chest pain and leg swelling.  Gastrointestinal: Negative for abdominal distention, abdominal pain, constipation and diarrhea.  Endocrine: Negative.   Genitourinary: Negative for dysuria.  Musculoskeletal: Negative for joint swelling and myalgias.  Skin: Negative for rash.  Allergic/Immunologic: Negative.   Neurological: Negative for weakness, light-headedness and numbness.  Psychiatric/Behavioral: Negative for dysphoric mood and suicidal ideas.    Objective:  BP 126/70   Pulse 86   Temp 98 F (36.7 C) (Oral)   Resp 16   Wt 204 lb 6.4 oz (92.7 kg)   SpO2 96%   BMI 34.01 kg/m   BP/Weight 03/02/2017 11/30/2016 09/01/2016  Systolic BP 126 130 124  Diastolic BP 70 80 72  Wt. (Lbs) 204.4 209.2 217.2  BMI 34.01 34.81 36.14      Physical Exam  Constitutional: He is oriented  to person, place, and time. He appears well-developed and well-nourished.  Cardiovascular: Normal rate, normal heart sounds and intact distal pulses.   No murmur heard. Pulmonary/Chest: Effort normal and breath sounds normal. He has no wheezes. He has no rales. He exhibits no tenderness.  Abdominal: Soft. Bowel sounds are normal. He exhibits no distension and no mass. There is no tenderness.  Musculoskeletal: Normal range of motion.  Neurological: He is alert and oriented to person, place, and time.  Skin: Skin is warm and dry.  Psychiatric: He has a normal mood and affect.        CMP Latest Ref Rng & Units 12/06/2016 09/01/2016 02/08/2016  Glucose 65 - 99 mg/dL 161(W) 960(A) 540(J)  BUN 7 - 25 mg/dL 81(X) 21 18  Creatinine 0.70 - 1.25 mg/dL 9.14 7.82 9.56  Sodium 135 - 146 mmol/L 136 135 139  Potassium 3.5 - 5.3 mmol/L 5.0 5.1 4.0  Chloride 98 - 110 mmol/L 105 101 101  CO2 20 - 31 mmol/L 25 27 -  Calcium 8.6 - 10.3 mg/dL 9.7 9.5 -  Total Protein 6.1 - 8.1 g/dL 6.4 6.7 -  Total Bilirubin 0.2 - 1.2 mg/dL 0.5 0.5 -  Alkaline Phos 40 - 115 U/L 56 56 -  AST 10 - 35 U/L 15 16 -  ALT 9 - 46 U/L 17 17 -    Lipid Panel     Component Value Date/Time   CHOL 167 12/06/2016 0838   TRIG 166 (H) 12/06/2016 0838   HDL 48 12/06/2016 0838   CHOLHDL 3.5 12/06/2016 0838   VLDL 41 (H) 02/09/2016 0731   LDLCALC 136 (H) 02/09/2016 0731    Lab Results  Component Value Date   HGBA1C 6.7 03/02/2017    Assessment & Plan:   1. Diabetes mellitus with complication (HCC) Controlled with A1c of 6.7 Continue medications Advised to schedule a normal eye exam - provided with community resources as he has no medical coverage - POCT glucose (manual entry) - HgB A1c - metFORMIN (GLUCOPHAGE) 500 MG tablet; TAKE 2 TABLET BY MOUTH 2 TIMES DAILY WITH A MEAL.  Dispense: 120 tablet; Refill: 3  2. Essential hypertension Controlled Low-sodium diet - lisinopril (PRINIVIL) 10 MG tablet; Take 1 tablet (10 mg total) by mouth daily.  Dispense: 30 tablet; Refill: 3  3. Pure hypercholesterolemia Uncontrolled with elevated LDL Lipid panel at next visit Low cholesterol diet - atorvastatin (LIPITOR) 40 MG tablet; Take 1 tablet (40 mg total) by mouth daily at 6 PM.  Dispense: 30 tablet; Refill: 3  4. Tobacco abuse Continues to smoke-spent 3 minutes counseling on cessation Failed therapy with Wellbutrin He is working on quitting   Meds ordered this encounter  Medications  . DISCONTD: metFORMIN (GLUCOPHAGE) 500 MG tablet    Sig: TAKE 2 TABLET BY MOUTH 2 TIMES DAILY WITH A MEAL.     Dispense:  120 tablet    Refill:  3  . DISCONTD: lisinopril (PRINIVIL) 10 MG tablet    Sig: Take 1 tablet (10 mg total) by mouth daily.    Dispense:  30 tablet    Refill:  3  . DISCONTD: atorvastatin (LIPITOR) 40 MG tablet    Sig: Take 1 tablet (40 mg total) by mouth daily at 6 PM.    Dispense:  30 tablet    Refill:  3  . atorvastatin (LIPITOR) 40 MG tablet    Sig: Take 1 tablet (40 mg total) by mouth daily at 6 PM.    Dispense:  30 tablet    Refill:  3  . lisinopril (PRINIVIL) 10 MG tablet    Sig: Take 1 tablet (10 mg total) by mouth daily.    Dispense:  30 tablet    Refill:  3  . metFORMIN (GLUCOPHAGE) 500 MG tablet    Sig: TAKE 2 TABLET BY MOUTH 2 TIMES DAILY WITH A MEAL.    Dispense:  120 tablet    Refill:  3    Follow-up: Return in about 3 months (around 06/02/2017) for Follow-up on diabetes mellitus.   This note has been created with Education officer, [employer]Dragon speech recognition software and smart phrase technology. Any transcriptional errors are unintentional.     Jaclyn ShaggyEnobong Amao MD

## 2017-03-02 NOTE — Patient Instructions (Signed)
Diabetes Mellitus and Food It is important for you to manage your blood sugar (glucose) level. Your blood glucose level can be greatly affected by what you eat. Eating healthier foods in the appropriate amounts throughout the day at about the same time each day will help you control your blood glucose level. It can also help slow or prevent worsening of your diabetes mellitus. Healthy eating may even help you improve the level of your blood pressure and reach or maintain a healthy weight. General recommendations for healthful eating and cooking habits include:  Eating meals and snacks regularly. Avoid going long periods of time without eating to lose weight.  Eating a diet that consists mainly of plant-based foods, such as fruits, vegetables, nuts, legumes, and whole grains.  Using low-heat cooking methods, such as baking, instead of high-heat cooking methods, such as deep frying.  Work with your dietitian to make sure you understand how to use the Nutrition Facts information on food labels. How can food affect me? Carbohydrates Carbohydrates affect your blood glucose level more than any other type of food. Your dietitian will help you determine how many carbohydrates to eat at each meal and teach you how to count carbohydrates. Counting carbohydrates is important to keep your blood glucose at a healthy level, especially if you are using insulin or taking certain medicines for diabetes mellitus. Alcohol Alcohol can cause sudden decreases in blood glucose (hypoglycemia), especially if you use insulin or take certain medicines for diabetes mellitus. Hypoglycemia can be a life-threatening condition. Symptoms of hypoglycemia (sleepiness, dizziness, and disorientation) are similar to symptoms of having too much alcohol. If your health care provider has given you approval to drink alcohol, do so in moderation and use the following guidelines:  Women should not have more than one drink per day, and men  should not have more than two drinks per day. One drink is equal to: ? 12 oz of beer. ? 5 oz of wine. ? 1 oz of hard liquor.  Do not drink on an empty stomach.  Keep yourself hydrated. Have water, diet soda, or unsweetened iced tea.  Regular soda, juice, and other mixers might contain a lot of carbohydrates and should be counted.  What foods are not recommended? As you make food choices, it is important to remember that all foods are not the same. Some foods have fewer nutrients per serving than other foods, even though they might have the same number of calories or carbohydrates. It is difficult to get your body what it needs when you eat foods with fewer nutrients. Examples of foods that you should avoid that are high in calories and carbohydrates but low in nutrients include:  Trans fats (most processed foods list trans fats on the Nutrition Facts label).  Regular soda.  Juice.  Candy.  Sweets, such as cake, pie, doughnuts, and cookies.  Fried foods.  What foods can I eat? Eat nutrient-rich foods, which will nourish your body and keep you healthy. The food you should eat also will depend on several factors, including:  The calories you need.  The medicines you take.  Your weight.  Your blood glucose level.  Your blood pressure level.  Your cholesterol level.  You should eat a variety of foods, including:  Protein. ? Lean cuts of meat. ? Proteins low in saturated fats, such as fish, egg whites, and beans. Avoid processed meats.  Fruits and vegetables. ? Fruits and vegetables that may help control blood glucose levels, such as apples,   mangoes, and yams.  Dairy products. ? Choose fat-free or low-fat dairy products, such as milk, yogurt, and cheese.  Grains, bread, pasta, and rice. ? Choose whole grain products, such as multigrain bread, whole oats, and brown rice. These foods may help control blood pressure.  Fats. ? Foods containing healthful fats, such as  nuts, avocado, olive oil, canola oil, and fish.  Does everyone with diabetes mellitus have the same meal plan? Because every person with diabetes mellitus is different, there is not one meal plan that works for everyone. It is very important that you meet with a dietitian who will help you create a meal plan that is just right for you. This information is not intended to replace advice given to you by your health care provider. Make sure you discuss any questions you have with your health care provider. Document Released: 06/03/2005 Document Revised: 02/12/2016 Document Reviewed: 08/03/2013 Elsevier Interactive Patient Education  2017 Elsevier Inc.  

## 2017-03-18 MED FILL — ?LISINOPRIL 10 MG TABLET: 10 | 30 days supply | Qty: 30 | Fill #6

## 2017-03-18 MED FILL — ?METFORMIN HCL 500MG TABLET: 500 | 30 days supply | Qty: 120 | Fill #2

## 2017-04-18 ENCOUNTER — Other Ambulatory Visit: Payer: Self-pay | Admitting: Family Medicine

## 2017-04-18 DIAGNOSIS — E118 Type 2 diabetes mellitus with unspecified complications: Secondary | ICD-10-CM

## 2017-04-18 MED FILL — LISINOPRIL 10 MG TABLET: 10 | 30 days supply | Qty: 30 | Fill #7

## 2017-04-18 MED FILL — ?METFORMIN HCL 500MG TABLET: 500 | 30 days supply | Qty: 120 | Fill #3

## 2017-05-19 ENCOUNTER — Other Ambulatory Visit: Payer: Self-pay | Admitting: Family Medicine

## 2017-05-19 DIAGNOSIS — E118 Type 2 diabetes mellitus with unspecified complications: Secondary | ICD-10-CM

## 2017-05-19 MED FILL — LISINOPRIL 10 MG TABS: 10 | 30 days supply | Qty: 30 | Fill #8

## 2017-05-19 MED FILL — ?METFORMIN HCL 500MG TABLET: 500 | 30 days supply | Qty: 120 | Fill #0

## 2017-06-16 ENCOUNTER — Other Ambulatory Visit: Payer: Self-pay | Admitting: Family Medicine

## 2017-06-16 DIAGNOSIS — E118 Type 2 diabetes mellitus with unspecified complications: Secondary | ICD-10-CM

## 2017-06-16 MED FILL — LISINOPRIL 10 MG TABS: 10 | 30 days supply | Qty: 30 | Fill #9

## 2017-06-20 MED FILL — ?METFORMIN HCL 500MG TABLET: 500 | 30 days supply | Qty: 120 | Fill #0

## 2017-07-22 MED FILL — LISINOPRIL 10 MG TABS: 10 | 30 days supply | Qty: 30 | Fill #10

## 2017-07-22 MED FILL — ?METFORMIN HCL 500MG TABLET: 500 | 30 days supply | Qty: 120 | Fill #1

## 2017-08-23 MED FILL — ?METFORMIN HCL 500MG TABLET: 500 | 30 days supply | Qty: 120 | Fill #2

## 2017-08-23 MED FILL — LISINOPRIL 10 MG TABS: 10 | 30 days supply | Qty: 30 | Fill #11

## 2017-08-30 ENCOUNTER — Ambulatory Visit: Payer: Self-pay

## 2018-01-02 MED FILL — ?METFORMIN HCL 500MG TABLET: 500 | 30 days supply | Qty: 120 | Fill #3

## 2018-02-08 MED FILL — metFORMIN HCL 500 MG TABS: 500 | 15 days supply | Qty: 60 | Fill #0

## 2018-02-10 ENCOUNTER — Ambulatory Visit: Payer: Self-pay | Attending: Family Medicine | Admitting: Family Medicine

## 2018-02-10 ENCOUNTER — Encounter: Payer: Self-pay | Admitting: Family Medicine

## 2018-02-10 VITALS — BP 184/103 | HR 88 | Temp 98.0°F | Wt 219.0 lb

## 2018-02-10 DIAGNOSIS — E669 Obesity, unspecified: Secondary | ICD-10-CM | POA: Insufficient documentation

## 2018-02-10 DIAGNOSIS — Z9114 Patient's other noncompliance with medication regimen: Secondary | ICD-10-CM | POA: Insufficient documentation

## 2018-02-10 DIAGNOSIS — B353 Tinea pedis: Secondary | ICD-10-CM | POA: Insufficient documentation

## 2018-02-10 DIAGNOSIS — I10 Essential (primary) hypertension: Secondary | ICD-10-CM | POA: Insufficient documentation

## 2018-02-10 DIAGNOSIS — Z7984 Long term (current) use of oral hypoglycemic drugs: Secondary | ICD-10-CM | POA: Insufficient documentation

## 2018-02-10 DIAGNOSIS — E118 Type 2 diabetes mellitus with unspecified complications: Secondary | ICD-10-CM | POA: Insufficient documentation

## 2018-02-10 DIAGNOSIS — Z7982 Long term (current) use of aspirin: Secondary | ICD-10-CM | POA: Insufficient documentation

## 2018-02-10 DIAGNOSIS — Z79899 Other long term (current) drug therapy: Secondary | ICD-10-CM | POA: Insufficient documentation

## 2018-02-10 DIAGNOSIS — K219 Gastro-esophageal reflux disease without esophagitis: Secondary | ICD-10-CM | POA: Insufficient documentation

## 2018-02-10 DIAGNOSIS — E7800 Pure hypercholesterolemia, unspecified: Secondary | ICD-10-CM | POA: Insufficient documentation

## 2018-02-10 LAB — GLUCOSE, POCT (MANUAL RESULT ENTRY): POC Glucose: 116 mg/dl — AB (ref 70–99)

## 2018-02-10 MED ORDER — ATORVASTATIN CALCIUM 40 MG PO TABS: 40.0000 mg | ORAL_TABLET | Freq: Every day | ORAL | 6 refills | Status: DC

## 2018-02-10 MED ORDER — LISINOPRIL 10 MG PO TABS: 10.0000 mg | ORAL_TABLET | Freq: Every day | ORAL | 6 refills | Status: DC

## 2018-02-10 MED ORDER — TERBINAFINE HCL 1 % EX CREA: 1.0000 "application " | TOPICAL_CREAM | Freq: Two times a day (BID) | CUTANEOUS | 0 refills | Status: DC

## 2018-02-10 MED ORDER — METFORMIN HCL 500 MG PO TABS: 1000.0000 mg | ORAL_TABLET | Freq: Two times a day (BID) | ORAL | 6 refills | Status: DC

## 2018-02-10 MED FILL — ATORVASTATIN CALCIUM 40 MG: 40 | 30 days supply | Qty: 30 | Fill #0

## 2018-02-10 MED FILL — LISINOPRIL 10 MG TABS: 10 | 30 days supply | Qty: 30 | Fill #0

## 2018-02-10 NOTE — Progress Notes (Signed)
Subjective:  Patient ID: Jonathan Miller, male    DOB: 01-17-1956  Age: 62 y.o. MRN: [id number]  CC: Diabetes   HPI Jonathan Miller is a 62 year old male with history of type 2 diabetes mellitus (A1c 6.7 ), hypertension,, tobacco abuse, obesity who presents to the clinic for follow-up visit. He was last seen in the clinic in 02/2017 as he states he had a job out of town and just relocated back to [place] and has been out of his medications for sometime. His blood pressure is elevated due to the above reason. He has not been checking his blood sugars either but denies hypoglycemic episodes, numbness in extremities or visual concerns. He has no acute concerns today  Past Medical History:  Diagnosis Date  . Arthritis    "right shoulder" (02/09/2016)  . Bell's palsy 01/2012; 02/09/2016  . Daily headache    "recently" (02/09/2016)  . GERD (gastroesophageal reflux disease)   . History of hiatal hernia   . HTN (hypertension)   . Obesity   . Tobacco abuse   . Type II diabetes mellitus ([place])     Past Surgical History:  Procedure Laterality Date  . NO PAST SURGERIES      No Known Allergies    Outpatient Medications Prior to Visit  Medication Sig Dispense Refill  . aspirin EC 81 MG tablet Take 81 mg by mouth daily.    . metFORMIN (GLUCOPHAGE) 500 MG tablet TAKE 2 TABLET BY MOUTH 2 TIMES DAILY WITH A MEAL. 120 tablet 0  . aspirin 325 MG tablet Take 1 tablet (325 mg total) by mouth daily. (Patient not taking: Reported on 11/30/2016) 30 tablet 0  . atorvastatin (LIPITOR) 40 MG tablet Take 1 tablet (40 mg total) by mouth daily at 6 PM. (Patient not taking: Reported on 02/10/2018) 30 tablet 3  . lisinopril (PRINIVIL) 10 MG tablet Take 1 tablet (10 mg total) by mouth daily. (Patient not taking: Reported on 02/10/2018) 30 tablet 3   No facility-administered medications prior to visit.     ROS Review of Systems  Constitutional: Negative for activity change and appetite change.  HENT: Negative  for sinus pressure and sore throat.   Eyes: Negative for visual disturbance.  Respiratory: Negative for cough, chest tightness and shortness of breath.   Cardiovascular: Negative for chest pain and leg swelling.  Gastrointestinal: Negative for abdominal distention, abdominal pain, constipation and diarrhea.  Endocrine: Negative.   Genitourinary: Negative for dysuria.  Musculoskeletal: Negative for joint swelling and myalgias.  Skin: Negative for rash.  Allergic/Immunologic: Negative.   Neurological: Negative for weakness, light-headedness and numbness.  Psychiatric/Behavioral: Negative for dysphoric mood and suicidal ideas.    Objective:  BP (!) 184/103   Pulse 88   Temp 98 F (36.7 C) (Oral)   Wt 219 lb (99.3 kg)   SpO2 93%   BMI 36.44 kg/m   BP/Weight 02/10/2018 03/02/2017 09/21/5850  Systolic BP 778 242 353  Diastolic BP 614 70 80  Wt. (Lbs) 219 204.4 209.2  BMI 36.44 34.01 34.81      Physical Exam  Constitutional: He is oriented to person, place, and time. He appears well-developed and well-nourished.  Cardiovascular: Normal rate, normal heart sounds and intact distal pulses.  No murmur heard. Pulmonary/Chest: Effort normal and breath sounds normal. He has no wheezes. He has no rales. He exhibits no tenderness.  Abdominal: Soft. Bowel sounds are normal. He exhibits no distension and no mass. There is no tenderness.  Musculoskeletal: Normal range  of motion.  Neurological: He is alert and oriented to person, place, and time.  Skin:  Tinea pedis in web spaces of toes  Psychiatric: He has a normal mood and affect.   CMP Latest Ref Rng & Units 12/06/2016 09/01/2016 02/08/2016  Glucose 65 - 99 mg/dL 130(H) 205(H) 234(H)  BUN 7 - 25 mg/dL 28(H) 21 18  Creatinine 0.70 - 1.25 mg/dL 0.93 0.98 0.70  Sodium 135 - 146 mmol/L 136 135 139  Potassium 3.5 - 5.3 mmol/L 5.0 5.1 4.0  Chloride 98 - 110 mmol/L 105 101 101  CO2 20 - 31 mmol/L 25 27 -  Calcium 8.6 - 10.3 mg/dL 9.7 9.5 -    Total Protein 6.1 - 8.1 g/dL 6.4 6.7 -  Total Bilirubin 0.2 - 1.2 mg/dL 0.5 0.5 -  Alkaline Phos 40 - 115 U/L 56 56 -  AST 10 - 35 U/L 15 16 -  ALT 9 - 46 U/L 17 17 -    Lab Results  Component Value Date   HGBA1C 6.7 03/02/2017     Assessment & Plan:   1. Diabetes mellitus with complication (HCC) W9T of 6.7 Anticipate elevated but will make no regimen changes due to the fact that he has been out of his medications Diabetic diet. - POCT glucose (manual entry) - Hemoglobin A1c - metFORMIN (GLUCOPHAGE) 500 MG tablet; Take 2 tablets (1,000 mg total) by mouth 2 (two) times daily with a meal.  Dispense: 120 tablet; Refill: 6  2. Pure hypercholesterolemia Stable Low cholesterol diet - atorvastatin (LIPITOR) 40 MG tablet; Take 1 tablet (40 mg total) by mouth daily at 6 PM.  Dispense: 30 tablet; Refill: 6  3. Essential hypertension Uncontrolled due to running out of medications Unable to administer Clonidine due to sedating side effect as he will be driving back - lisinopril (PRINIVIL) 10 MG tablet; Take 1 tablet (10 mg total) by mouth daily.  Dispense: 30 tablet; Refill: 6 - CMP14+EGFR  4. Tinea pedis of both feet - terbinafine (LAMISIL AT) 1 % cream; Apply 1 application topically 2 (two) times daily.  Dispense: 30 g; Refill: 0   Meds ordered this encounter  Medications  . atorvastatin (LIPITOR) 40 MG tablet    Sig: Take 1 tablet (40 mg total) by mouth daily at 6 PM.    Dispense:  30 tablet    Refill:  6  . lisinopril (PRINIVIL) 10 MG tablet    Sig: Take 1 tablet (10 mg total) by mouth daily.    Dispense:  30 tablet    Refill:  6  . metFORMIN (GLUCOPHAGE) 500 MG tablet    Sig: Take 2 tablets (1,000 mg total) by mouth 2 (two) times daily with a meal.    Dispense:  120 tablet    Refill:  6  . terbinafine (LAMISIL AT) 1 % cream    Sig: Apply 1 application topically 2 (two) times daily.    Dispense:  30 g    Refill:  0    Follow-up: Return in about 3 months (around  05/13/2018) for Follow-up of diabetes and hypertension.   Charlott Rakes MD

## 2018-02-10 NOTE — Patient Instructions (Signed)
Diabetes Mellitus and Nutrition When you have diabetes (diabetes mellitus), it is very important to have healthy eating habits because your blood sugar (glucose) levels are greatly affected by what you eat and drink. Eating healthy foods in the appropriate amounts, at about the same times every day, can help you:  Control your blood glucose.  Lower your risk of heart disease.  Improve your blood pressure.  Reach or maintain a healthy weight.  Every person with diabetes is different, and each person has different needs for a meal plan. Your health care provider may recommend that you work with a diet and nutrition specialist (dietitian) to make a meal plan that is best for you. Your meal plan may vary depending on factors such as:  The calories you need.  The medicines you take.  Your weight.  Your blood glucose, blood pressure, and cholesterol levels.  Your activity level.  Other health conditions you have, such as heart or kidney disease.  How do carbohydrates affect me? Carbohydrates affect your blood glucose level more than any other type of food. Eating carbohydrates naturally increases the amount of glucose in your blood. Carbohydrate counting is a method for keeping track of how many carbohydrates you eat. Counting carbohydrates is important to keep your blood glucose at a healthy level, especially if you use insulin or take certain oral diabetes medicines. It is important to know how many carbohydrates you can safely have in each meal. This is different for every person. Your dietitian can help you calculate how many carbohydrates you should have at each meal and for snack. Foods that contain carbohydrates include:  Bread, cereal, rice, pasta, and crackers.  Potatoes and corn.  Peas, beans, and lentils.  Milk and yogurt.  Fruit and juice.  Desserts, such as cakes, cookies, ice cream, and candy.  How does alcohol affect me? Alcohol can cause a sudden decrease in blood  glucose (hypoglycemia), especially if you use insulin or take certain oral diabetes medicines. Hypoglycemia can be a life-threatening condition. Symptoms of hypoglycemia (sleepiness, dizziness, and confusion) are similar to symptoms of having too much alcohol. If your health care provider says that alcohol is safe for you, follow these guidelines:  Limit alcohol intake to no more than 1 drink per day for nonpregnant women and 2 drinks per day for men. One drink equals 12 oz of beer, 5 oz of wine, or 1 oz of hard liquor.  Do not drink on an empty stomach.  Keep yourself hydrated with water, diet soda, or unsweetened iced tea.  Keep in mind that regular soda, juice, and other mixers may contain a lot of sugar and must be counted as carbohydrates.  What are tips for following this plan? Reading food labels  Start by checking the serving size on the label. The amount of calories, carbohydrates, fats, and other nutrients listed on the label are based on one serving of the food. Many foods contain more than one serving per package.  Check the total grams (g) of carbohydrates in one serving. You can calculate the number of servings of carbohydrates in one serving by dividing the total carbohydrates by 15. For example, if a food has 30 g of total carbohydrates, it would be equal to 2 servings of carbohydrates.  Check the number of grams (g) of saturated and trans fats in one serving. Choose foods that have low or no amount of these fats.  Check the number of milligrams (mg) of sodium in one serving. Most people   should limit total sodium intake to less than 2,300 mg per day.  Always check the nutrition information of foods labeled as "low-fat" or "nonfat". These foods may be higher in added sugar or refined carbohydrates and should be avoided.  Talk to your dietitian to identify your daily goals for nutrients listed on the label. Shopping  Avoid buying canned, premade, or processed foods. These  foods tend to be high in fat, sodium, and added sugar.  Shop around the outside edge of the grocery store. This includes fresh fruits and vegetables, bulk grains, fresh meats, and fresh dairy. Cooking  Use low-heat cooking methods, such as baking, instead of high-heat cooking methods like deep frying.  Cook using healthy oils, such as olive, canola, or sunflower oil.  Avoid cooking with butter, cream, or high-fat meats. Meal planning  Eat meals and snacks regularly, preferably at the same times every day. Avoid going long periods of time without eating.  Eat foods high in fiber, such as fresh fruits, vegetables, beans, and whole grains. Talk to your dietitian about how many servings of carbohydrates you can eat at each meal.  Eat 4-6 ounces of lean protein each day, such as lean meat, chicken, fish, eggs, or tofu. 1 ounce is equal to 1 ounce of meat, chicken, or fish, 1 egg, or 1/4 cup of tofu.  Eat some foods each day that contain healthy fats, such as avocado, nuts, seeds, and fish. Lifestyle   Check your blood glucose regularly.  Exercise at least 30 minutes 5 or more days each week, or as told by your health care provider.  Take medicines as told by your health care provider.  Do not use any products that contain nicotine or tobacco, such as cigarettes and e-cigarettes. If you need help quitting, ask your health care provider.  Work with a counselor or diabetes educator to identify strategies to manage stress and any emotional and social challenges. What are some questions to ask my health care provider?  Do I need to meet with a diabetes educator?  Do I need to meet with a dietitian?  What number can I call if I have questions?  When are the best times to check my blood glucose? Where to find more information:  American Diabetes Association: diabetes.org/food-and-fitness/food  Academy of Nutrition and Dietetics:  www.eatright.org/resources/health/diseases-and-conditions/diabetes  National Institute of Diabetes and Digestive and Kidney Diseases (NIH): www.niddk.nih.gov/health-information/diabetes/overview/diet-eating-physical-activity Summary  A healthy meal plan will help you control your blood glucose and maintain a healthy lifestyle.  Working with a diet and nutrition specialist (dietitian) can help you make a meal plan that is best for you.  Keep in mind that carbohydrates and alcohol have immediate effects on your blood glucose levels. It is important to count carbohydrates and to use alcohol carefully. This information is not intended to replace advice given to you by your health care provider. Make sure you discuss any questions you have with your health care provider. Document Released: 06/03/2005 Document Revised: 10/11/2016 Document Reviewed: 10/11/2016 Elsevier Interactive Patient Education  2018 [employer] Inc.  

## 2018-02-11 LAB — CMP14+EGFR
A/G RATIO: 1.6 (ref 1.2–2.2)
ALT: 18 IU/L (ref 0–44)
AST: 17 IU/L (ref 0–40)
Albumin: 3.9 g/dL (ref 3.6–4.8)
Alkaline Phosphatase: 65 IU/L (ref 39–117)
BUN/Creatinine Ratio: 23 (ref 10–24)
BUN: 20 mg/dL (ref 8–27)
Bilirubin Total: 0.4 mg/dL (ref 0.0–1.2)
CALCIUM: 9.5 mg/dL (ref 8.6–10.2)
CO2: 22 mmol/L (ref 20–29)
CREATININE: 0.87 mg/dL (ref 0.76–1.27)
Chloride: 103 mmol/L (ref 96–106)
GFR, EST AFRICAN AMERICAN: 107 mL/min/{1.73_m2} (ref >59)
GFR, EST NON AFRICAN AMERICAN: 92 mL/min/{1.73_m2} (ref >59)
GLOBULIN, TOTAL: 2.4 g/dL (ref 1.5–4.5)
Glucose: 110 mg/dL — ABNORMAL HIGH (ref 65–99)
Potassium: 4.4 mmol/L (ref 3.5–5.2)
SODIUM: 140 mmol/L (ref 134–144)
TOTAL PROTEIN: 6.3 g/dL (ref 6.0–8.5)

## 2018-02-11 LAB — HEMOGLOBIN A1C
Est. average glucose Bld gHb Est-mCnc: 206 mg/dL
HEMOGLOBIN A1C: 8.8 % — AB (ref 4.8–5.6)

## 2018-02-16 ENCOUNTER — Telehealth: Payer: Self-pay

## 2018-02-16 NOTE — Telephone Encounter (Signed)
Patient was called and informed of lab results. 

## 2018-02-27 MED FILL — metFORMIN HCL 500 MG TABS: 500 | 15 days supply | Qty: 60 | Fill #1

## 2018-03-18 IMAGING — CT CT HEAD W/O CM
4 series · 16 of 47 positions shown, 18 images · non-contrast
Comparison: MRI/ MRA of the brain performed 01/22/2012, and CT of
the head performed 01/21/2012

CLINICAL DATA: Acute onset of right facial numbness at the lips.
Initial encounter.

EXAM:
CT HEAD WITHOUT CONTRAST
TECHNIQUE: Contiguous axial images were obtained from the base of the skull
through the vertex without intravenous contrast.

[Series 2: head without · axial · non-contrast · 0.42mm/px · z∈[-95,+25]mm · 7 of 34 slices shown, 9 images]
[im 5/34  brain]
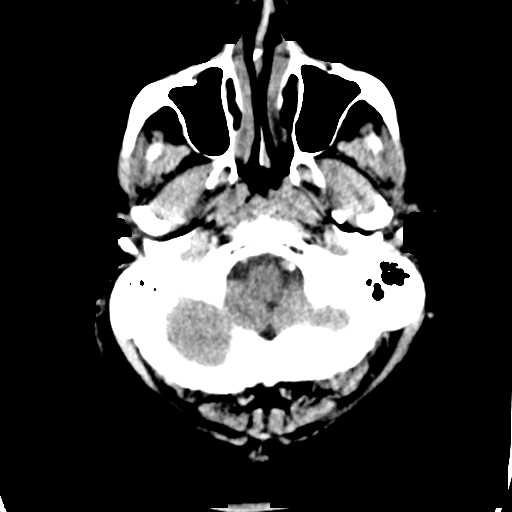
[im 5/34  bone]
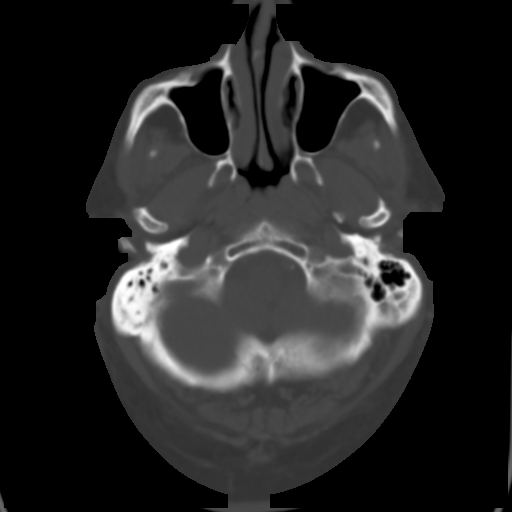
[im 9/34  brain]
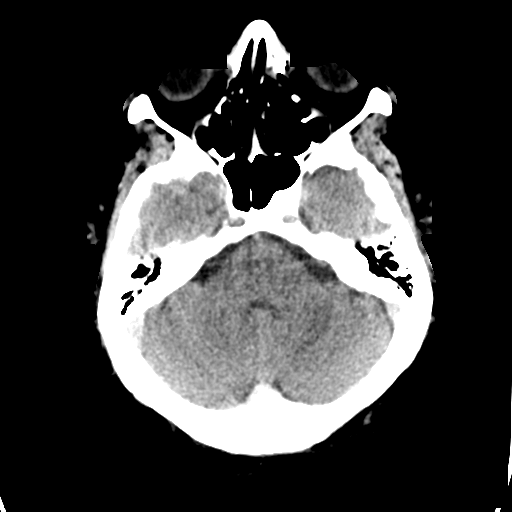
[im 13/34  brain]
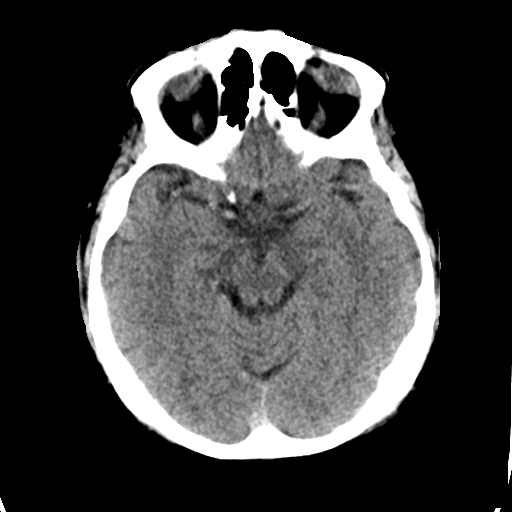
[im 17/34  brain]
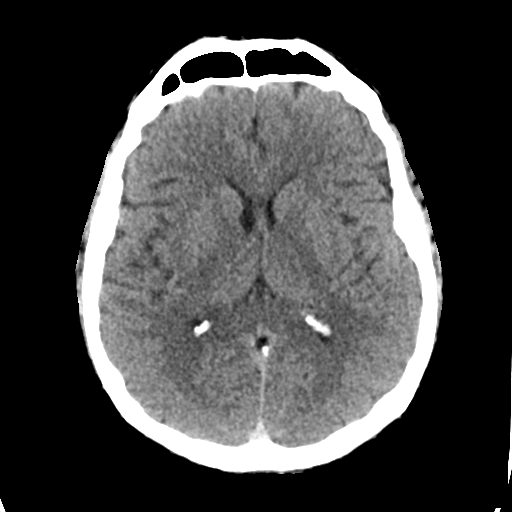
[im 21/34  brain]
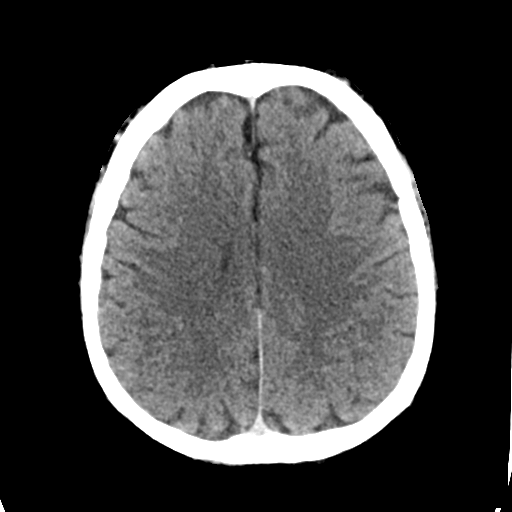
[im 21/34  bone]
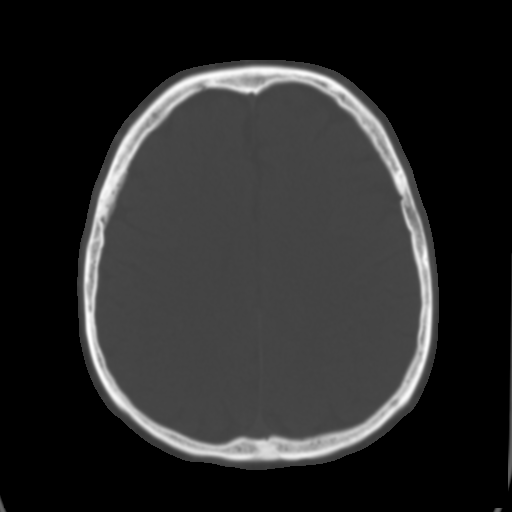
[im 25/34  brain]
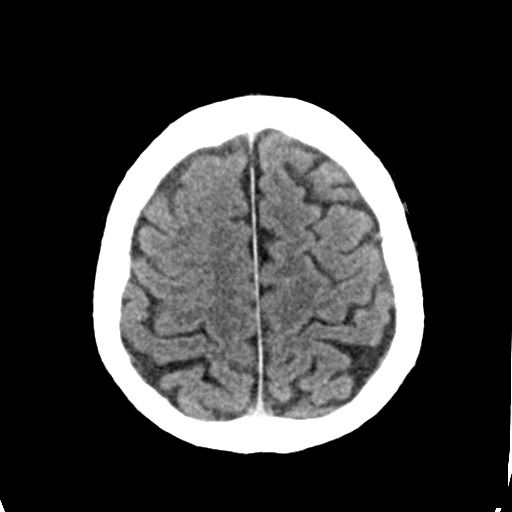
[im 29/34  brain]
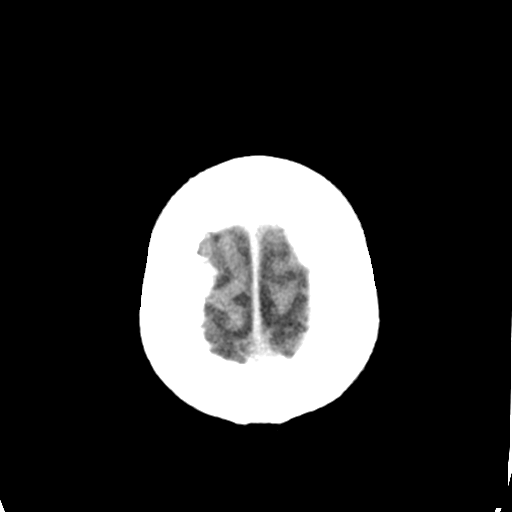

[Series 3: head bone · axial · 0.42mm/px · z∈[-99,-67]mm · 3 of 84 slices shown]
[im 9/84  bone]
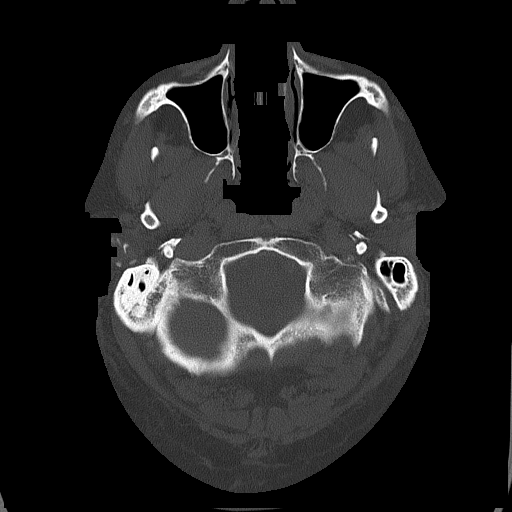
[im 17/84  bone]
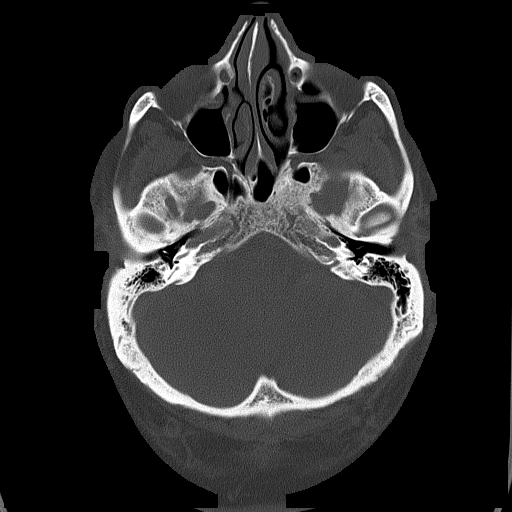
[im 25/84  bone]
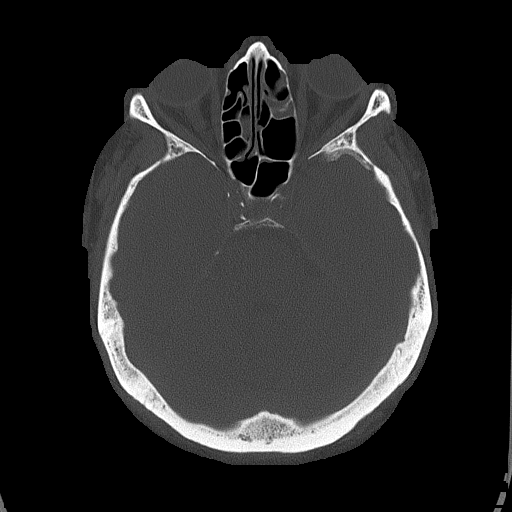

[Series 4: head without cor · coronal · non-contrast · 0.34mm/px · 3 of 69 slices shown]
[im 23/69  brain]
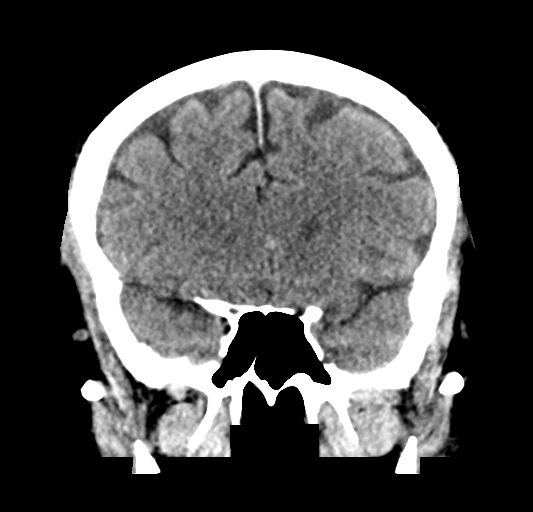
[im 31/69  brain]
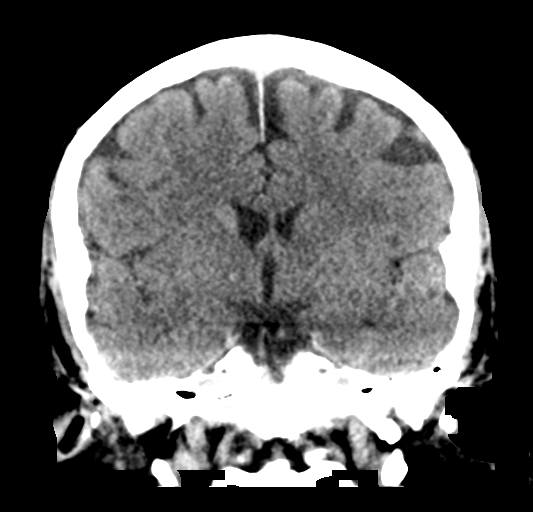
[im 38/69  brain]
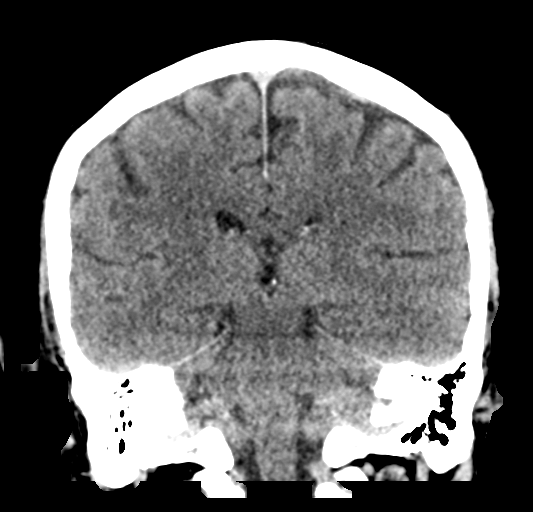

[Series 5: head without sag · sagittal · non-contrast · 0.34mm/px · 3 of 67 slices shown]
[im 23/67  brain]
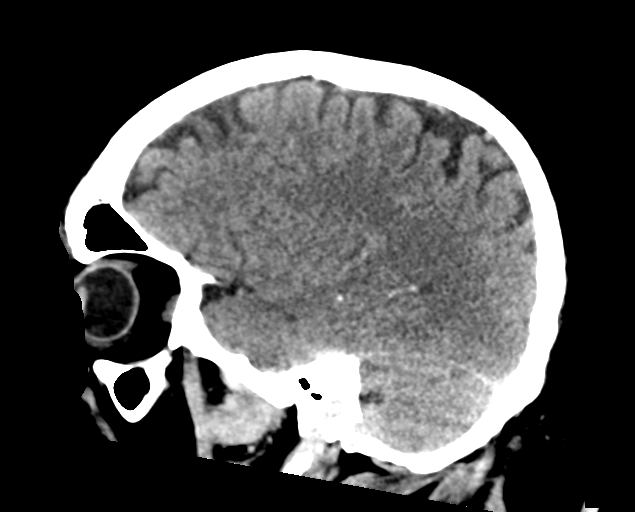
[im 34/67  brain]
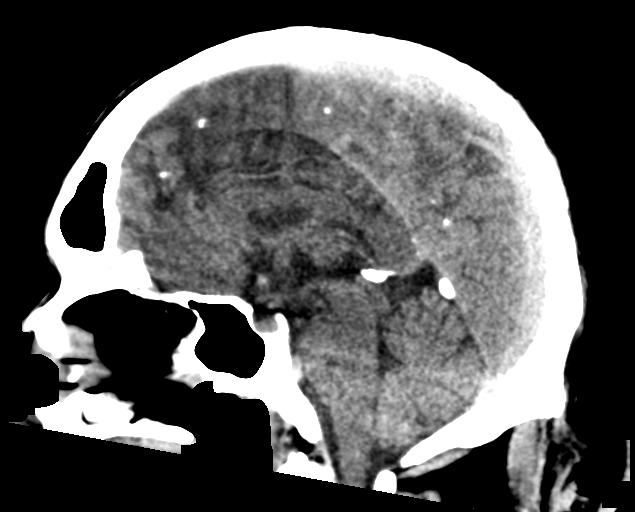
[im 45/67  brain]
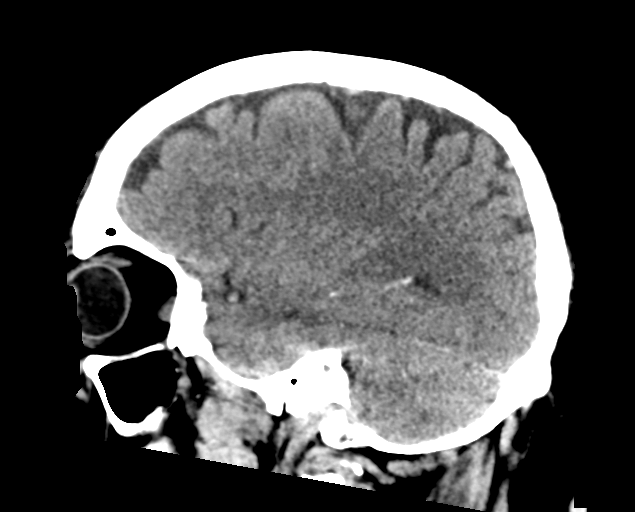

[16 of 47 positions shown; findings below may reference images not displayed]

FINDINGS: There is no evidence of acute infarction, mass lesion, or intra- or
extra-axial hemorrhage on CT.

The posterior fossa, including the cerebellum, brainstem and fourth
ventricle, is within normal limits. The third and lateral
ventricles, and basal ganglia are unremarkable in appearance. The
cerebral hemispheres are symmetric in appearance, with normal
gray-white differentiation. No mass effect or midline shift is seen.

There is no evidence of fracture; visualized osseous structures are
unremarkable in appearance. The orbits are within normal limits. The
paranasal sinuses and mastoid air cells are well-aerated. No
significant soft tissue abnormalities are seen.
IMPRESSION: Unremarkable noncontrast CT of the head.

## 2018-03-21 MED FILL — metFORMIN HCL 500 MG TABS: 500 | 30 days supply | Qty: 120 | Fill #0

## 2018-05-01 MED FILL — metFORMIN HCL 500 MG TABS: 500 | 30 days supply | Qty: 120 | Fill #1

## 2018-06-23 MED FILL — metFORMIN HCL 500 MG TABS: 500 | 30 days supply | Qty: 120 | Fill #2

## 2018-09-08 MED FILL — metFORMIN HCL 500 MG TABS: 500 | 30 days supply | Qty: 120 | Fill #3

## 2018-11-24 MED FILL — metFORMIN HCL 500 MG TABS: 500 | 30 days supply | Qty: 120 | Fill #4

## 2019-02-05 MED FILL — metFORMIN HCL 500 MG TABS: 500 | 30 days supply | Qty: 120 | Fill #5

## 2019-05-07 ENCOUNTER — Other Ambulatory Visit: Payer: Self-pay | Admitting: Family Medicine

## 2019-05-07 DIAGNOSIS — E118 Type 2 diabetes mellitus with unspecified complications: Secondary | ICD-10-CM

## 2020-05-06 ENCOUNTER — Other Ambulatory Visit: Payer: Self-pay

## 2020-05-06 ENCOUNTER — Encounter: Payer: Self-pay | Admitting: Cardiology

## 2020-05-06 ENCOUNTER — Other Ambulatory Visit (HOSPITAL_COMMUNITY): Payer: Self-pay

## 2020-05-06 ENCOUNTER — Encounter (HOSPITAL_COMMUNITY)
Admission: EM | Disposition: A | Discharge status: Home / Self Care | Payer: Self-pay | Source: Home / Self Care | Attending: Cardiovascular Disease

## 2020-05-06 ENCOUNTER — Encounter (HOSPITAL_COMMUNITY): Payer: Self-pay | Admitting: Emergency Medicine

## 2020-05-06 ENCOUNTER — Inpatient Hospital Stay (HOSPITAL_COMMUNITY)
Admission: EM | Admit: 2020-05-06 | Discharge: 2020-05-07 | DRG: 246 | Disposition: A | Discharge status: Home / Self Care | Payer: Self-pay | Attending: Cardiovascular Disease | Admitting: Cardiovascular Disease

## 2020-05-06 ENCOUNTER — Inpatient Hospital Stay (HOSPITAL_COMMUNITY): Payer: Self-pay

## 2020-05-06 ENCOUNTER — Emergency Department (HOSPITAL_COMMUNITY): Payer: Self-pay

## 2020-05-06 DIAGNOSIS — I10 Essential (primary) hypertension: Secondary | ICD-10-CM

## 2020-05-06 DIAGNOSIS — E1165 Type 2 diabetes mellitus with hyperglycemia: Secondary | ICD-10-CM | POA: Diagnosis present

## 2020-05-06 DIAGNOSIS — I2510 Atherosclerotic heart disease of native coronary artery without angina pectoris: Secondary | ICD-10-CM | POA: Diagnosis present

## 2020-05-06 DIAGNOSIS — R001 Bradycardia, unspecified: Secondary | ICD-10-CM | POA: Diagnosis present

## 2020-05-06 DIAGNOSIS — I739 Peripheral vascular disease, unspecified: Secondary | ICD-10-CM

## 2020-05-06 DIAGNOSIS — E785 Hyperlipidemia, unspecified: Secondary | ICD-10-CM

## 2020-05-06 DIAGNOSIS — E119 Type 2 diabetes mellitus without complications: Secondary | ICD-10-CM

## 2020-05-06 DIAGNOSIS — M19011 Primary osteoarthritis, right shoulder: Secondary | ICD-10-CM | POA: Diagnosis present

## 2020-05-06 DIAGNOSIS — I255 Ischemic cardiomyopathy: Secondary | ICD-10-CM

## 2020-05-06 DIAGNOSIS — IMO0002 Reserved for concepts with insufficient information to code with codable children: Secondary | ICD-10-CM

## 2020-05-06 DIAGNOSIS — E669 Obesity, unspecified: Secondary | ICD-10-CM | POA: Diagnosis present

## 2020-05-06 DIAGNOSIS — Z20822 Contact with and (suspected) exposure to covid-19: Secondary | ICD-10-CM | POA: Diagnosis present

## 2020-05-06 DIAGNOSIS — E1151 Type 2 diabetes mellitus with diabetic peripheral angiopathy without gangrene: Secondary | ICD-10-CM | POA: Diagnosis present

## 2020-05-06 DIAGNOSIS — Z720 Tobacco use: Secondary | ICD-10-CM

## 2020-05-06 DIAGNOSIS — I213 ST elevation (STEMI) myocardial infarction of unspecified site: Secondary | ICD-10-CM

## 2020-05-06 DIAGNOSIS — F17210 Nicotine dependence, cigarettes, uncomplicated: Secondary | ICD-10-CM | POA: Diagnosis present

## 2020-05-06 DIAGNOSIS — K219 Gastro-esophageal reflux disease without esophagitis: Secondary | ICD-10-CM | POA: Diagnosis present

## 2020-05-06 DIAGNOSIS — Z23 Encounter for immunization: Secondary | ICD-10-CM

## 2020-05-06 DIAGNOSIS — N179 Acute kidney failure, unspecified: Secondary | ICD-10-CM

## 2020-05-06 DIAGNOSIS — E1169 Type 2 diabetes mellitus with other specified complication: Secondary | ICD-10-CM

## 2020-05-06 DIAGNOSIS — I745 Embolism and thrombosis of iliac artery: Secondary | ICD-10-CM | POA: Diagnosis present

## 2020-05-06 DIAGNOSIS — I152 Hypertension secondary to endocrine disorders: Secondary | ICD-10-CM

## 2020-05-06 DIAGNOSIS — I110 Hypertensive heart disease with heart failure: Secondary | ICD-10-CM | POA: Diagnosis present

## 2020-05-06 DIAGNOSIS — Z6833 Body mass index (BMI) 33.0-33.9, adult: Secondary | ICD-10-CM

## 2020-05-06 DIAGNOSIS — I5041 Acute combined systolic (congestive) and diastolic (congestive) heart failure: Secondary | ICD-10-CM | POA: Insufficient documentation

## 2020-05-06 DIAGNOSIS — R079 Chest pain, unspecified: Secondary | ICD-10-CM

## 2020-05-06 DIAGNOSIS — Z955 Presence of coronary angioplasty implant and graft: Secondary | ICD-10-CM

## 2020-05-06 DIAGNOSIS — I2111 ST elevation (STEMI) myocardial infarction involving right coronary artery: Principal | ICD-10-CM | POA: Diagnosis present

## 2020-05-06 HISTORY — PX: CORONARY/GRAFT ACUTE MI REVASCULARIZATION: CATH118305

## 2020-05-06 HISTORY — PX: LEFT HEART CATH AND CORONARY ANGIOGRAPHY: CATH118249

## 2020-05-06 HISTORY — DX: ST elevation (STEMI) myocardial infarction of unspecified site: I21.3

## 2020-05-06 HISTORY — DX: Disorder of kidney and ureter, unspecified: N28.9

## 2020-05-06 HISTORY — DX: Hyperlipidemia, unspecified: E78.5

## 2020-05-06 HISTORY — DX: Type 2 diabetes mellitus without complications: E11.9

## 2020-05-06 HISTORY — DX: Tobacco use: Z72.0

## 2020-05-06 HISTORY — DX: Peripheral vascular disease, unspecified: I73.9

## 2020-05-06 HISTORY — DX: Atherosclerotic heart disease of native coronary artery without angina pectoris: I25.10

## 2020-05-06 HISTORY — DX: Diaphragmatic hernia without obstruction or gangrene: K44.9

## 2020-05-06 HISTORY — DX: Ischemic cardiomyopathy: I25.5

## 2020-05-06 HISTORY — DX: Essential (primary) hypertension: I10

## 2020-05-06 LAB — COMPREHENSIVE METABOLIC PANEL WITH GFR
ALT: 24 U/L (ref 0–44)
AST: 22 U/L (ref 15–41)
Albumin: 2.9 g/dL — ABNORMAL LOW (ref 3.5–5.0)
Alkaline Phosphatase: 56 U/L (ref 38–126)
Anion gap: 10 (ref 5–15)
BUN: 21 mg/dL (ref 8–23)
CO2: 22 mmol/L (ref 22–32)
Calcium: 8.9 mg/dL (ref 8.9–10.3)
Chloride: 102 mmol/L (ref 98–111)
Creatinine, Ser: 1.37 mg/dL — ABNORMAL HIGH (ref 0.61–1.24)
GFR calc Af Amer: >60 mL/min
GFR calc non Af Amer: 54 mL/min — ABNORMAL LOW
Glucose, Bld: 326 mg/dL — ABNORMAL HIGH (ref 70–99)
Potassium: 4.5 mmol/L (ref 3.5–5.1)
Sodium: 134 mmol/L — ABNORMAL LOW (ref 135–145)
Total Bilirubin: 0.9 mg/dL (ref 0.3–1.2)
Total Protein: 5.5 g/dL — ABNORMAL LOW (ref 6.5–8.1)

## 2020-05-06 LAB — CBC WITH DIFFERENTIAL/PLATELET
Abs Immature Granulocytes: 0.11 10*3/uL — ABNORMAL HIGH (ref 0.00–0.07)
Basophils Absolute: 0.1 10*3/uL (ref 0.0–0.1)
Basophils Relative: 1 %
Eosinophils Absolute: 0.2 10*3/uL (ref 0.0–0.5)
Eosinophils Relative: 2 %
HCT: 46.1 % (ref 39.0–52.0)
Hemoglobin: 14.8 g/dL (ref 13.0–17.0)
Immature Granulocytes: 1 %
Lymphocytes Relative: 13 %
Lymphs Abs: 1.5 10*3/uL (ref 0.7–4.0)
MCH: 30.6 pg (ref 26.0–34.0)
MCHC: 32.1 g/dL (ref 30.0–36.0)
MCV: 95.2 fL (ref 80.0–100.0)
Monocytes Absolute: 0.9 10*3/uL (ref 0.1–1.0)
Monocytes Relative: 7 %
Neutro Abs: 8.9 10*3/uL — ABNORMAL HIGH (ref 1.7–7.7)
Neutrophils Relative %: 76 %
Platelets: 257 10*3/uL (ref 150–400)
RBC: 4.84 MIL/uL (ref 4.22–5.81)
RDW: 12.5 % (ref 11.5–15.5)
WBC: 11.7 10*3/uL — ABNORMAL HIGH (ref 4.0–10.5)
nRBC: 0 % (ref 0.0–0.2)

## 2020-05-06 LAB — BASIC METABOLIC PANEL
Anion gap: 12 (ref 5–15)
BUN: 20 mg/dL (ref 8–23)
CO2: 23 mmol/L (ref 22–32)
Calcium: 9.4 mg/dL (ref 8.9–10.3)
Chloride: 97 mmol/L — ABNORMAL LOW (ref 98–111)
Creatinine, Ser: 1.21 mg/dL (ref 0.61–1.24)
GFR calc Af Amer: >60 mL/min (ref >60)
GFR calc non Af Amer: >60 mL/min (ref >60)
Glucose, Bld: 357 mg/dL — ABNORMAL HIGH (ref 70–99)
Potassium: 4.3 mmol/L (ref 3.5–5.1)
Sodium: 132 mmol/L — ABNORMAL LOW (ref 135–145)

## 2020-05-06 LAB — TROPONIN I (HIGH SENSITIVITY)
Troponin I (High Sensitivity): 12 ng/L
Troponin I (High Sensitivity): >27000 ng/L (ref <18)

## 2020-05-06 LAB — ECHOCARDIOGRAM COMPLETE
Area-P 1/2: 2.42 cm2
Height: 65 in
S' Lateral: 3.1 cm
Weight: 3319.25 oz

## 2020-05-06 LAB — CBC
HCT: 48.1 % (ref 39.0–52.0)
Hemoglobin: 16 g/dL (ref 13.0–17.0)
MCH: 30.6 pg (ref 26.0–34.0)
MCHC: 33.3 g/dL (ref 30.0–36.0)
MCV: 92 fL (ref 80.0–100.0)
Platelets: 289 10*3/uL (ref 150–400)
RBC: 5.23 MIL/uL (ref 4.22–5.81)
RDW: 12.7 % (ref 11.5–15.5)
WBC: 12 10*3/uL — ABNORMAL HIGH (ref 4.0–10.5)
nRBC: 0 % (ref 0.0–0.2)

## 2020-05-06 LAB — LIPID PANEL
Cholesterol: 229 mg/dL — ABNORMAL HIGH (ref 0–200)
HDL: 48 mg/dL
LDL Cholesterol: 121 mg/dL — ABNORMAL HIGH (ref 0–99)
Total CHOL/HDL Ratio: 4.8 ratio
Triglycerides: 298 mg/dL — ABNORMAL HIGH
VLDL: 60 mg/dL — ABNORMAL HIGH (ref 0–40)

## 2020-05-06 LAB — APTT: aPTT: 58 seconds — ABNORMAL HIGH (ref 24–36)

## 2020-05-06 LAB — POCT ACTIVATED CLOTTING TIME
Activated Clotting Time: 246 seconds
Activated Clotting Time: 307 seconds
Activated Clotting Time: 175 seconds
Activated Clotting Time: 131 seconds

## 2020-05-06 LAB — GLUCOSE, CAPILLARY
Glucose-Capillary: 311 mg/dL — ABNORMAL HIGH (ref 70–99)
Glucose-Capillary: 347 mg/dL — ABNORMAL HIGH (ref 70–99)
Glucose-Capillary: 210 mg/dL — ABNORMAL HIGH (ref 70–99)
Glucose-Capillary: 207 mg/dL — ABNORMAL HIGH (ref 70–99)
Glucose-Capillary: 161 mg/dL — ABNORMAL HIGH (ref 70–99)

## 2020-05-06 LAB — MRSA PCR SCREENING: MRSA by PCR: NEGATIVE

## 2020-05-06 LAB — SARS CORONAVIRUS 2 BY RT PCR (HOSPITAL ORDER, PERFORMED IN ~~LOC~~ HOSPITAL LAB): SARS Coronavirus 2: NEGATIVE

## 2020-05-06 LAB — PROTIME-INR
INR: 1 (ref 0.8–1.2)
Prothrombin Time: 13 s (ref 11.4–15.2)

## 2020-05-06 LAB — HEMOGLOBIN A1C
Hgb A1c MFr Bld: 11.8 % — ABNORMAL HIGH (ref 4.8–5.6)
Mean Plasma Glucose: 291.96 mg/dL

## 2020-05-06 SURGERY — CORONARY/GRAFT ACUTE MI REVASCULARIZATION
Anesthesia: LOCAL

## 2020-05-06 MED ORDER — HEPARIN SODIUM (PORCINE) 5000 UNIT/ML IJ SOLN
5000.0000 [IU] | Freq: Three times a day (TID) | INTRAMUSCULAR | Status: DC
  Administered 2020-05-06 – 2020-05-07 (×3): 5000 [IU] via SUBCUTANEOUS
  Filled 2020-05-06 (×3): qty 1

## 2020-05-06 MED ORDER — FUROSEMIDE 10 MG/ML IJ SOLN
INTRAMUSCULAR | Status: DC | PRN
  Administered 2020-05-06: 40 mg via INTRAVENOUS

## 2020-05-06 MED ORDER — ASPIRIN 81 MG PO CHEW: 324.0000 mg | CHEWABLE_TABLET | Freq: Once | ORAL | Status: DC

## 2020-05-06 MED ORDER — METOPROLOL TARTRATE 25 MG PO TABS
25.0000 mg | ORAL_TABLET | Freq: Two times a day (BID) | ORAL | Status: DC
  Administered 2020-05-06 – 2020-05-07 (×3): 25 mg via ORAL
  Filled 2020-05-06 (×3): qty 1

## 2020-05-06 MED ORDER — HEPARIN SODIUM (PORCINE) 1000 UNIT/ML IJ SOLN
INTRAMUSCULAR | Status: DC | PRN
  Administered 2020-05-06: 4000 [IU] via INTRAVENOUS
  Administered 2020-05-06: 3000 [IU] via INTRAVENOUS
  Administered 2020-05-06: 4000 [IU] via INTRAVENOUS

## 2020-05-06 MED ORDER — ATORVASTATIN CALCIUM 80 MG PO TABS
80.0000 mg | ORAL_TABLET | Freq: Every day | ORAL | Status: DC
  Administered 2020-05-06 – 2020-05-07 (×2): 80 mg via ORAL
  Filled 2020-05-06 (×2): qty 1

## 2020-05-06 MED ORDER — LIDOCAINE HCL (PF) 1 % IJ SOLN
INTRAMUSCULAR | Status: DC | PRN
  Administered 2020-05-06: 5 mL
  Administered 2020-05-06: 20 mL

## 2020-05-06 MED ORDER — ONDANSETRON HCL 4 MG/2ML IJ SOLN: 4.0000 mg | Freq: Four times a day (QID) | INTRAMUSCULAR | Status: DC | PRN

## 2020-05-06 MED ORDER — ASPIRIN 81 MG PO CHEW: 81.0000 mg | CHEWABLE_TABLET | Freq: Every day | ORAL | Status: DC

## 2020-05-06 MED ORDER — ORAL CARE MOUTH RINSE
15.0000 mL | Freq: Two times a day (BID) | OROMUCOSAL | Status: DC
  Administered 2020-05-06 (×2): 15 mL via OROMUCOSAL

## 2020-05-06 MED ORDER — PERFLUTREN LIPID MICROSPHERE
1.0000 mL | INTRAVENOUS | Status: AC | PRN
  Administered 2020-05-06: 2 mL via INTRAVENOUS
  Filled 2020-05-06: qty 10

## 2020-05-06 MED ORDER — HEPARIN SODIUM (PORCINE) 5000 UNIT/ML IJ SOLN
4000.0000 [IU] | Freq: Once | INTRAMUSCULAR | Status: AC
  Administered 2020-05-06: 4000 [IU] via INTRAVENOUS

## 2020-05-06 MED ORDER — HEPARIN SODIUM (PORCINE) 1000 UNIT/ML IJ SOLN
INTRAMUSCULAR | Status: AC
  Filled 2020-05-06: qty 1

## 2020-05-06 MED ORDER — HYDRALAZINE HCL 10 MG PO TABS
10.0000 mg | ORAL_TABLET | Freq: Three times a day (TID) | ORAL | Status: DC
  Administered 2020-05-06 – 2020-05-07 (×4): 10 mg via ORAL
  Filled 2020-05-06 (×4): qty 1

## 2020-05-06 MED ORDER — NITROGLYCERIN 1 MG/10 ML FOR IR/CATH LAB
INTRA_ARTERIAL | Status: AC
  Filled 2020-05-06: qty 10

## 2020-05-06 MED ORDER — SODIUM CHLORIDE 0.9 % IV SOLN: INTRAVENOUS | Status: DC

## 2020-05-06 MED ORDER — INSULIN STARTER KIT- PEN NEEDLES (ENGLISH)
1.0000 | Freq: Once | Status: DC
  Filled 2020-05-06 (×2): qty 1

## 2020-05-06 MED ORDER — TICAGRELOR 90 MG PO TABS
ORAL_TABLET | ORAL | Status: AC
  Filled 2020-05-06: qty 1

## 2020-05-06 MED ORDER — IOHEXOL 350 MG/ML SOLN
INTRAVENOUS | Status: DC | PRN
  Administered 2020-05-06: 190 mL

## 2020-05-06 MED ORDER — HEPARIN (PORCINE) IN NACL 1000-0.9 UT/500ML-% IV SOLN
INTRAVENOUS | Status: AC
  Filled 2020-05-06: qty 1000

## 2020-05-06 MED ORDER — LABETALOL HCL 5 MG/ML IV SOLN: 10.0000 mg | INTRAVENOUS | Status: AC | PRN

## 2020-05-06 MED ORDER — SODIUM CHLORIDE 0.9% FLUSH
3.0000 mL | INTRAVENOUS | Status: DC | PRN
  Administered 2020-05-06: 3 mL via INTRAVENOUS

## 2020-05-06 MED ORDER — ACETAMINOPHEN 325 MG PO TABS: 650.0000 mg | ORAL_TABLET | ORAL | Status: DC | PRN

## 2020-05-06 MED ORDER — FUROSEMIDE 10 MG/ML IJ SOLN
INTRAMUSCULAR | Status: AC
  Filled 2020-05-06: qty 4

## 2020-05-06 MED ORDER — INSULIN ASPART 100 UNIT/ML ~~LOC~~ SOLN
0.0000 [IU] | Freq: Three times a day (TID) | SUBCUTANEOUS | Status: DC
  Administered 2020-05-06 (×2): 5 [IU] via SUBCUTANEOUS
  Administered 2020-05-06: 11 [IU] via SUBCUTANEOUS
  Administered 2020-05-07: 5 [IU] via SUBCUTANEOUS

## 2020-05-06 MED ORDER — LIDOCAINE HCL (PF) 1 % IJ SOLN
INTRAMUSCULAR | Status: AC
  Filled 2020-05-06: qty 30

## 2020-05-06 MED ORDER — TICAGRELOR 90 MG PO TABS
ORAL_TABLET | ORAL | Status: DC | PRN
  Administered 2020-05-06: 180 mg via ORAL

## 2020-05-06 MED ORDER — PNEUMOCOCCAL VAC POLYVALENT 25 MCG/0.5ML IJ INJ
0.5000 mL | INJECTION | INTRAMUSCULAR | Status: AC
  Administered 2020-05-07: 0.5 mL via INTRAMUSCULAR
  Filled 2020-05-06: qty 0.5

## 2020-05-06 MED ORDER — CHLORHEXIDINE GLUCONATE CLOTH 2 % EX PADS
6.0000 | MEDICATED_PAD | Freq: Every day | CUTANEOUS | Status: DC
  Administered 2020-05-07: 6 via TOPICAL

## 2020-05-06 MED ORDER — HYDRALAZINE HCL 20 MG/ML IJ SOLN
10.0000 mg | INTRAMUSCULAR | Status: AC | PRN
  Administered 2020-05-06: 10 mg via INTRAVENOUS
  Filled 2020-05-06: qty 1

## 2020-05-06 MED ORDER — HEPARIN (PORCINE) IN NACL 1000-0.9 UT/500ML-% IV SOLN
INTRAVENOUS | Status: DC | PRN
  Administered 2020-05-06 (×2): 500 mL

## 2020-05-06 MED ORDER — ASPIRIN EC 81 MG PO TBEC
81.0000 mg | DELAYED_RELEASE_TABLET | Freq: Every day | ORAL | Status: DC
  Administered 2020-05-06 – 2020-05-07 (×2): 81 mg via ORAL
  Filled 2020-05-06 (×2): qty 1

## 2020-05-06 MED ORDER — SODIUM CHLORIDE 0.9 % IV SOLN: 250.0000 mL | INTRAVENOUS | Status: DC | PRN

## 2020-05-06 MED ORDER — SODIUM CHLORIDE 0.9% FLUSH
3.0000 mL | Freq: Two times a day (BID) | INTRAVENOUS | Status: DC
  Administered 2020-05-06 (×2): 3 mL via INTRAVENOUS

## 2020-05-06 MED ORDER — MORPHINE SULFATE (PF) 2 MG/ML IV SOLN: 2.0000 mg | INTRAVENOUS | Status: DC | PRN

## 2020-05-06 MED ORDER — VERAPAMIL HCL 2.5 MG/ML IV SOLN
INTRA_ARTERIAL | Status: DC | PRN
  Administered 2020-05-06: 7 mL via INTRA_ARTERIAL

## 2020-05-06 MED ORDER — TICAGRELOR 90 MG PO TABS
90.0000 mg | ORAL_TABLET | Freq: Two times a day (BID) | ORAL | Status: DC
  Administered 2020-05-06 – 2020-05-07 (×3): 90 mg via ORAL
  Filled 2020-05-06 (×3): qty 1

## 2020-05-06 MED ORDER — NITROGLYCERIN 0.4 MG SL SUBL: 0.4000 mg | SUBLINGUAL_TABLET | SUBLINGUAL | Status: DC | PRN

## 2020-05-06 MED ORDER — LIVING WELL WITH DIABETES BOOK
Freq: Once | Status: DC
  Filled 2020-05-06: qty 1

## 2020-05-06 SURGICAL SUPPLY — 21 items
BALLN SAPPHIRE 2.0X12 (BALLOONS) ×2
BALLN SAPPHIRE ~~LOC~~ 3.25X15 (BALLOONS) ×1 IMPLANT
BALLOON SAPPHIRE 2.0X12 (BALLOONS) IMPLANT
CATH INFINITI 5FR MULTPACK ANG (CATHETERS) ×1 IMPLANT
CATH OPTITORQUE TIG 4.0 5F (CATHETERS) ×1 IMPLANT
CATH VISTA GUIDE 6FR JR4 (CATHETERS) ×1 IMPLANT
DEVICE RAD COMP TR BAND LRG (VASCULAR PRODUCTS) ×1 IMPLANT
GLIDESHEATH SLEND A-KIT 6F 22G (SHEATH) ×1 IMPLANT
GUIDEWIRE INQWIRE 1.5J.035X260 (WIRE) IMPLANT
INQWIRE 1.5J .035X260CM (WIRE) ×2
KIT ENCORE 26 ADVANTAGE (KITS) ×1 IMPLANT
KIT HEART LEFT (KITS) ×2 IMPLANT
PACK CARDIAC CATHETERIZATION (CUSTOM PROCEDURE TRAY) ×2 IMPLANT
SHEATH PINNACLE 6F 10CM (SHEATH) ×1 IMPLANT
SHEATH PROBE COVER 6X72 (BAG) ×2 IMPLANT
STENT RESOLUTE ONYX 3.0X18 (Permanent Stent) ×1 IMPLANT
TRANSDUCER W/STOPCOCK (MISCELLANEOUS) ×2 IMPLANT
TUBING CIL FLEX 10 FLL-RA (TUBING) ×2 IMPLANT
WIRE ASAHI PROWATER 180CM (WIRE) ×1 IMPLANT
WIRE EMERALD 3MM-J .035X150CM (WIRE) ×1 IMPLANT
WIRE HI TORQ VERSACORE-J 145CM (WIRE) ×2 IMPLANT

## 2020-05-06 NOTE — ED Provider Notes (Signed)
[facility] EMERGENCY DEPARTMENT Provider Note   CSN: [id number] Arrival date & time: 05/06/20  0150     History Chief Complaint  Patient presents with  . Code STEMI    Jonathan Miller is a 65 y.o. male.  HPI     This is a 64 year old male with a history of hypertension, obesity, tobacco abuse, diabetes who presents as a STEMI from [facility]. He was woken out of sleep with 8 out of 10 left-sided chest pain. Reports that it radiates into his left jaw and left arm. States that he may have had one episode of similar pain several weeks ago while working. He works currently as a Arboriculturist. EMS noted inferior ST elevations. Patient took 324 aspirin prior to EMS arrival. They report initial blood pressure was 80/40. Patient is currently having 7 out of 10 pain. Denies any recent illnesses or fevers. Denies any recent Covid contacts.  Level 5 caveat for acuity of condition.  History reviewed. No pertinent past medical history. . Arthritis    "right shoulder" (02/09/2016)  . Bell's palsy 01/2012; 02/09/2016  . Daily headache    "recently" (02/09/2016)  . GERD (gastroesophageal reflux disease)   . History of hiatal hernia   . HTN (hypertension)   . Obesity   . Tobacco abuse   . Type II diabetes mellitus (HCC)     There are no problems to display for this patient.   History reviewed. No pertinent surgical history.     No family history on file.  Social History   Tobacco Use  . Smoking status: Not on file  Substance Use Topics  . Alcohol use: Not on file  . Drug use: Not on file    Home Medications Prior to Admission medications   Not on File    Allergies    Patient has no known allergies.  Review of Systems   Review of Systems  Respiratory: Negative for shortness of breath.   Cardiovascular: Positive for chest pain.  Gastrointestinal: Negative for abdominal pain and blood in stool.  All other systems reviewed and are  negative.   Physical Exam Updated Vital Signs BP (!) 147/84   Pulse 81   Resp (!) 26   Ht 1.651 m (5\' 5" )   Wt 88.5 kg   SpO2 100%   BMI 32.45 kg/m   Physical Exam Vitals and nursing note reviewed.  Constitutional:      Appearance: He is well-developed. He is obese. He is not ill-appearing.  HENT:     Head: Normocephalic and atraumatic.     Nose: Nose normal.     Mouth/Throat:     Mouth: Mucous membranes are moist.  Eyes:     Pupils: Pupils are equal, round, and reactive to light.  Cardiovascular:     Rate and Rhythm: Normal rate and regular rhythm.     Pulses: Normal pulses.     Heart sounds: No murmur heard.   Pulmonary:     Effort: Pulmonary effort is normal. No respiratory distress.  Abdominal:     Palpations: Abdomen is soft.     Tenderness: There is no abdominal tenderness.  Musculoskeletal:     Cervical back: Neck supple.     Right lower leg: No edema.     Left lower leg: No edema.  Lymphadenopathy:     Cervical: No cervical adenopathy.  Skin:    General: Skin is warm and dry.  Neurological:     Mental Status: He  is alert and oriented to person, place, and time.  Psychiatric:        Mood and Affect: Mood normal.     ED Results / Procedures / Treatments   Labs (all labs ordered are listed, but only abnormal results are displayed) Labs Reviewed  SARS CORONAVIRUS 2 BY RT PCR (HOSPITAL ORDER, PERFORMED IN [facility])  HEMOGLOBIN A1C  CBC WITH DIFFERENTIAL/PLATELET  PROTIME-INR  APTT  COMPREHENSIVE METABOLIC PANEL  LIPID PANEL  TROPONIN I (HIGH SENSITIVITY)    EKG EKG Interpretation  Date/Time:  Tuesday May 06 2020 01:53:47 EDT Ventricular Rate:  72 PR Interval:    QRS Duration: 103 QT Interval:  416 QTC Calculation: 456 R Axis:   84 Text Interpretation: Sinus rhythm Inferior infarct, acute (RCA) Probable RV involvement, suggest recording right precordial leads >>> Acute MI <<< ** ** ACUTE MI / STEMI ** ** Confirmed by  Ross Marcus ([id number]) on 05/06/2020 1:58:30 AM   Radiology No results found.  Procedures Procedures (including critical care time)  Medications Ordered in ED Medications  0.9 %  sodium chloride infusion ( Intravenous New Bag/Given 05/06/20 0159)  aspirin chewable tablet 324 mg (has no administration in time range)  heparin injection 4,000 Units (4,000 Units Intravenous Given 05/06/20 0157)    ED Course  I have reviewed the triage vital signs and the nursing notes.  Pertinent labs & imaging results that were available during my care of the patient were reviewed by me and considered in my medical decision making (see chart for details).    MDM Rules/Calculators/A&P                          Patient presents as a code STEMI by [facility]. Clinically stable. Upon arrival blood pressure 147/84. Pulse 81. He is in no respiratory distress. Had aspirin prior to arrival. History is highly concerning for ACS and he has significant risk factors. Repeat EKG here confirms inferior ST elevations. Patient was given 4000 units of heparin. Cardiology fellow at the bedside. We'll plan for transfer to Cath Lab upon their arrival.  Final Clinical Impression(s) / ED Diagnoses Final diagnoses:  ST elevation myocardial infarction (STEMI), unspecified artery [facility])    Rx / DC Orders ED Discharge Orders    None       Britanni Yarde, Mayer Masker, MD 05/06/20 [phone]

## 2020-05-06 NOTE — Progress Notes (Signed)
CARDIAC REHAB PHASE I   PRE:  Rate/Rhythm: 77 SR  BP:  Supine: 132/81  Sitting:   Standing:    SaO2: 92%RA  MODE:  Ambulation: 620 ft   POST:  Rate/Rhythm: 88 SR  BP:  Supine:   Sitting: 139/76  Standing:    SaO2: 95%RA 1345-1455 Pt happy to get OOB and walk. No CP and gait steady. Walked 620 ft and then to recliner with call bell. Wife in room. MI education completed with pt who voiced understanding. He stated he would translate for wife later. Discussed importance of brilinta with stent. Reviewed MI restrictions, NTG use, heart healthy food choices and carb counting, walking for exercise, smoking cessation and CRP 2. Pt plans to quit cold [place]. He quit once for 2 years. Gave smoking cessation handout and encouraged him to call [phone] quitnow if needed. Will refer to [place] CRP 2. Pt is hopeful he can get insurance from school system soon. Does not have smart phone or ipad for Virtual program. Has computer at home with internet.  8/17/20212:41 PM

## 2020-05-06 NOTE — Progress Notes (Signed)
  Echocardiogram 2D Echocardiogram has been performed.  Jonathan Miller 05/06/2020, 11:45 AM

## 2020-05-06 NOTE — TOC Benefit Eligibility Note (Signed)
Transition of Care [facility]) Benefit Eligibility Note    Patient Details  Name: Jonathan Miller MRN: [id number] Date of Birth: 10/01/1955   Medication/Dose: BRILINTA 90 MG  BID                       Additional Notes: SELF-PAY  and  NO PHARMACY BENEFIT ON [place] [place] Phone Number: 05/06/2020, 1:20 PM

## 2020-05-06 NOTE — Progress Notes (Addendum)
Inpatient Diabetes Program Recommendations  AACE/ADA: New Consensus Statement on Inpatient Glycemic Control (2015)  Target Ranges:  Prepandial:   less than 140 mg/dL      Peak postprandial:   less than 180 mg/dL (1-2 hours)      Critically ill patients:  140 - 180 mg/dL   Lab Results  Component Value Date   GLUCAP 210 (H) 05/06/2020   HGBA1C 11.8 (H) 05/06/2020    Review of Glycemic Control Results for TAO, SATZ (MRN [id number]) as of 05/06/2020 11:52  Ref. Range 05/06/2020 04:47 05/06/2020 06:38 05/06/2020 11:31  Glucose-Capillary Latest Ref Range: 70 - 99 mg/dL 300 (H) 923 (H) 300 (H)   Diabetes history: DM2 Outpatient Diabetes medications: Metformin prescribed but patient not taking Current orders for Inpatient glycemic control: Novolog moderate correction tid  Inpatient Diabetes Program Recommendations:   -Lantus 18 units qd -Add Novolog hs correction 0-5 units -Novolog 4 units tid meal coverage if eats 50%  Noted patient does not have insurance. May do best on Relion Novolin 70/30 insulin bid ac meals. Based on Lantus 18 units daily will = 70/30 8 units bid and adjust as needed. Will order Living Well With Diabetes and speak with patient regarding diabetes.  Thank you, Billy Fischer. Emma Schupp, RN, MSN, CDE  Diabetes Coordinator Inpatient Glycemic Control Team Team Pager [phone] (8am-5pm) 05/06/2020 12:00 PM

## 2020-05-06 NOTE — Plan of Care (Signed)

## 2020-05-06 NOTE — ED Notes (Signed)
Cards provider bedside talking to wife and pt.  Pt is going to the Cath lab.    Verified pt did take 4 baby ASA.  No more given.

## 2020-05-06 NOTE — Progress Notes (Signed)
Received call from nurse about oozing at cath site. They had to hold pressure earlier this evening for this. Arlys Erlin, cath lab supervisor, over to assess who felt the site overall looked OK with minimal bleeding and given current ACT level and timeframe nurse should pull sheath - if further bleeding, fem-stop could be considered but he did not see indication for such at present time pending assessment of removal of sheath first. Ronie Spies PA-C

## 2020-05-06 NOTE — H&P (Addendum)
Cardiology Admission History and Physical:   Patient ID: Jonathan Miller MRN: [id number]; DOB: March 07, 1956   Admission date: 05/06/2020  Primary Care Provider: No primary care provider on file. [facility] Cardiologist: No primary care provider on file.  [facility] Electrophysiologist:  None   Chief Complaint:  Chest pain  Patient Profile:   Jonathan Miller is a 64 y.o. male with hypertension, type 2 diabetes, and tobacco use presenting with inferior STEMI.   History of Present Illness:   Jonathan Miller was awakened from sleep this morning around midnight with substernal chest pain with radiation to left arm and jaw.  He took 324 mg of aspirin and called 911.  On EMS arrival, EKG showed sinus bradycardia in the 40s with ST elevations in leads II, III, and aVF.  Blood pressure was 80s over 40s on EMS arrival.  When he reached the emergency department, he blood pressure was 160s over 90s with heart rate in the 70s and persistent inferior ST elevations.  Chest pain had improved, but arm and jaw pain persisted.  He was taken emergently to the cardiac Cath Lab where he was found to have acute occlusion of mid RCA.  He received PCI with a 3 mm x 18 mm Onyx drug-eluting stent via right radial arterial access.  He received ticagrelor load in the Cath Lab.  Right femoral arterial access was initially attempted but he was found to have occluded common femoral artery on the right side.  Sheath was left in place given elevated ACT.  LVEDP was 27 mmHg.  Upon arrival to the ICU, he is chest pain-free.  He feels much better.  He reports that he has previously been treated at the clinic across the street from the hospital for diabetes and hypertension, but he has not been seen there since 2019.  He was taking no meds prior to this admission.    Past Medical History:  Diagnosis Date   GERD (gastroesophageal reflux disease)    Hiatal hernia    Hyperlipidemia    Hypertension    Obesity    Tobacco use     Type 2 diabetes mellitus ([place])     Past Surgical History:  Procedure Laterality Date   NO PAST SURGERIES       Medications Prior to Admission: None  Allergies:   No Known Allergies  Social History:  Married. 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 He smokes 1 ppd for approximately 50 years.  Social History   Socioeconomic History   Marital status: Married    Spouse name: Not on file   Number of children: Not on file   Years of education: Not on file   Highest education level: Not on file  Occupational History   Not on file  Tobacco Use   Smoking status: Not on file  Substance and Sexual Activity   Alcohol use: Not on file   Drug use: Not on file   Sexual activity: Not on file  Other Topics Concern   Not on file  Social History Narrative   Not on file   Social Determinants of Health   Financial Resource Strain:    Difficulty of Paying Living Expenses:   Food Insecurity:    Worried About [place] in the Last Year:    [place] in the Last Year:   Transportation Needs:    Lack of Transportation (Medical):    Lack of Transportation (Non-Medical):   Physical Activity:  Days of Exercise per Week:    Minutes of Exercise per Session:   Stress:    Feeling of Stress :   Social Connections:    Frequency of Communication with Friends and Family:    Frequency of Social Gatherings with Friends and Family:    Attends Religious Services:    Active Member of Clubs or Organizations:    Attends Music therapist:    Marital Status:   Intimate Partner Violence:    Fear of Current or Ex-Partner:    Emotionally Abused:    Physically Abused:    Sexually Abused:     Family History:   The patient's family history is unknown. Parents are deceased    ROS:  Please see the history of present illness.  All other ROS reviewed and negative.     Physical Exam/Data:   Vitals:   05/06/20 0430  05/06/20 0445 05/06/20 0500 05/06/20 0515  BP: (!) 171/98 (!) 162/100 (!) 153/76 (!) 138/95  Pulse:  83 71 79  Resp:  (!) 25 (!) 21 19  Temp:      TempSrc:      SpO2:  94% 95% 94%  Weight: 94.1 kg     Height:        Intake/Output Summary (Last 24 hours) at 05/06/2020 0528 Last data filed at 05/06/2020 0450 Gross per 24 hour  Intake --  Output 850 ml  Net -850 ml   Last 3 Weights 05/06/2020 05/06/2020  Weight (lbs) 207 lb 7.3 oz 195 lb  Weight (kg) 94.1 kg 88.451 kg     Body mass index is 34.52 kg/m.  General:  Well nourished, well developed, in no acute distress HEENT: normal Lymph: no adenopathy Neck:  JVD is present Endocrine:  No thryomegaly Vascular: No carotid bruits; FA pulses 2+ bilaterally without bruits  Cardiac:  normal S1, S2; RRR; no murmur  Lungs:  Faint bibasilar crackles Abd: soft, nontender, no hepatomegaly  Ext: no edema Musculoskeletal:  No deformities, BUE and BLE strength normal and equal Skin: warm and dry  Neuro:  CNs 2-12 intact, no focal abnormalities noted Psych:  Normal affect   EKG:  The ECG that was done 05/06/2020 at 02:58 am was personally reviewed and demonstrates sinus rhythm with ST elevations in inferior leads  Relevant CV Studies: Echo pending   Laboratory Data:  High Sensitivity Troponin:   Recent Labs  Lab 05/06/20 0201  TROPONINIHS 12      Chemistry Recent Labs  Lab 05/06/20 0201  NA 134*  K 4.5  CL 102  CO2 22  GLUCOSE 326*  BUN 21  CREATININE 1.37*  CALCIUM 8.9  GFRNONAA 54*  GFRAA >60  ANIONGAP 10    Recent Labs  Lab 05/06/20 0201  PROT 5.5*  ALBUMIN 2.9*  AST 22  ALT 24  ALKPHOS 56  BILITOT 0.9   Hematology Recent Labs  Lab 05/06/20 0201  WBC 11.7*  RBC 4.84  HGB 14.8  HCT 46.1  MCV 95.2  MCH 30.6  MCHC 32.1  RDW 12.5  PLT 257   BNPNo results for input(s): BNP, PROBNP in the last 168 hours.  DDimer No results for input(s): DDIMER in the last 168 hours.   Radiology/Studies:  CARDIAC  CATHETERIZATION  Result Date: 05/06/2020  Mid RCA lesion is 99% stenosed.  A drug-eluting stent was successfully placed using a STENT RESOLUTE ONYX 3.0X18.  Post intervention, there is a 0% residual stenosis.  Jonathan Miller is a 64 y.o. male  [id number] LOCATION:  FACILITY: [facility] PHYSICIAN: Quay Burow, M.D. 1955/10/09 DATE OF PROCEDURE:  05/06/2020 DATE OF DISCHARGE: CARDIAC CATHETERIZATION / PCI DES RCA History obtained from chart review.  Jonathan Miller is a 64 year old married [place] male without prior history of CAD.  Does have a history of tobacco abuse and hypertension.  He was awoken from sleep at midnight tonight and EMS was called.  His EKG showed inferior ST segment elevation and he was transported to [facility] for urgent catheterization and intervention. PROCEDURE DESCRIPTION: The patient was brought to the second floor [facility] cath lab in the postabsorptive state. He was not premedicated . His right wrist and groin Were prepped and shaved in usual sterile fashion. Xylocaine 1% was used for local anesthesia. A 6 French sheath was inserted into the right common femoral  artery using standard Seldinger technique.  Ultrasound guidance was used to obtain access.  Angiography revealed that his right common iliac artery was totally occluded and therefore radial access was obtained.  The patient received 4000 units  of heparin intravenously.  A 5 [place] TIG catheter and pigtail catheters were used for selective coronary angiography and obtain left heart pressures.  Isovue dye is used for the entirety of the case.  Retrograde aorta, ventricular and pullback pressures were recorded.  Radial cocktail was administered via the SideArm sheath. The patient received 180 mg of p.o. Brilinta.  He received a total of 11,000 as of heparin with an ACT of approximately 300.  Isovue dye is used for the entirety of the intervention.  Retrograde aortic pressure was monitored during the case.  Using a 6 [place]  JR4 guide cath along was with an 014 [place] guidewire and a 2 mm x 12 mm balloon the mid subtotally occluded thrombotic RCA lesion was crossed with some difficulty and predilated.  Following this a 3 mm x 18 mm long Medtronic resolute Onyx drug-eluting stent was then deployed successfully and postdilated with a 3.25 mm x 15 mm long noncompliant balloon at 14 atm.   Jonathan Miller had inferior STEMI status post PCI drug-eluting stenting of dominant RCA.  No other significant disease.  He did have a 50 to 60% proximal RCA stenosis as well.  The radial sheath was removed and a TR band was placed on the right wrist to achieve patent hemostasis.  The patient left lab stable condition.  He will need uninterrupted dual antiplatelet therapy for at least 12 months as well as risk factor modification including smoking cessation and treatment of his type 2 diabetes.  His hemoglobin A1c is 11.  2D echo will be obtained.  He also has PAD with an occluded right iliac and some right lower extremity claudication.  I will see him back in the office to further discuss his PAD.  He will need guideline directed optimal medical therapy including high-dose statin therapy, beta-blocker heart rate permitting. Quay Burow. MD, [facility] 05/06/2020 3:55 AM   DG Chest Port 1 View  Result Date: 05/06/2020 CLINICAL DATA:  STEMI EXAM: PORTABLE CHEST 1 VIEW COMPARISON:  Radiograph 02/09/2016 FINDINGS: Chronic hyperinflation and coarsened interstitial opacities compatible with history of COPD. Some bandlike streaky and hazy opacities in the infrahilar lungs could reflect some mild atelectasis or early edema. No focal consolidative opacity is seen. No pneumothorax. Partial obscuration of the left costophrenic sulcus could reflect trace pleural fluid. The aorta is calcified. The remaining cardiomediastinal contours are unremarkable. No acute osseous or soft tissue abnormality. 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 Telemetry leads overlie the chest. IMPRESSION: 1. Chronic hyperinflation and coarsened interstitial opacities compatible with history of COPD. 2. Some bandlike streaky and hazy opacities in the infrahilar lungs could reflect some mild atelectasis or early edema. 3. 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 Electronically Signed   By: Lovena Le M.D.   On: 05/06/2020 02:14   TIMI Risk Score for ST  Elevation MI:   The patient's TIMI risk score is 3, which indicates a 4.4% risk of all cause mortality at 30 days.    New York Heart Association (NYHA) Functional Class NYHA Class II  Assessment and Plan:   1. Inferior STEMI, s/p PCI on 05/06/2020 -Dual antiplatelet therapy with aspirin and ticagrelor for at least 1 year -Start high intensity statin: Atorvastatin 40 mg daily  -Transthoracic echo -He has NYHA a class II symptoms, elevated LVEDP, therefore will hold beta-blockers pending echo findings -Hold ACE inhibitor/ARB pending trend in renal function -Lasix given in the Cath Lab, will reexamine volume status this morning  2. Hypertension He would benefit from long-term therapy with ARB and beta-blocker, however we are holding both currently -Hydralazine 10 mg every 8 hours for afterload reduction  3.  AKI versus CKD stage II Creatinine in 2019 was 0.7-0.8.  Creatinine today is 1.37, unclear if this is AKI in the setting of MI or if he has had progression of CKD in the past 2 years.  -Avoid nephrotoxic agents, trend creatinine level  4.  Hyperlipidemia LDL is 121 and total cholesterol 229 -Start atorvastatin 80 mg daily.  He should have lipid panel checked in 2 to 3 months with addition of adjunct agents if still above goal.  Goal for him would be LDL less than 70 mg/dL  5.  Type 2 diabetes, uncontrolled A1c is 11.8% -Sliding scale  insulin -Diabetes education while admitted -Will need oral regimen on discharge  Severity of Illness: The appropriate patient status for this patient is INPATIENT. Inpatient status is judged to be reasonable and necessary in order to provide the required intensity of service to ensure the patient's safety. The patient's presenting symptoms, physical exam findings, and initial radiographic and laboratory data in the context of their chronic comorbidities is felt to place them at high risk for further clinical deterioration. Furthermore, it is not anticipated that the patient will be medically stable for discharge from the hospital within 2 midnights of admission. The following factors support the patient status of inpatient.   " The patient's presenting symptoms include chest pain. " The worrisome physical exam findings include STEMI. " The initial radiographic and laboratory data are worrisome because of troponin elevation, occluded RCA. " The chronic co-morbidities include diabetes, hypertension.  * I certify that at the point of admission it is my clinical judgment that the patient will require inpatient hospital care spanning beyond 2 midnights from the point of admission due to high intensity of service, high risk for further deterioration and high frequency of surveillance required.*    For questions or updates, please contact [place] Please consult [email or web address] for contact info under     Signed, Osvaldo Shipper, MD  05/06/2020 5:28 AM

## 2020-05-06 NOTE — Progress Notes (Addendum)
Progress Note  Patient Name: Lamarius Dirr Date of Encounter: 05/06/2020  [facility] Cardiologist: Gwenlyn Found  Subjective   No chest pain  Inpatient Medications    Scheduled Meds: . aspirin EC  81 mg Oral Daily  . atorvastatin  80 mg Oral Daily  . heparin  5,000 Units Subcutaneous Q8H  . hydrALAZINE  10 mg Oral Q8H  . insulin aspart  0-15 Units Subcutaneous TID WC  . mouth rinse  15 mL Mouth Rinse BID  . [START ON 05/07/2020] pneumococcal 23 valent vaccine  0.5 mL Intramuscular Tomorrow-1000  . sodium chloride flush  3 mL Intravenous Q12H  . ticagrelor  90 mg Oral BID   Continuous Infusions:  PRN Meds: acetaminophen, hydrALAZINE, labetalol, morphine injection, nitroGLYCERIN, ondansetron (ZOFRAN) IV, sodium chloride flush   Vital Signs    Vitals:   05/06/20 0630 05/06/20 0645 05/06/20 0700 05/06/20 0756  BP: 130/84 136/83 (!) 141/100   Pulse: 77 76 78   Resp: '18 19 14   ' Temp:    98.5 F (36.9 C)  TempSrc:    Oral  SpO2: 93% 94% 94%   Weight:      Height:        Intake/Output Summary (Last 24 hours) at 05/06/2020 0805 Last data filed at 05/06/2020 0646 Gross per 24 hour  Intake --  Output 2300 ml  Net -2300 ml   Last 3 Weights 05/06/2020 05/06/2020  Weight (lbs) 207 lb 7.3 oz 195 lb  Weight (kg) 94.1 kg 88.451 kg      Telemetry    sinus - Personally Reviewed  ECG    Sinus, inferior ST elevation- Personally Reviewed  Physical Exam   GEN: No acute distress.   Neck: No JVD Cardiac: RRR, no murmurs, rubs, or gallops.  Respiratory: Clear to auscultation bilaterally. GI: Soft, nontender, non-distended  MS: No edema; No deformity. Neuro:  Nonfocal  Psych: Normal affect   Labs    High Sensitivity Troponin:   Recent Labs  Lab 05/06/20 0201 05/06/20 0429  TROPONINIHS 12 >27,000*      Chemistry Recent Labs  Lab 05/06/20 0201 05/06/20 0429  NA 134* 132*  K 4.5 4.3  CL 102 97*  CO2 22 23  GLUCOSE 326* 357*  BUN 21 20  CREATININE 1.37* 1.21   CALCIUM 8.9 9.4  PROT 5.5*  --   ALBUMIN 2.9*  --   AST 22  --   ALT 24  --   ALKPHOS 56  --   BILITOT 0.9  --   GFRNONAA 54* >60  GFRAA >60 >60  ANIONGAP 10 12     Hematology Recent Labs  Lab 05/06/20 0201 05/06/20 0429  WBC 11.7* 12.0*  RBC 4.84 5.23  HGB 14.8 16.0  HCT 46.1 48.1  MCV 95.2 92.0  MCH 30.6 30.6  MCHC 32.1 33.3  RDW 12.5 12.7  PLT 257 289    BNPNo results for input(s): BNP, PROBNP in the last 168 hours.   DDimer No results for input(s): DDIMER in the last 168 hours.   Radiology    CARDIAC CATHETERIZATION  Result Date: 05/06/2020  Mid RCA lesion is 99% stenosed.  A drug-eluting stent was successfully placed using a STENT RESOLUTE ONYX 3.0X18.  Post intervention, there is a 0% residual stenosis.  Rueben Kassim is a 64 y.o. male  [id number] LOCATION:  FACILITY: [place] PHYSICIAN: Quay Burow, M.D. 02-11-1956 DATE OF PROCEDURE:  05/06/2020 DATE OF DISCHARGE: CARDIAC CATHETERIZATION / PCI DES RCA History obtained from chart  review.  Mr. Rotert is a 64 year old married [place] male without prior history of CAD.  Does have a history of tobacco abuse and hypertension.  He was awoken from sleep at midnight tonight and EMS was called.  His EKG showed inferior ST segment elevation and he was transported to [facility] for urgent catheterization and intervention. PROCEDURE DESCRIPTION: The patient was brought to the second floor [place] Cardiac cath lab in the postabsorptive state. He was not premedicated . His right wrist and groin Were prepped and shaved in usual sterile fashion. Xylocaine 1% was used for local anesthesia. A 6 French sheath was inserted into the right common femoral  artery using standard Seldinger technique.  Ultrasound guidance was used to obtain access.  Angiography revealed that his right common iliac artery was totally occluded and therefore radial access was obtained.  The patient received 4000 units  of heparin intravenously.  A 5 [place] TIG  catheter and pigtail catheters were used for selective coronary angiography and obtain left heart pressures.  Isovue dye is used for the entirety of the case.  Retrograde aorta, ventricular and pullback pressures were recorded.  Radial cocktail was administered via the SideArm sheath. The patient received 180 mg of p.o. Brilinta.  He received a total of 11,000 as of heparin with an ACT of approximately 300.  Isovue dye is used for the entirety of the intervention.  Retrograde aortic pressure was monitored during the case.  Using a 6 [place] JR4 guide cath along was with an 014 Prowater guidewire and a 2 mm x 12 mm balloon the mid subtotally occluded thrombotic RCA lesion was crossed with some difficulty and predilated.  Following this a 3 mm x 18 mm long Medtronic resolute Onyx drug-eluting stent was then deployed successfully and postdilated with a 3.25 mm x 15 mm long noncompliant balloon at 14 atm.   Mr. Yehle had inferior STEMI status post PCI drug-eluting stenting of dominant RCA.  No other significant disease.  He did have a 50 to 60% proximal RCA stenosis as well.  The radial sheath was removed and a TR band was placed on the right wrist to achieve patent hemostasis.  The patient left lab stable condition.  He will need uninterrupted dual antiplatelet therapy for at least 12 months as well as risk factor modification including smoking cessation and treatment of his type 2 diabetes.  His hemoglobin A1c is 11.  2D echo will be obtained.  He also has PAD with an occluded right iliac and some right lower extremity claudication.  I will see him back in the office to further discuss his PAD.  He will need guideline directed optimal medical therapy including high-dose statin therapy, beta-blocker heart rate permitting. Quay Burow. MD, [facility] 05/06/2020 3:55 AM   DG Chest Port 1 View  Result Date: 05/06/2020 CLINICAL DATA:  STEMI EXAM: PORTABLE CHEST 1 VIEW COMPARISON:  Radiograph 02/09/2016 FINDINGS: Chronic  hyperinflation and coarsened interstitial opacities compatible with history of COPD. Some bandlike streaky and hazy opacities in the infrahilar lungs could reflect some mild atelectasis or early edema. No focal consolidative opacity is seen. No pneumothorax. Partial obscuration of the left costophrenic sulcus could reflect trace pleural fluid. The aorta is calcified. The remaining cardiomediastinal contours are unremarkable. No acute osseous or soft tissue abnormality. 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 Telemetry leads overlie the chest. IMPRESSION: 1. Chronic  hyperinflation and coarsened interstitial opacities compatible with history of COPD. 2. Some bandlike streaky and hazy opacities in the infrahilar lungs could reflect some mild atelectasis or early edema. 3. 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 Electronically Signed   By: Lovena Le M.D.   On: 05/06/2020 02:14    Cardiac Studies   Cardiac cath 05/06/20:  Mid RCA lesion is 99% stenosed.  A drug-eluting stent was successfully placed using a STENT RESOLUTE ONYX 3.0X18.  Post intervention, there is a 0% residual stenosis.  Diagnostic Dominance: Right  Intervention     Patient Profile     64 y.o. male with history of HTN and tobacco abuse admitted with an inferior STEMI early on 05/06/20. Severe stenosis mid RCA treated with a drug eluting stent.   Assessment & Plan    1. CAD/Inferior STEMI: He is stable post PCI earlier this am. Will plan echo today to assess LVEF. Continue DAPT for one year with ASA and Brilinta. Continue high intensity statin. Will start a beta blocker today. Possible discharge tomorrow.   2. Diabetes mellitus: He says that he has diabetes but has not been taking his metformin. HgbA1C 11.8 this am. Will ask the DM management  team to provide education.   For questions or updates, please contact [place] Please consult [email or web address] for contact info under        Signed, Lauree Chandler, MD  05/06/2020, 8:05 AM

## 2020-05-06 NOTE — Care Management (Addendum)
Case management placed a benefits check for Brilinta 90 mg by mouth twice daily for 30 day supply.  I called and left a message with the patient and his wife to inquire if patient had any unlisted insurance coverage.  The patient was set up with a follow-up [facility] as noted in the follow-up appointments.  The patient's discharge pharmacy was changed to [facility] pharmacy so that patient could be provided with discharge medications from the hospital and follow up at the [facility] and Wellness clinic.

## 2020-05-06 NOTE — ED Triage Notes (Signed)
Pt [facility], pt was woken out of sleep from chest pain 8/10 now no pain.  Took 324 ASA prior to EMS arrived. Initial blood pressure was 80/40.  No prior hx

## 2020-05-06 NOTE — ED Notes (Signed)
Pt now complaining of left sided jaw pain that started upon arrival.

## 2020-05-07 ENCOUNTER — Encounter (HOSPITAL_COMMUNITY): Payer: Self-pay | Admitting: Cardiology

## 2020-05-07 ENCOUNTER — Telehealth: Payer: Self-pay | Admitting: Physician Assistant

## 2020-05-07 DIAGNOSIS — I10 Essential (primary) hypertension: Secondary | ICD-10-CM

## 2020-05-07 DIAGNOSIS — I255 Ischemic cardiomyopathy: Secondary | ICD-10-CM

## 2020-05-07 DIAGNOSIS — I5041 Acute combined systolic (congestive) and diastolic (congestive) heart failure: Secondary | ICD-10-CM | POA: Insufficient documentation

## 2020-05-07 DIAGNOSIS — N179 Acute kidney failure, unspecified: Secondary | ICD-10-CM

## 2020-05-07 DIAGNOSIS — Z720 Tobacco use: Secondary | ICD-10-CM

## 2020-05-07 DIAGNOSIS — E785 Hyperlipidemia, unspecified: Secondary | ICD-10-CM

## 2020-05-07 DIAGNOSIS — E1169 Type 2 diabetes mellitus with other specified complication: Secondary | ICD-10-CM

## 2020-05-07 DIAGNOSIS — I152 Hypertension secondary to endocrine disorders: Secondary | ICD-10-CM

## 2020-05-07 DIAGNOSIS — I739 Peripheral vascular disease, unspecified: Secondary | ICD-10-CM

## 2020-05-07 DIAGNOSIS — IMO0002 Reserved for concepts with insufficient information to code with codable children: Secondary | ICD-10-CM

## 2020-05-07 DIAGNOSIS — E1165 Type 2 diabetes mellitus with hyperglycemia: Secondary | ICD-10-CM

## 2020-05-07 LAB — CBC
HCT: 47.9 % (ref 39.0–52.0)
Hemoglobin: 15.6 g/dL (ref 13.0–17.0)
MCH: 30.2 pg (ref 26.0–34.0)
MCHC: 32.6 g/dL (ref 30.0–36.0)
MCV: 92.8 fL (ref 80.0–100.0)
Platelets: 285 10*3/uL (ref 150–400)
RBC: 5.16 MIL/uL (ref 4.22–5.81)
RDW: 13 % (ref 11.5–15.5)
WBC: 10.8 10*3/uL — ABNORMAL HIGH (ref 4.0–10.5)
nRBC: 0 % (ref 0.0–0.2)

## 2020-05-07 LAB — BASIC METABOLIC PANEL
Anion gap: 12 (ref 5–15)
BUN: 28 mg/dL — ABNORMAL HIGH (ref 8–23)
CO2: 22 mmol/L (ref 22–32)
Calcium: 9.2 mg/dL (ref 8.9–10.3)
Chloride: 101 mmol/L (ref 98–111)
Creatinine, Ser: 1.37 mg/dL — ABNORMAL HIGH (ref 0.61–1.24)
GFR calc Af Amer: >60 mL/min (ref >60)
GFR calc non Af Amer: 54 mL/min — ABNORMAL LOW (ref >60)
Glucose, Bld: 229 mg/dL — ABNORMAL HIGH (ref 70–99)
Potassium: 4.1 mmol/L (ref 3.5–5.1)
Sodium: 135 mmol/L (ref 135–145)

## 2020-05-07 LAB — GLUCOSE, CAPILLARY
Glucose-Capillary: 218 mg/dL — ABNORMAL HIGH (ref 70–99)
Glucose-Capillary: 198 mg/dL — ABNORMAL HIGH (ref 70–99)

## 2020-05-07 MED ORDER — TICAGRELOR 90 MG PO TABS: 90.0000 mg | ORAL_TABLET | Freq: Two times a day (BID) | ORAL | 11 refills | Status: DC

## 2020-05-07 MED ORDER — ATORVASTATIN CALCIUM 80 MG PO TABS: 80.0000 mg | ORAL_TABLET | Freq: Every day | ORAL | 6 refills | Status: DC

## 2020-05-07 MED ORDER — NITROGLYCERIN 0.4 MG SL SUBL: 0.4000 mg | SUBLINGUAL_TABLET | SUBLINGUAL | 3 refills | Status: AC | PRN

## 2020-05-07 MED ORDER — METOPROLOL TARTRATE 25 MG PO TABS: 25.0000 mg | ORAL_TABLET | Freq: Two times a day (BID) | ORAL | 6 refills | Status: DC

## 2020-05-07 MED ORDER — NOVOLIN 70/30 (70-30) 100 UNIT/ML ~~LOC~~ SUSP
8.0000 [IU] | Freq: Two times a day (BID) | SUBCUTANEOUS | 1 refills | Status: DC
Start: 2020-05-07 — End: 2020-05-19

## 2020-05-07 MED ORDER — ASPIRIN EC 81 MG PO TBEC: 81.0000 mg | DELAYED_RELEASE_TABLET | Freq: Every day | ORAL | 11 refills | Status: AC

## 2020-05-07 MED ORDER — PEN NEEDLES 32G X 4 MM MISC: 1.0000 | Freq: Two times a day (BID) | 2 refills | Status: AC

## 2020-05-07 MED ORDER — LISINOPRIL 5 MG PO TABS: 5.0000 mg | ORAL_TABLET | Freq: Every day | ORAL | 6 refills | Status: DC

## 2020-05-07 MED ORDER — LISINOPRIL 5 MG PO TABS
5.0000 mg | ORAL_TABLET | Freq: Every day | ORAL | Status: DC
  Administered 2020-05-07: 5 mg via ORAL
  Filled 2020-05-07: qty 1

## 2020-05-07 MED FILL — NOVOLIN 70/30 FLEXPEN (70-3: (70-30) 100 | 30 days supply | Qty: 6 | Fill #0

## 2020-05-07 MED FILL — NITROGLYCERIN 0.4 MG TAB SL: 0.4 | 8 days supply | Qty: 25 | Fill #0

## 2020-05-07 MED FILL — ASPIRIN LOW DOSE 81 MG TBEC: 81 | 30 days supply | Qty: 30 | Fill #0

## 2020-05-07 MED FILL — METOPROLOL TARTRATE 25 MG T: 25 | 30 days supply | Qty: 60 | Fill #0

## 2020-05-07 MED FILL — ATORVASTATIN CALCIUM 80 MG: 80 | 30 days supply | Qty: 30 | Fill #0

## 2020-05-07 MED FILL — LISINOPRIL 5 MG TABS: 5 | 30 days supply | Qty: 30 | Fill #0

## 2020-05-07 MED FILL — BRILINTA 90 MG TABLET: 90 | 30 days supply | Qty: 60 | Fill #0

## 2020-05-07 MED FILL — PENTIPS 32G X 4 MM MISC: 32G X 4 MM | 100 days supply | Qty: 100 | Fill #0

## 2020-05-07 NOTE — Progress Notes (Signed)
Progress Note  Patient Name: Jonathan Miller Date of Encounter: 05/07/2020  [facility] Cardiologist: No primary care provider on file.   Subjective   No chest pain or dyspnea.   Inpatient Medications    Scheduled Meds: . aspirin EC  81 mg Oral Daily  . atorvastatin  80 mg Oral Daily  . Chlorhexidine Gluconate Cloth  6 each Topical Daily  . heparin  5,000 Units Subcutaneous Q8H  . hydrALAZINE  10 mg Oral Q8H  . insulin aspart  0-15 Units Subcutaneous TID WC  . insulin starter kit- pen needles  1 kit Other Once  . living well with diabetes book   Does not apply Once  . mouth rinse  15 mL Mouth Rinse BID  . metoprolol tartrate  25 mg Oral BID  . pneumococcal 23 valent vaccine  0.5 mL Intramuscular Tomorrow-1000  . sodium chloride flush  3 mL Intravenous Q12H  . ticagrelor  90 mg Oral BID   Continuous Infusions:  PRN Meds: acetaminophen, morphine injection, nitroGLYCERIN, ondansetron (ZOFRAN) IV, sodium chloride flush   Vital Signs    Vitals:   05/07/20 0500 05/07/20 0600 05/07/20 0700 05/07/20 0812  BP: 103/75 132/87 121/80   Pulse: (!) 55 79 79   Resp: 17 (!) 29 (!) 21   Temp:    98.2 F (36.8 C)  TempSrc:    Oral  SpO2: 92% 90% (!) 89%   Weight:      Height:        Intake/Output Summary (Last 24 hours) at 05/07/2020 0839 Last data filed at 05/07/2020 0600 Gross per 24 hour  Intake 360 ml  Output 1050 ml  Net -690 ml   Last 3 Weights 05/07/2020 05/06/2020 05/06/2020  Weight (lbs) 200 lb 13.4 oz 207 lb 7.3 oz 195 lb  Weight (kg) 91.1 kg 94.1 kg 88.451 kg      Telemetry    Sinus rhythm without significant arrhythmia - Personally Reviewed   Physical Exam  Alert, oriented, in NAD GEN: No acute distress.   Neck: No JVD Cardiac: RRR, no murmurs, rubs, or gallops.  Respiratory: Clear to auscultation bilaterally. GI: Soft, nontender, non-distended  MS: No edema; No deformity.  Right radial site clear.  Right groin site with mild ecchymoses but no  hematoma. Neuro:  Nonfocal  Psych: Normal affect   Labs    High Sensitivity Troponin:   Recent Labs  Lab 05/06/20 0201 05/06/20 0429  TROPONINIHS 12 >27,000*      Chemistry Recent Labs  Lab 05/06/20 0201 05/06/20 0429 05/07/20 0553  NA 134* 132* 135  K 4.5 4.3 4.1  CL 102 97* 101  CO2 _0 GLUCOSE 326* 357* 229*  BUN 21 20 28*  CREATININE 1.37* 1.21 1.37*  CALCIUM 8.9 9.4 9.2  PROT 5.5*  --   --   ALBUMIN 2.9*  --   --   AST 22  --   --   ALT 24  --   --   ALKPHOS 56  --   --   BILITOT 0.9  --   --   GFRNONAA 54* >60 54*  GFRAA >60 >60 >60  ANIONGAP _1 Hematology Recent Labs  Lab 05/06/20 0201 05/06/20 0429 05/07/20 0553  WBC 11.7* 12.0* 10.8*  RBC 4.84 5.23 5.16  HGB 14.8 16.0 15.6  HCT 46.1 48.1 47.9  MCV 95.2 92.0 92.8  MCH 30.6 30.6 30.2  MCHC 32.1 33.3 32.6  RDW  12.5 12.7 13.0  PLT 257 289 285    BNPNo results for input(s): BNP, PROBNP in the last 168 hours.   DDimer No results for input(s): DDIMER in the last 168 hours.   Radiology    CARDIAC CATHETERIZATION  Result Date: 05/06/2020  Mid RCA lesion is 99% stenosed.  A drug-eluting stent was successfully placed using a STENT RESOLUTE ONYX 3.0X18.  Post intervention, there is a 0% residual stenosis.  Jonathan Miller is a 64 y.o. male  [id number] LOCATION:  FACILITY: [place] PHYSICIAN: Quay Burow, M.D. Mar 06, 1956 DATE OF PROCEDURE:  05/06/2020 DATE OF DISCHARGE: CARDIAC CATHETERIZATION / PCI DES RCA History obtained from chart review.  Jonathan Miller is a 64 year old married [place] male without prior history of CAD.  Does have a history of tobacco abuse and hypertension.  He was awoken from sleep at midnight tonight and EMS was called.  His EKG showed inferior ST segment elevation and he was transported to [facility] for urgent catheterization and intervention. PROCEDURE DESCRIPTION: The patient was brought to the second floor [place] Cardiac cath lab in the postabsorptive state. He  was not premedicated . His right wrist and groin Were prepped and shaved in usual sterile fashion. Xylocaine 1% was used for local anesthesia. A 6 French sheath was inserted into the right common femoral  artery using standard Seldinger technique.  Ultrasound guidance was used to obtain access.  Angiography revealed that his right common iliac artery was totally occluded and therefore radial access was obtained.  The patient received 4000 units  of heparin intravenously.  A 5 [place] TIG catheter and pigtail catheters were used for selective coronary angiography and obtain left heart pressures.  Isovue dye is used for the entirety of the case.  Retrograde aorta, ventricular and pullback pressures were recorded.  Radial cocktail was administered via the SideArm sheath. The patient received 180 mg of p.o. Brilinta.  He received a total of 11,000 as of heparin with an ACT of approximately 300.  Isovue dye is used for the entirety of the intervention.  Retrograde aortic pressure was monitored during the case.  Using a 6 [place] JR4 guide cath along was with an 014 Prowater guidewire and a 2 mm x 12 mm balloon the mid subtotally occluded thrombotic RCA lesion was crossed with some difficulty and predilated.  Following this a 3 mm x 18 mm long Medtronic resolute Onyx drug-eluting stent was then deployed successfully and postdilated with a 3.25 mm x 15 mm long noncompliant balloon at 14 atm.   Mr. Foerster had inferior STEMI status post PCI drug-eluting stenting of dominant RCA.  No other significant disease.  He did have a 50 to 60% proximal RCA stenosis as well.  The radial sheath was removed and a TR band was placed on the right wrist to achieve patent hemostasis.  The patient left lab stable condition.  He will need uninterrupted dual antiplatelet therapy for at least 12 months as well as risk factor modification including smoking cessation and treatment of his type 2 diabetes.  His hemoglobin A1c is 11.  2D echo will be  obtained.  He also has PAD with an occluded right iliac and some right lower extremity claudication.  I will see him back in the office to further discuss his PAD.  He will need guideline directed optimal medical therapy including high-dose statin therapy, beta-blocker heart rate permitting. Quay Burow. MD, [facility] 05/06/2020 3:55 AM   DG Chest Port 1 View  Result Date:  05/06/2020 CLINICAL DATA:  STEMI EXAM: PORTABLE CHEST 1 VIEW COMPARISON:  Radiograph 02/09/2016 FINDINGS: Chronic hyperinflation and coarsened interstitial opacities compatible with history of COPD. Some bandlike streaky and hazy opacities in the infrahilar lungs could reflect some mild atelectasis or early edema. No focal consolidative opacity is seen. No pneumothorax. Partial obscuration of the left costophrenic sulcus could reflect trace pleural fluid. The aorta is calcified. The remaining cardiomediastinal contours are unremarkable. No acute osseous or soft tissue abnormality. 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 Telemetry leads overlie the chest. IMPRESSION: 1. Chronic hyperinflation and coarsened interstitial opacities compatible with history of COPD. 2. Some bandlike streaky and hazy opacities in the infrahilar lungs could reflect some mild atelectasis or early edema. 3. 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 Electronically Signed   By: Lovena Le M.D.   On: 05/06/2020 02:14   ECHOCARDIOGRAM COMPLETE  Result Date: 05/06/2020    ECHOCARDIOGRAM REPORT   Patient Name:   Jonathan Miller Date of Exam: 05/06/2020 Medical Rec #:  [id number]  Height:       65.0 in Accession #:    [id number] Weight:       207.5 lb Date of Birth:  1956-08-22   BSA:          2.009 m Patient Age:    18 years   BP:           135/83 mmHg Patient Gender: M          HR:           66 bpm.  Exam Location:  Inpatient Procedure: 2D Echo and Intracardiac Opacification Agent Indications:    Stemi I21.3  History:        Patient has no prior history of Echocardiogram examinations.                 Risk Factors:Hypertension, Dyslipidemia, Diabetes and Current                 Smoker.  Sonographer:    Mikki Santee RDCS (AE) Referring Phys: [id number] [place]  1. Left ventricular ejection fraction, by estimation, is 45 to 50%. The left ventricle has mildly decreased function. The left ventricle demonstrates regional wall motion abnormalities (see scoring diagram/findings for description). Basal to mid inferior hypokinesis. There is mild left ventricular hypertrophy. Left ventricular diastolic parameters are consistent with Grade I diastolic dysfunction (impaired relaxation).  2. Right ventricular systolic function is normal. The right ventricular size is normal. Tricuspid regurgitation signal is inadequate for assessing PA pressure.  3. The mitral valve is normal in structure. No evidence of mitral valve regurgitation.  4. The aortic valve was not well visualized. Aortic valve regurgitation is not visualized.  5. The inferior vena cava is normal in size with greater than 50% respiratory variability, suggesting right atrial pressure of 3 mmHg. FINDINGS  Left Ventricle: Left ventricular ejection fraction, by estimation, is 45 to 50%. The left ventricle has mildly decreased function. The left ventricle demonstrates regional wall motion abnormalities. The left ventricular internal cavity size was normal in size. There is mild left ventricular hypertrophy. Left ventricular diastolic parameters are consistent with Grade I diastolic dysfunction (impaired relaxation).  LV Wall Scoring: The inferior wall is hypokinetic. The entire anterior wall, entire lateral wall, entire septum, and entire apex are normal. Right Ventricle: The right ventricular size is  normal. Right vetricular wall thickness was not  assessed. Right ventricular systolic function is normal. Tricuspid regurgitation signal is inadequate for assessing PA pressure. Left Atrium: Left atrial size was normal in size. Right Atrium: Right atrial size was normal in size. Pericardium: There is no evidence of pericardial effusion. Mitral Valve: The mitral valve is normal in structure. No evidence of mitral valve regurgitation. Tricuspid Valve: The tricuspid valve is grossly normal. Tricuspid valve regurgitation is trivial. Aortic Valve: The aortic valve was not well visualized. Aortic valve regurgitation is not visualized. Pulmonic Valve: The pulmonic valve was not well visualized. Pulmonic valve regurgitation is not visualized. Aorta: The aortic root and ascending aorta are structurally normal, with no evidence of dilitation. Venous: The inferior vena cava is normal in size with greater than 50% respiratory variability, suggesting right atrial pressure of 3 mmHg. IAS/Shunts: No atrial level shunt detected by color flow Doppler.  LEFT VENTRICLE PLAX 2D LVIDd:         4.10 cm  Diastology LVIDs:         3.10 cm  LV e' lateral:   5.93 cm/s LV PW:         1.30 cm  LV E/e' lateral: 6.5 LV IVS:        1.30 cm  LV e' medial:    4.80 cm/s LVOT diam:     2.40 cm  LV E/e' medial:  8.0 LV SV:         81 LV SV Index:   41 LVOT Area:     4.52 cm  RIGHT VENTRICLE            IVC RV S prime:     8.10 cm/s  IVC diam: 1.90 cm TAPSE (M-mode): 1.4 cm LEFT ATRIUM             Index       RIGHT ATRIUM           Index LA diam:        3.10 cm 1.54 cm/m  RA Area:     13.20 cm LA Vol (A2C):   39.7 ml 19.76 ml/m RA Volume:   31.90 ml  15.88 ml/m LA Vol (A4C):   36.1 ml 17.97 ml/m LA Biplane Vol: 39.4 ml 19.61 ml/m  AORTIC VALVE LVOT Vmax:   77.80 cm/s LVOT Vmean:  52.600 cm/s LVOT VTI:    0.180 m  AORTA Ao Root diam: 3.45 cm MITRAL VALVE MV Area (PHT): 2.42 cm    SHUNTS MV Decel Time: 313 msec    Systemic VTI:  0.18 m MV E velocity: 38.30 cm/s  Systemic Diam: 2.40 cm MV A  velocity: 71.00 cm/s MV E/A ratio:  0.54 Oswaldo Milian MD Electronically signed by Oswaldo Milian MD Signature Date/Time: 05/06/2020/2:56:06 PM    Final     Cardiac Studies   See section above  Patient Profile     64 y.o. male with uncontrolled diabetes and ongoing tobacco abuse, presenting with inferior STEMI 05/06/2020  Assessment & Plan    1.  STEMI involving the RCA: Patient status post primary PCI with a drug-eluting stent.  Treated with aspirin and ticagrelor.  High intensity statin drug initiated.  Beta-blocker was started yesterday.  Telemetry is reviewed and shows no significant bradycardia arrhythmias.  We will determine whether he is able to stay on ticagrelor or will need to transition to clopidogrel after 30 days based on his insurance status. 2.  Tobacco abuse: Cessation counseling done 3.  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  Will add low-dose lisinopril today in the setting of his LV dysfunction and diabetes.  Continue metoprolol. 4.  Type 2 diabetes, uncontrolled: Hemoglobin A1c 11.  Appreciate recommendations offered by diabetes coordinator in the context of the patient's insurance status.  Disposition: Medically stable for discharge today.  Very eager to go home.  Issues as outlined above.  He is motivated to quit smoking and says that he is done with it.  Will arrange close outpatient follow-up with Dr. Gwenlyn Found or his APP.  For questions or updates, please contact [place] Please consult [email or web address] for contact info under     Signed, Sherren Mocha, MD  05/07/2020, 8:39 AM

## 2020-05-07 NOTE — Progress Notes (Signed)
Inpatient Diabetes Program Recommendations  AACE/ADA: New Consensus Statement on Inpatient Glycemic Control (2015)  Target Ranges:  Prepandial:   less than 140 mg/dL      Peak postprandial:   less than 180 mg/dL (1-2 hours)      Critically ill patients:  140 - 180 mg/dL   Lab Results  Component Value Date   GLUCAP 218 (H) 05/07/2020   HGBA1C 11.8 (H) 05/06/2020    Review of Glycemic Control Results for Jonathan Miller, Jonathan Miller (MRN [id number]) as of 05/07/2020 11:53  Ref. Range 05/06/2020 06:38 05/06/2020 11:31 05/06/2020 15:23 05/06/2020 22:14 05/07/2020 06:04  Glucose-Capillary Latest Ref Range: 70 - 99 mg/dL 347 (H) 210 (H) 207 (H) 161 (H) 218 (H)   Inpatient Diabetes Program Recommendations:   Spoke with pt and wife about A1C results 11.8 (average blood glucose 292) and explained what an A1C is, basic pathophysiology of DM Type 2, basic home care, basic diabetes diet nutrition principles, importance of checking CBGs and maintaining good CBG control to prevent long-term and short-term complications. Reviewed signs and symptoms of hyperglycemia and hypoglycemia and how to treat hypoglycemia at home. Also reviewed blood sugar goals at home.  Educated patient and spouse on insulin pen use at home. Reviewed contents of insulin flexpen starter kit. Reviewed all steps if insulin pen including attachment of needle, 2-unit air shot, dialing up dose, giving injection, removing needle, disposal of sharps, storage of unused insulin, disposal of insulin etc. Patient able to provide successful return demonstration. Also reviewed troubleshooting with insulin pen. MD to give patient Rxs for insulin pens and insulin pen needles.  Patient states he plans to start cooking @ home and making changes in his diet plans including taking his meals to work.. Patient drinks mostly water. Requested patient to check CBGs @ least bid and take list of CBGs or meter with him to appointments.  Thank you, Nani Gasser. Sandee Bernath, RN, MSN, CDE   Diabetes Coordinator Inpatient Glycemic Control Team Team Pager [phone] (8am-5pm) 05/07/2020 12:00 PM

## 2020-05-07 NOTE — Progress Notes (Signed)
CARDIAC REHAB PHASE I   PRE:  Rate/Rhythm: 70 SR  BP:  Supine:   Sitting: 130/80  Standing:    SaO2:   MODE:  Ambulation: 540 ft   POST:  Rate/Rhythm: 88 SR  BP:  Supine:   Sitting: 137/80  Standing:    SaO2: 98%RA 0900-0920 Pt walked 540 ft on RA with steady gait and tolerated well. No CP. No questions re ed done yesterday.   Luetta Nutting, RN BSN  05/07/2020 9:15 AM

## 2020-05-07 NOTE — Telephone Encounter (Signed)
    Attention TOC pool,  This patient will need a TOC phone call after discharge. They are being discharged today. Follow-up appointment has already been arranged with: Marjie Skiff 8/30 They are a patient of Nanetta Batty, MD.  Thank you! Laurann Montana, PA-C

## 2020-05-07 NOTE — Discharge Summary (Addendum)
Discharge Summary    Patient ID: Jonathan Miller MRN: [id number]; DOB: 09-08-1956  Admit date: 05/06/2020 Discharge date: 05/07/2020  Primary Care Provider: No primary care provider on file.  Primary Cardiologist: Quay Burow, MD  Primary Electrophysiologist:  None   Discharge Diagnoses    Active Problems:   Acute ST elevation myocardial infarction (STEMI) involving right coronary artery [facility])   Ischemic cardiomyopathy   Acute combined systolic and diastolic heart failure ([place])   Essential hypertension   AKI (acute kidney injury) ([place])   Hyperlipidemia LDL goal <70   Uncontrolled diabetes mellitus ([place])   Tobacco abuse   PAD (peripheral artery disease) [facility])   Diagnostic Studies/Procedures    [facility] 05/06/20 CARDIAC CATHETERIZATION / PCI DES RCA   History obtained from chart review.  Jonathan Miller is a 64 year old married [place] male without prior history of CAD.  Does have a history of tobacco abuse and hypertension.  He was awoken from sleep at midnight tonight and EMS was called.  His EKG showed inferior ST segment elevation and he was transported to [facility] for urgent catheterization and intervention.   PROCEDURE DESCRIPTION:   The patient was brought to the second floor [place] [facility]iac cath lab in the postabsorptive state. He was not premedicated . His right wrist and groin Were prepped and shaved in usual sterile fashion. Xylocaine 1% was used for local anesthesia. A 6 French sheath was inserted into the right common femoral  artery using standard Seldinger technique.  Ultrasound guidance was used to obtain access.  Angiography revealed that his right common iliac artery was totally occluded and therefore radial access was obtained.  The patient received 4000 units  of heparin intravenously.  A 5 [place] TIG catheter and pigtail catheters were used for selective coronary angiography and obtain left heart pressures.  Isovue dye is used for the entirety of the case.   Retrograde aorta, ventricular and pullback pressures were recorded.  Radial cocktail was administered via the SideArm sheath.  The patient received 180 mg of p.o. Brilinta.  He received a total of 11,000 as of heparin with an ACT of approximately 300.  Isovue dye is used for the entirety of the intervention.  Retrograde aortic pressure was monitored during the case.  Using a 6 [place] JR4 guide cath along was with an 014 Prowater guidewire and a 2 mm x 12 mm balloon the mid subtotally occluded thrombotic RCA lesion was crossed with some difficulty and predilated.  Following this a 3 mm x 18 mm long Medtronic resolute Onyx drug-eluting stent was then deployed successfully and postdilated with a 3.25 mm x 15 mm long noncompliant balloon at 14 atm.   IMPRESSION: Jonathan Miller had inferior STEMI status post PCI drug-eluting stenting of dominant RCA.  No other significant disease.  He did have a 50 to 60% proximal RCA stenosis as well.  The radial sheath was removed and a TR band was placed on the right wrist to achieve patent hemostasis.  The patient left lab stable condition.  He will need uninterrupted dual antiplatelet therapy for at least 12 months as well as risk factor modification including smoking cessation and treatment of his type 2 diabetes.  His hemoglobin A1c is 11.  2D echo will be obtained.  He also has PAD with an occluded right iliac and some right lower extremity claudication.  I will see him back in the office to further discuss his PAD.  He will need guideline directed optimal medical therapy including  high-dose statin therapy, beta-blocker heart rate permitting.  Quay Burow. MD, [facility] 05/06/2020 3:55 AM  2D Echo 05/06/20 1. Left ventricular ejection fraction, by estimation, is 45 to 50%. The  left ventricle has mildly decreased function. The left ventricle  demonstrates regional wall motion abnormalities (see scoring  diagram/findings for description). Basal to mid  inferior  hypokinesis. There is mild left ventricular hypertrophy. Left  ventricular diastolic parameters are consistent with Grade I diastolic  dysfunction (impaired relaxation).  2. Right ventricular systolic function is normal. The right ventricular  size is normal. Tricuspid regurgitation signal is inadequate for assessing  PA pressure.  3. The mitral valve is normal in structure. No evidence of mitral valve  regurgitation.  4. The aortic valve was not well visualized. Aortic valve regurgitation  is not visualized.  5. The inferior vena cava is normal in size with greater than 50%  respiratory variability, suggesting right atrial pressure of 3 mmHg.    _____________   History of Present Illness     Jonathan Miller is a 64 y.o. male with HTN, DM, tobacco abuse, hiatal hernia, obesity, GERD who presented to [facility] with STEMI. He was awakened from sleep around midnight the day of admission with substernal chest pain with radiation to left arm and jaw.  He took 324 mg of aspirin and called 911.  On EMS arrival, EKG showed sinus bradycardia in the 40s with ST elevations in leads II, III, and aVF.  Blood pressure was 80s over 40s on EMS arrival.  When he reached the emergency department, he blood pressure was 160s over 90s with heart rate in the 70s and persistent inferior ST elevations.  Chest pain had improved, but arm and jaw pain persisted. Covid negative. He was transported emergently to the cath lab.  Hospital Course     1. Inferior STEMI s/p PCI on 05/06/2020 - In the cath lab he was found to have acute occlusion of mid RCA.  He received PCI with a 3 mm x 18 mm Onyx drug-eluting stent via right radial arterial access.  He received ticagrelor load in the Cath Lab.  Right femoral arterial access was initially attempted but he was found to have occluded right iliac artery - troponin peak >27k - was placed on DAPT with ASA/Brilinta for at least 1 year - statin initiated along with beta  blocker/ACEi thereapy - LVEF as below  2. Ischemic cardiomyopathy EF 45-50% by echo with acute combined CHF - LVEDP 37mHg in the lab, s/p Lasix - 2D echo with EF 45-50%, grae 1 DD - started on BB, ACEi - not felt to require standing diuretic at discharge - recommend obtaining BMET at f/u OV - reviewed CHF warning sx with patient  3. Hypertension - managed in context of the above with addition of lisinopril, metoprolol  4.  AKI versus CKD stage II (present on admission) - Creatinine in 2019 was 0.7-0.8.  Creatinine on admit was 1.37->1.21->1.37, unclear if this is AKI in the setting of MI or if he has had progression of CKD in the past 2 years.  - will need f/u BMET at outpatient appointment given initiation of ACEi, also establish new baseline  5.  Hyperlipidemia - LDL is 121 and total cholesterol 229 - stated on atorvastatin - If the patient is tolerating statin at time of follow-up appointment, would consider rechecking liver function/lipid panel in 6 weeks  6.  Type 2 diabetes, uncontrolled, A1C 11.8  - he was on Lantus while  inpatient, but given self-pay diabetes coordinator has recommended Relion Novolin 70/30 insulin 8 units bid ac meals - he says he has diabetic supplies and glucometer at home - care management has arranged OP primary care follow-up  7. Tobacco abuse - patient motivated for cessation  8. PAD - found to have occluded right iliac by cath and some right lower extremity claudication, would recommend arranging PV f/u with Dr. Gwenlyn Found at follow-up visit to address  Meds sent to [facility] program with final med list and instructions outlined below. We will determine whether he is able to stay on ticagrelor or will need to transition to clopidogrel after 30 days based on his insurance status at follow-up. Dr. Burt Knack has seen and examined the patient today and feels he is stable for discharge. He will remain out of work until assessed in follow-up. Instructions/meds as  outlined below.  Did the patient have an acute coronary syndrome (MI, NSTEMI, STEMI, etc) this admission?:  Yes                               AHA/ACC Clinical Performance & Quality Measures: 1. Aspirin prescribed? - Yes 2. ADP Receptor Inhibitor (Plavix/Clopidogrel, Brilinta/Ticagrelor or Effient/Prasugrel) prescribed (includes medically managed patients)? - Yes 3. Beta Blocker prescribed? - Yes 4. High Intensity Statin (Lipitor 40-21m or Crestor 20-46m prescribed? - Yes 5. EF assessed during THIS hospitalization? - Yes 6. For EF <40%, was ACEI/ARB prescribed? - Yes 7. For EF <40%, Aldosterone Antagonist (Spironolactone or Eplerenone) prescribed? - Not Applicable (EF >/= 4003%8. Cardiac Rehab Phase II ordered (including medically managed patients)? - Yes   _____________  Discharge Vitals Blood pressure 126/75, pulse 63, temperature 98.2 F (36.8 C), temperature source Oral, resp. rate 17, height '5\' 5"'  (1.651 m), weight 91.1 kg, SpO2 92 %.  Filed Weights   05/06/20 0154 05/06/20 0430 05/07/20 0317  Weight: 88.5 kg 94.1 kg 91.1 kg    Labs & Radiologic Studies    CBC Recent Labs    05/06/20 0201 05/06/20 0201 05/06/20 0429 05/07/20 0553  WBC 11.7*   < > 12.0* 10.8*  NEUTROABS 8.9*  --   --   --   HGB 14.8   < > 16.0 15.6  HCT 46.1   < > 48.1 47.9  MCV 95.2   < > 92.0 92.8  PLT 257   < > 289 285   < > = values in this interval not displayed.   Basic Metabolic Panel Recent Labs    05/06/20 0429 05/07/20 0553  NA 132* 135  K 4.3 4.1  CL 97* 101  CO2 23 22  GLUCOSE 357* 229*  BUN 20 28*  CREATININE 1.21 1.37*  CALCIUM 9.4 9.2   Liver Function Tests Recent Labs    05/06/20 0201  AST 22  ALT 24  ALKPHOS 56  BILITOT 0.9  PROT 5.5*  ALBUMIN 2.9*   No results for input(s): LIPASE, AMYLASE in the last 72 hours. High Sensitivity Troponin:   Recent Labs  Lab 05/06/20 0201 05/06/20 0429  TROPONINIHS 12 >27,000*    BNP Invalid input(s): POCBNP D-Dimer No  results for input(s): DDIMER in the last 72 hours. Hemoglobin A1C Recent Labs    05/06/20 0201  HGBA1C 11.8*   Fasting Lipid Panel Recent Labs    05/06/20 0201  CHOL 229*  HDL 48  LDLCALC 121*  TRIG 298*  CHOLHDL 4.8   Thyroid Function Tests No  results for input(s): TSH, T4TOTAL, T3FREE, THYROIDAB in the last 72 hours.  Invalid input(s): FREET3 _____________  CARDIAC CATHETERIZATION  Result Date: 05/06/2020  Mid RCA lesion is 99% stenosed.  A drug-eluting stent was successfully placed using a STENT RESOLUTE ONYX 3.0X18.  Post intervention, there is a 0% residual stenosis.  Jonathan Miller is a 64 y.o. male  [id number] LOCATION:  FACILITY: [place] PHYSICIAN: Quay Burow, M.D. 09/09/1956 DATE OF PROCEDURE:  05/06/2020 DATE OF DISCHARGE: CARDIAC CATHETERIZATION / PCI DES RCA History obtained from chart review.  Jonathan Miller is a 64 year old married [place] male without prior history of CAD.  Does have a history of tobacco abuse and hypertension.  He was awoken from sleep at midnight tonight and EMS was called.  His EKG showed inferior ST segment elevation and he was transported to [facility] for urgent catheterization and intervention. PROCEDURE DESCRIPTION: The patient was brought to the second floor [place] Cardiac cath lab in the postabsorptive state. He was not premedicated . His right wrist and groin Were prepped and shaved in usual sterile fashion. Xylocaine 1% was used for local anesthesia. A 6 French sheath was inserted into the right common femoral  artery using standard Seldinger technique.  Ultrasound guidance was used to obtain access.  Angiography revealed that his right common iliac artery was totally occluded and therefore radial access was obtained.  The patient received 4000 units  of heparin intravenously.  A 5 [place] TIG catheter and pigtail catheters were used for selective coronary angiography and obtain left heart pressures.  Isovue dye is used for the entirety of the  case.  Retrograde aorta, ventricular and pullback pressures were recorded.  Radial cocktail was administered via the SideArm sheath. The patient received 180 mg of p.o. Brilinta.  He received a total of 11,000 as of heparin with an ACT of approximately 300.  Isovue dye is used for the entirety of the intervention.  Retrograde aortic pressure was monitored during the case.  Using a 6 [place] JR4 guide cath along was with an 014 Prowater guidewire and a 2 mm x 12 mm balloon the mid subtotally occluded thrombotic RCA lesion was crossed with some difficulty and predilated.  Following this a 3 mm x 18 mm long Medtronic resolute Onyx drug-eluting stent was then deployed successfully and postdilated with a 3.25 mm x 15 mm long noncompliant balloon at 14 atm.   Mr. Pauwels had inferior STEMI status post PCI drug-eluting stenting of dominant RCA.  No other significant disease.  He did have a 50 to 60% proximal RCA stenosis as well.  The radial sheath was removed and a TR band was placed on the right wrist to achieve patent hemostasis.  The patient left lab stable condition.  He will need uninterrupted dual antiplatelet therapy for at least 12 months as well as risk factor modification including smoking cessation and treatment of his type 2 diabetes.  His hemoglobin A1c is 11.  2D echo will be obtained.  He also has PAD with an occluded right iliac and some right lower extremity claudication.  I will see him back in the office to further discuss his PAD.  He will need guideline directed optimal medical therapy including high-dose statin therapy, beta-blocker heart rate permitting. Quay Burow. MD, [facility] 05/06/2020 3:55 AM   DG Chest Port 1 View  Result Date: 05/06/2020 CLINICAL DATA:  STEMI EXAM: PORTABLE CHEST 1 VIEW COMPARISON:  Radiograph 02/09/2016 FINDINGS: Chronic hyperinflation and coarsened interstitial opacities compatible with history  of COPD. Some bandlike streaky and hazy opacities in the infrahilar lungs  could reflect some mild atelectasis or early edema. No focal consolidative opacity is seen. No pneumothorax. Partial obscuration of the left costophrenic sulcus could reflect trace pleural fluid. The aorta is calcified. The remaining cardiomediastinal contours are unremarkable. No acute osseous or soft tissue abnormality. 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 Telemetry leads overlie the chest. IMPRESSION: 1. Chronic hyperinflation and coarsened interstitial opacities compatible with history of COPD. 2. Some bandlike streaky and hazy opacities in the infrahilar lungs could reflect some mild atelectasis or early edema. 3. 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 Electronically Signed   By: Lovena Le M.D.   On: 05/06/2020 02:14   ECHOCARDIOGRAM COMPLETE  Result Date: 05/06/2020    ECHOCARDIOGRAM REPORT   Patient Name:   Jonathan Miller Date of Exam: 05/06/2020 Medical Rec #:  [id number]  Height:       65.0 in Accession #:    [id number] Weight:       207.5 lb Date of Birth:  10-25-1955   BSA:          2.009 m Patient Age:    84 years   BP:           135/83 mmHg Patient Gender: M          HR:           66 bpm. Exam Location:  Inpatient Procedure: 2D Echo and Intracardiac Opacification Agent Indications:    Stemi I21.3  History:        Patient has no prior history of Echocardiogram examinations.                 Risk Factors:Hypertension, Dyslipidemia, Diabetes and Current                 Smoker.  Sonographer:    Mikki Santee RDCS (AE) Referring Phys: [id number] [place]  1. Left ventricular ejection fraction, by estimation, is 45 to 50%. The left ventricle has mildly decreased function. The left ventricle demonstrates regional wall motion abnormalities (see scoring diagram/findings for description). Basal to mid inferior  hypokinesis. There is mild left ventricular hypertrophy. Left ventricular diastolic parameters are consistent with Grade I diastolic dysfunction (impaired relaxation).  2. Right ventricular systolic function is normal. The right ventricular size is normal. Tricuspid regurgitation signal is inadequate for assessing PA pressure.  3. The mitral valve is normal in structure. No evidence of mitral valve regurgitation.  4. The aortic valve was not well visualized. Aortic valve regurgitation is not visualized.  5. The inferior vena cava is normal in size with greater than 50% respiratory variability, suggesting right atrial pressure of 3 mmHg. FINDINGS  Left Ventricle: Left ventricular ejection fraction, by estimation, is 45 to 50%. The left ventricle has mildly decreased function. The left ventricle demonstrates regional wall motion abnormalities. The left ventricular internal cavity size was normal in size. There is mild left ventricular hypertrophy. Left ventricular diastolic parameters are consistent with Grade I diastolic dysfunction (impaired relaxation).  LV Wall Scoring: The inferior wall is hypokinetic. The entire anterior wall, entire lateral wall, entire septum, and entire apex are normal. Right Ventricle: The right ventricular size is normal. Right vetricular wall thickness was not assessed. Right ventricular systolic function is normal. Tricuspid regurgitation signal is inadequate for assessing PA pressure.  Left Atrium: Left atrial size was normal in size. Right Atrium: Right atrial size was normal in size. Pericardium: There is no evidence of pericardial effusion. Mitral Valve: The mitral valve is normal in structure. No evidence of mitral valve regurgitation. Tricuspid Valve: The tricuspid valve is grossly normal. Tricuspid valve regurgitation is trivial. Aortic Valve: The aortic valve was not well visualized. Aortic valve regurgitation is not visualized. Pulmonic Valve: The pulmonic valve was not well  visualized. Pulmonic valve regurgitation is not visualized. Aorta: The aortic root and ascending aorta are structurally normal, with no evidence of dilitation. Venous: The inferior vena cava is normal in size with greater than 50% respiratory variability, suggesting right atrial pressure of 3 mmHg. IAS/Shunts: No atrial level shunt detected by color flow Doppler.  LEFT VENTRICLE PLAX 2D LVIDd:         4.10 cm  Diastology LVIDs:         3.10 cm  LV e' lateral:   5.93 cm/s LV PW:         1.30 cm  LV E/e' lateral: 6.5 LV IVS:        1.30 cm  LV e' medial:    4.80 cm/s LVOT diam:     2.40 cm  LV E/e' medial:  8.0 LV SV:         81 LV SV Index:   41 LVOT Area:     4.52 cm  RIGHT VENTRICLE            IVC RV S prime:     8.10 cm/s  IVC diam: 1.90 cm TAPSE (M-mode): 1.4 cm LEFT ATRIUM             Index       RIGHT ATRIUM           Index LA diam:        3.10 cm 1.54 cm/m  RA Area:     13.20 cm LA Vol (A2C):   39.7 ml 19.76 ml/m RA Volume:   31.90 ml  15.88 ml/m LA Vol (A4C):   36.1 ml 17.97 ml/m LA Biplane Vol: 39.4 ml 19.61 ml/m  AORTIC VALVE LVOT Vmax:   77.80 cm/s LVOT Vmean:  52.600 cm/s LVOT VTI:    0.180 m  AORTA Ao Root diam: 3.45 cm MITRAL VALVE MV Area (PHT): 2.42 cm    SHUNTS MV Decel Time: 313 msec    Systemic VTI:  0.18 m MV E velocity: 38.30 cm/s  Systemic Diam: 2.40 cm MV A velocity: 71.00 cm/s MV E/A ratio:  0.54 Jonathan Milian MD Electronically signed by Jonathan Milian MD Signature Date/Time: 05/06/2020/2:56:06 PM    Final    Disposition   Pt is being discharged home today in good condition.  Follow-up Plans & Appointments     Follow-up Information    [facility]. Go on 06/10/2020.   Why: You have been set up with a hospital followup appointment for primary care physician at this clinic on September, 21, 2021 at 2:30 pm. Contact information: [place] [address] [phone]       Darreld Mclean, PA-C  Follow up.   Specialties: Physician Assistant, Cardiology Why: [facility] location - a follow-up has been made for you on Monday May 19, 2020 11:15 AM (Arrive by 11:00 AM). Callie is one of the PAs that works closely with our cardiology team. Contact information: [address] [place] [address]  [phone]              Discharge Instructions    Amb Referral to Cardiac Rehabilitation   Complete by: As directed    Diagnosis:  Coronary Stents STEMI     After initial evaluation and assessments completed: Virtual Based Care may be provided alone or in conjunction with Phase 2 Cardiac Rehab based on patient barriers.: Yes   Diet - low sodium heart healthy   Complete by: As directed    Discharge instructions   Complete by: As directed    One of your heart tests showed weakness of the heart muscle this admission. This may make you more susceptible to weight gain from fluid retention, which can lead to symptoms that we call heart failure. Please follow these special instructions:  1. Follow a low-salt diet - you are allowed no more than 2,020m of sodium per day. Watch your fluid intake. In general, you should not be taking in more than 2 liters of fluid per day (no more than 8 glasses per day). This includes sources of water in foods like soup, coffee, tea, milk, etc. 2. Weigh yourself on the same scale at same time of day and keep a log. 3. Call your doctor: (Anytime you feel any of the following symptoms)  - 3lb weight gain overnight or 5lb within a few days - Shortness of breath, with or without a dry hacking cough  - Swelling in the hands, feet or stomach  - If you have to sleep on extra pillows at night in order to breathe   IT IS IMPORTANT TO LET YOUR DOCTOR KNOW EARLY ON IF YOU ARE HAVING SYMPTOMS SO WE CAN HELP YOU!  If you notice any bleeding such as blood in stool, black tarry stools, blood in urine, nosebleeds or any other unusual bleeding,  call your doctor immediately. It is not normal to have this kind of bleeding while on a blood thinner and usually indicates there is an underlying problem with one of your body systems that needs to be checked out.  Please check your blood sugar 4 times per day - fasting when you wake up, at lunch, at dinner and at bedtime. Keep a log of these readings.   Increase activity slowly   Complete by: As directed    No driving for 2 weeks. No lifting over 10 lbs for 4 weeks. No sexual activity for 4 weeks. You may not return to work until cleared by your cardiologist. Keep procedure site clean & dry. If you notice increased pain, swelling, bleeding or pus, call/return!  You may shower, but no soaking baths/hot tubs/pools for 1 week.      Discharge Medications   Allergies as of 05/07/2020   No Known Allergies     Medication List    TAKE these medications   aspirin EC 81 MG tablet Take 1 tablet (81 mg total) by mouth daily. Swallow whole.   atorvastatin 80 MG tablet Commonly known as: LIPITOR Take 1 tablet (80 mg total) by mouth daily. Start taking on: May 08, 2020   lisinopril 5 MG tablet Commonly known as: ZESTRIL Take 1 tablet (5 mg total) by mouth daily. Start taking on: May 08, 2020   metoprolol tartrate 25 MG tablet Commonly known as: LOPRESSOR Take 1 tablet (25 mg total) by mouth 2 (two) times daily.   nitroGLYCERIN 0.4 MG SL tablet Commonly known as: NITROSTAT Place 1 tablet (0.4 mg total) under the tongue every 5 (five)  minutes as needed for chest pain (up to 3 doses max).   NovoLIN 70/30 (70-30) 100 UNIT/ML injection Generic drug: insulin NPH-regular Human Inject 8 Units into the skin 2 (two) times daily with a meal.   Pen Needles 32G X 4 MM Misc 1 each by Does not apply route 2 (two) times daily.   ticagrelor 90 MG Tabs tablet Commonly known as: BRILINTA Take 1 tablet (90 mg total) by mouth 2 (two) times daily.          Outstanding Labs/Studies    Recommend BMET at f/u and CMET/Lipid profile in about 6 weeks to be arranged at f/u OV  Duration of Discharge Encounter   Greater than 30 minutes including physician time.  Signed, Charlie Pitter, PA-C 05/07/2020, 11:11 AM

## 2020-05-07 NOTE — Discharge Instructions (Signed)
Information about your medication: Brilinta (anti-platelet agent)  Generic Name (Brand): ticagrelor (Brilinta), twice daily medication  PURPOSE: You are taking this medication along with aspirin to lower your chance of having a heart attack, stroke, or blood clots in your heart stent. These can be fatal. Brilinta and aspirin help prevent platelets from sticking together and forming a clot that can block an artery or your stent.   Common SIDE EFFECTS you may experience include: bruising or bleeding more easily, shortness of breath  Do not stop taking BRILINTA without talking to the doctor who prescribes it for you. People who are treated with a stent and stop taking Brilinta too soon, have a higher risk of getting a blood clot in the stent, having a heart attack, or dying. If you stop Brilinta because of bleeding, or for other reasons, your risk of a heart attack or stroke may increase.   Avoid taking NSAID agents or anti-inflammatory medications such as ibuprofen, naproxen given increased bleed risk with plavix - can use acetaminophen (Tylenol) if needed for pain.  Tell all of your doctors and dentists that you are taking Brilinta. They should talk to the doctor who prescribed Brilinta for you before you have any surgery or invasive procedure.   Contact your health care provider if you experience: severe or uncontrollable bleeding, pink/red/brown urine, vomiting blood or vomit that looks like "coffee grounds", red or black stools (looks like tar), coughing up blood or blood clots ---------------------------------------------------------------------------------------------------------------------- Information about your medication:  Beta Blocker (Heart rate/Blood Pressure-lowering agent)  Generic Name (Brand): metoprolol tartrate (Lopressor)  PURPOSE: You are taking this medication to lower blood pressure and heart rate to help prevent further damage to your heart and to reduce your risk of death  following your cardiac event.   Common SIDE EFFECTS you may experience include: fatigue, dizziness, diarrhea, or nausea. This medication may also mask the signs of hypoglycemia  Take your medication exactly as prescribed.   Contact your health care provider if you experience: severely lower bllod pressure, syncope, palpitations, or chest pain muscle ----------------------------------------------------------------------------------------------------------------------

## 2020-05-07 NOTE — Progress Notes (Signed)
Patient discharged home with spouse.  All questions anwered and support given.  Meds per TOC given to patient.

## 2020-05-08 NOTE — Telephone Encounter (Signed)
Patient contacted regarding discharge from [facility] on 05/07/2020.  Patient understands to follow up with provider Marjie Skiff PA on 05/19/20 at 11:15 AM at [facility]. Patient understands discharge instructions? yes Patient understands medications and regiment? yes Patient understands to bring all medications to this visit? yes  Patient was unsure how much insulin he is suppose to be taking.  He has the pen and the needles. He is aware of how to use this.  Advised per discharge summary he is to take 8 units BID with meals.    He verbalized understanding.  No further questions.

## 2020-05-12 ENCOUNTER — Encounter: Payer: Self-pay | Admitting: Cardiology

## 2020-05-15 NOTE — Progress Notes (Signed)
Cardiology Office Note:    Date:  05/19/2020   ID:  Jonathan Miller, DOB 1955-10-28, MRN [id number]  PCP:  Hoy Register, MD  Cardiologist:  Nanetta Batty, MD  Electrophysiologist:  None   Referring MD: No ref. provider found   Chief Complaint: hospital follow-up for STEMI  History of Present Illness:    Jonathan Miller is a 64 y.o. male with a history of CAD with recent inferior STEMI on 05/06/2020 s/p DES to mid RCA, hyeprtension, diabetes mellitus, hiatal hernia, GERD, obesity, and tobacco abuse who is followed by Dr. Allyson Sabal and presents today for hospital follow-up after recent STEMI.  Patient admitted on 05/06/2020 for acute inferior STEMI after presenting with substernal chest pain with radiation to left arm and jaw that woke him up from sleep. High-sensitivity troponin peaked at >27,000. Cardiac catheterization showed 99% stenosis of mid RCA (otherwise clear coronaries). Patient underwent successful PCI with DES to RCA lesion. Echo showed LVEF of 45-50% with basal to mid inferior hypokinesis and grade 1 diastolic dysfunction. Started on DAPT with Aspirin and Brilinta as well Lipitor, Lopressor, and Lisinopril. Of note, patient found to have occluded right iliac during cath and noted some right lower extremity claudication. Follow-up with Dr. Allyson Sabal in [facility] clinic recommended.  Patient presents today for follow-up. Patient has done relatively well since discharge. He reports some chest discomfort that he reports feels like "gas" on his chest similar to prior reflux symptoms. He also reports some pain in bilateral shoulder and neck when ever he lifts anything even just 2-3 lbs as well as pain in his left should/arm when he sleeps on that side. However, this has been going on for a long time - long before his MI. This does not sound cardiac in nature. No other exertional symptoms and no recurrent pain similar to presentation prior to MI. He denies any dyspnea, orthopnea, or PND. He has been walking for  5 minutes at a times. No chest pain or significant shortness of breath with this but does report feeling weak. He has been compliant with all medications and is tolerating them well. No abnormal bleeding with DAPT. He notes some cramping in his legs at night but denies any calf pain when walking. No lower extremity edema.  Past Medical History:  Diagnosis Date   Arthritis    "right shoulder" (02/09/2016)   Bell's palsy 01/2012; 02/09/2016   CAD (coronary artery disease)    a. inferior STEMI 04/2020 s/p DES to mRCA, EF 45-50% by echo.   Daily headache    "recently" (02/09/2016)   GERD (gastroesophageal reflux disease)    Hiatal hernia    History of hiatal hernia    HTN (hypertension)    Hyperlipidemia    Hypertension    Ischemic cardiomyopathy    Obesity    PAD (peripheral artery disease) (HCC)    Renal insufficiency    noted 04/2020 admit   STEMI (ST elevation myocardial infarction) (HCC)    Tobacco abuse    Tobacco use    Type 2 diabetes mellitus (HCC)    Type II diabetes mellitus (HCC)     Past Surgical History:  Procedure Laterality Date   CORONARY/GRAFT ACUTE MI REVASCULARIZATION N/A 05/06/2020   Procedure: Coronary/Graft Acute MI Revascularization;  Surgeon: Runell Gess, MD;  Location: [facility] INVASIVE CV LAB;  Service: Cardiovascular;  Laterality: N/A;   LEFT HEART CATH AND CORONARY ANGIOGRAPHY N/A 05/06/2020   Procedure: LEFT HEART CATH AND CORONARY ANGIOGRAPHY;  Surgeon: Nanetta Batty  J, MD;  Location: [facility] INVASIVE CV LAB;  Service: Cardiovascular;  Laterality: N/A;   NO PAST SURGERIES      Current Medications: Current Meds  Medication Sig   aspirin EC 81 MG tablet Take 1 tablet (81 mg total) by mouth daily. Swallow whole.   atorvastatin (LIPITOR) 80 MG tablet Take 1 tablet (80 mg total) by mouth daily.   insulin NPH-regular Human (NOVOLIN 70/30) (70-30) 100 UNIT/ML injection Inject 8 Units into the skin 2 (two) times daily with a meal.    Insulin Pen Needle (PEN NEEDLES) 32G X 4 MM MISC 1 each by Does not apply route 2 (two) times daily.   lisinopril (ZESTRIL) 5 MG tablet Take 1 tablet (5 mg total) by mouth daily.   metoprolol tartrate (LOPRESSOR) 25 MG tablet Take 1 tablet (25 mg total) by mouth 2 (two) times daily.   nitroGLYCERIN (NITROSTAT) 0.4 MG SL tablet Place 1 tablet (0.4 mg total) under the tongue every 5 (five) minutes as needed for chest pain (up to 3 doses max).   ticagrelor (BRILINTA) 90 MG TABS tablet Take 1 tablet (90 mg total) by mouth 2 (two) times daily.   [DISCONTINUED] atorvastatin (LIPITOR) 40 MG tablet Take 1 tablet (40 mg total) by mouth daily at 6 PM.   [DISCONTINUED] insulin NPH-regular Human (NOVOLIN 70/30) (70-30) 100 UNIT/ML injection Inject 8 Units into the skin 2 (two) times daily with a meal.   [DISCONTINUED] lisinopril (PRINIVIL) 10 MG tablet Take 1 tablet (10 mg total) by mouth daily.   [DISCONTINUED] terbinafine (LAMISIL AT) 1 % cream Apply 1 application topically 2 (two) times daily.     Allergies:   Patient has no known allergies.   Social History   Socioeconomic History   Marital status: Married    Spouse name: Not on file   Number of children: Not on file   Years of education: Not on file   Highest education level: Not on file  Occupational History   Not on file  Tobacco Use   Smoking status: Former Smoker    Quit date: 05/05/2020    Years since quitting: 0.0   Smokeless tobacco: Never Used   Tobacco comment: 02/09/2016 "chewed from age [age]"  Substance and Sexual Activity   Alcohol use: Yes    Comment: every 2-3 weeks   Drug use: Never   Sexual activity: Yes  Other Topics Concern   Not on file  Social History Narrative   ** Merged History Encounter **       Social Determinants of Health   Financial Resource Strain:    Difficulty of Paying Living Expenses: Not on file  Food Insecurity:    Worried About Programme researcher, broadcasting/film/video in the Last Year: Not on  file   [employer] of Food in the Last Year: Not on file  Transportation Needs:    Lack of Transportation (Medical): Not on file   Lack of Transportation (Non-Medical): Not on file  Physical Activity:    Days of Exercise per Week: Not on file   Minutes of Exercise per Session: Not on file  Stress:    Feeling of Stress : Not on file  Social Connections:    Frequency of Communication with Friends and Family: Not on file   Frequency of Social Gatherings with Friends and Family: Not on file   Attends Religious Services: Not on file   Active Member of Clubs or Organizations: Not on file   Attends Banker Meetings:  Not on file   Marital Status: Not on file     Family History: The patient's Family history is unknown by patient.  ROS:   Please see the history of present illness.      EKGs/Labs/Other Studies Reviewed:    The following studies were reviewed today:  Left Heart Catheterization 05/06/2020:  Mid RCA lesion is 99% stenosed.  A drug-eluting stent was successfully placed using a STENT RESOLUTE ONYX 3.0X18.  Post intervention, there is a 0% residual stenosis.  Impressions: Mr. Clairmont had inferior STEMI status post PCI drug-eluting stenting of dominant RCA.  No other significant disease.  He did have a 50 to 60% proximal RCA stenosis as well.  The radial sheath was removed and a TR band was placed on the right wrist to achieve patent hemostasis.  The patient left lab stable condition.  He will need uninterrupted dual antiplatelet therapy for at least 12 months as well as risk factor modification including smoking cessation and treatment of his type 2 diabetes.  His hemoglobin A1c is 11.  2D echo will be obtained.  He also has PAD with an occluded right iliac and some right lower extremity claudication.  I will see him back in the office to further discuss his PAD.  He will need guideline directed optimal medical therapy including high-dose statin therapy,  beta-blocker heart rate permitting. _______________  Echocardiogram 05/06/2020: Impressions: 1. Left ventricular ejection fraction, by estimation, is 45 to 50%. The  left ventricle has mildly decreased function. The left ventricle  demonstrates regional wall motion abnormalities (see scoring  diagram/findings for description). Basal to mid  inferior hypokinesis. There is mild left ventricular hypertrophy. Left  ventricular diastolic parameters are consistent with Grade I diastolic  dysfunction (impaired relaxation).  2. Right ventricular systolic function is normal. The right ventricular  size is normal. Tricuspid regurgitation signal is inadequate for assessing  PA pressure.  3. The mitral valve is normal in structure. No evidence of mitral valve  regurgitation.  4. The aortic valve was not well visualized. Aortic valve regurgitation  is not visualized.  5. The inferior vena cava is normal in size with greater than 50%  respiratory variability, suggesting right atrial pressure of 3 mmHg.    EKG:  EKG ordered today. EKG personally reviewed and demonstrates normal sinus rhythm, rate 70 bpm, with Q waves in inferior leads and evolving ST /T changes in lead II. No other acute ischemic changes.  Recent Labs: 05/06/2020: ALT 24 05/07/2020: BUN 28; Creatinine, Ser 1.37; Hemoglobin 15.6; Platelets 285; Potassium 4.1; Sodium 135  Recent Lipid Panel    Component Value Date/Time   CHOL 229 (H) 05/06/2020 0201   TRIG 298 (H) 05/06/2020 0201   HDL 48 05/06/2020 0201   CHOLHDL 4.8 05/06/2020 0201   VLDL 60 (H) 05/06/2020 0201   LDLCALC 121 (H) 05/06/2020 0201    Physical Exam:    Vital Signs: BP 122/78    Pulse 70    Ht  (1.651 m)    Wt 198 lb 9.6 oz (90.1 kg)    SpO2 95%    BMI 33.05 kg/m     Wt Readings from Last 3 Encounters:  05/19/20 198 lb 9.6 oz (90.1 kg)  05/07/20 200 lb 13.4 oz (91.1 kg)  02/10/18 219 lb (99.3 kg)     General: 64 y.o. male in no acute  distress. HEENT: Normocephalic and atraumatic. Sclera clear.  Neck: Supple. No carotid bruits. No JVD. Heart: RRR. Distinct S1 and  S2. No murmurs, gallops, or rubs. Radial pulses 2+ and equal bilaterally. Right radial and right femoral cath sites soft with no signs of hematoma. Mild ecchymosis at right femoral cath site. Lungs: No increased work of breathing. Clear to ausculation bilaterally. No wheezes, rhonchi, or rales.  Abdomen: Soft, non-distended, and non-tender to palpation.  Extremities: No lower extremity edema.    Skin: Warm and dry.  Neuro: No focal deficits. Psych: Normal affect. Responds appropriately.   Assessment:    1. Coronary artery disease involving native coronary artery of native heart without angina pectoris   2. Ischemic cardiomyopathy   3. PAD (peripheral artery disease) (HCC)   4. Essential hypertension   5. Hyperlipidemia, unspecified hyperlipidemia type   6. Type 2 diabetes mellitus with complication, with long-term current use of insulin (HCC)   7. Tobacco abuse   8. Medication management     Plan:    CAD s/p Recent STEMI - Recently admitted with inferior STEMI s/p DES to mid RCA.  - Patient reports some chest discomfort in his chest that he describes as gas similar to prior reflux. No exertional chest pain or similar symptoms prior to MI. - Continue dual antiplatelet therapy with Aspirin and Brilinta. Provided patient assistance form for Brilinta. He states he should have insurance soon. - Continue beta-blocker and high-intensity statin.  - Will ordered Protonix 40mg  daily for possible reflux. - Encouraged patient to gradually increase physical activity. Recommend participation in Cardiac Rehab. Patient would like Cardiac Rehab to call his son James IvanoffWalter's number [phone]). Patient work's at [employer] and does a lot of heavy lifting. Therefore, will provide patient a work note excusing him for work for 2 more weeks (4 total since MI) with  instructions to gradually increase lifting weight once he returns.  Ischemic Cardiomyopathy - Echo showed LVEF of 45-50% with basal to mid inferior hypokinesis and grade 1 diastolic dysfunction.  - Patient appears euvoelmic on exam. - Continue Lopressor 25mg  twice daily. Can discuss switching to Toprol-XL at follow-up. - Continue Lisinopril 5mg  daily.  - Will repeat BMET. - Consider repeat Echo in 3 months.   PAD - Found to have occluded right iliac during cath.  - He notes some calf cramping at night but denies and claudication.  - Will check BMET and Mg for leg cramps. - Will have patient follow-up with Dr. Allyson SabalBerry.   Hypertension - BP well controlled.  - Continue Lopressor and Lisinopril.   Hyperlipidemia - Lipid panel during recent admission: Total Cholesterol 229, Triglycerides 298, HDL 48, LDL 121. - LDL goal <70 given CAD.  - Continue Lipitor 80mg  daily (started during admission).  - Will need lipid panel and LFTs in 6-8 weeks.   Uncontrolled Type 2 Diabetes  - Hemoglobin A1c 11.8% during recent admission. - Continue Novolin.  - Patient is in the process of getting re-established with PCP. He going to [facility]. However, he will take his last dose of Novolin today. Will reorder home Novolin today with 0 refills to give him time to see PCP. However, patient knows that we will not be managing this in the future.  Tobacco Abuse - Patient trying to quit. He was smoking 3/4 pack per day before hospitalization but is down to 3-4 cigarettes per day. - He would like to try Nicotin patches so will prescribe today. Based on how much he is smoking will provide 14mg  patch for 6 weeks and then 7mg  patch for 2 weeks. - Discussed importance  of not smoking while wearing Nicotine patch.   Disposition: Follow up in 1-2 months with Dr. Allyson Sabal.   Medication Adjustments/Labs and Tests Ordered: Current medicines are reviewed at length with the patient today.   Concerns regarding medicines are outlined above.  Orders Placed This Encounter  Procedures   Basic metabolic panel   Lipid panel   Hepatic function panel   Magnesium   EKG 12-Lead   Meds ordered this encounter  Medications   pantoprazole (PROTONIX) 40 MG tablet    Sig: Take 1 tablet (40 mg total) by mouth daily.    Dispense:  30 tablet    Refill:  11   nicotine (NICODERM CQ - DOSED IN MG/24 HOURS) 14 mg/24hr patch    Sig: Place a patch (14 mg) on your skin daily for 6 weeks    Dispense:  42 patch    Refill:  0   nicotine (NICODERM CQ - DOSED IN MG/24 HR) 7 mg/24hr patch    Sig: Place a patch on your skin daily for 14 days.    Dispense:  14 patch    Refill:  0   insulin NPH-regular Human (NOVOLIN 70/30) (70-30) 100 UNIT/ML injection    Sig: Inject 8 Units into the skin 2 (two) times daily with a meal.    Dispense:  10 mL    Refill:  0    Send to PCP for  Future refills    Patient Instructions  Medication Instructions:   START Prontonix 40 mg daily  Place a Nicotine Patch (14 MG) on your skin daily for 6 weeks, then place a 7 mg patch on your skin daily for 14 day. DO NOT SMOKE WHILE WEARING THE PATCH *If you need a refill on your cardiac medications before your next appointment, please call your pharmacy*  Lab Work: Your physician recommends that you return for lab work in TODAY:   BMET  Magnesium  Your physician recommends that you return for lab work in 6 weeks:     Hepatic (Liver) Function test If you have labs (blood work) drawn today and your tests are completely normal, you will receive your results only by:  MyChart Message (if you have MyChart) OR  A paper copy in the mail If you have any lab test that is abnormal or we need to change your treatment, we will call you to review the results.  Testing/Procedures: NONE ordered at this time of appointment   Follow-Up: At [facility], you and your health needs are our priority.  As part of our  continuing mission to provide you with exceptional heart care, we have created designated Provider Care Teams.  These Care Teams include your primary Cardiologist (physician) and Advanced Practice Providers (APPs -  Physician Assistants and Nurse Practitioners) who all work together to provide you with the care you need, when you need it.  We recommend signing up for the patient portal called "MyChart".  Sign up information is provided on this After Visit Summary.  MyChart is used to connect with patients for Virtual Visits (Telemedicine).  Patients are able to view lab/test results, encounter notes, upcoming appointments, etc.  Non-urgent messages can be sent to your provider as well.   To learn more about what you can do with MyChart, go to [email or web address].    Your next appointment:   1-2 month(s)  The format for your next appointment:   In Person  Provider:   Nanetta Batty, MD  Other Instructions  Signed, Corrin Parker, PA-C  05/19/2020 1:33 PM    [facility]

## 2020-05-16 ENCOUNTER — Telehealth (HOSPITAL_COMMUNITY): Payer: Self-pay

## 2020-05-16 NOTE — Telephone Encounter (Signed)
Called patient to see if he is interested in the Cardiac Rehab Program. Patient expressed interest. Explained scheduling process patient verbalized understanding. Will contact patient for scheduling once f/u has been completed.  Pt interested in the VCR program.

## 2020-05-19 ENCOUNTER — Other Ambulatory Visit: Payer: Self-pay

## 2020-05-19 ENCOUNTER — Ambulatory Visit (INDEPENDENT_AMBULATORY_CARE_PROVIDER_SITE_OTHER): Payer: Self-pay | Admitting: Student

## 2020-05-19 ENCOUNTER — Encounter: Payer: Self-pay | Admitting: Student

## 2020-05-19 VITALS — BP 122/78 | HR 70 | Ht 65.0 in | Wt 198.6 lb

## 2020-05-19 DIAGNOSIS — I2510 Atherosclerotic heart disease of native coronary artery without angina pectoris: Secondary | ICD-10-CM

## 2020-05-19 DIAGNOSIS — Z794 Long term (current) use of insulin: Secondary | ICD-10-CM

## 2020-05-19 DIAGNOSIS — I739 Peripheral vascular disease, unspecified: Secondary | ICD-10-CM

## 2020-05-19 DIAGNOSIS — I10 Essential (primary) hypertension: Secondary | ICD-10-CM

## 2020-05-19 DIAGNOSIS — E118 Type 2 diabetes mellitus with unspecified complications: Secondary | ICD-10-CM

## 2020-05-19 DIAGNOSIS — E785 Hyperlipidemia, unspecified: Secondary | ICD-10-CM

## 2020-05-19 DIAGNOSIS — I255 Ischemic cardiomyopathy: Secondary | ICD-10-CM

## 2020-05-19 DIAGNOSIS — Z720 Tobacco use: Secondary | ICD-10-CM

## 2020-05-19 DIAGNOSIS — Z79899 Other long term (current) drug therapy: Secondary | ICD-10-CM

## 2020-05-19 MED ORDER — NICOTINE 14 MG/24HR TD PT24
MEDICATED_PATCH | TRANSDERMAL | 0 refills | Status: DC
Start: 2020-05-19 — End: 2024-08-14

## 2020-05-19 MED ORDER — PANTOPRAZOLE SODIUM 40 MG PO TBEC: 40.0000 mg | DELAYED_RELEASE_TABLET | Freq: Every day | ORAL | 11 refills | Status: DC

## 2020-05-19 MED ORDER — NICOTINE 7 MG/24HR TD PT24: MEDICATED_PATCH | TRANSDERMAL | 0 refills | Status: DC

## 2020-05-19 MED ORDER — NOVOLIN 70/30 (70-30) 100 UNIT/ML ~~LOC~~ SUSP: 8.0000 [IU] | Freq: Two times a day (BID) | SUBCUTANEOUS | 0 refills | Status: DC

## 2020-05-19 NOTE — Patient Instructions (Signed)
Medication Instructions:   START Prontonix 40 mg daily  Place a Nicotine Patch (14 MG) on your skin daily for 6 weeks, then place a 7 mg patch on your skin daily for 14 day. DO NOT SMOKE WHILE WEARING THE PATCH *If you need a refill on your cardiac medications before your next appointment, please call your pharmacy*  Lab Work: Your physician recommends that you return for lab work in TODAY:   BMET  Magnesium  Your physician recommends that you return for lab work in 6 weeks:     Hepatic (Liver) Function test If you have labs (blood work) drawn today and your tests are completely normal, you will receive your results only by: Marland Kitchen MyChart Message (if you have MyChart) OR . A paper copy in the mail If you have any lab test that is abnormal or we need to change your treatment, we will call you to review the results.  Testing/Procedures: NONE ordered at this time of appointment   Follow-Up: At [facility], you and your health needs are our priority.  As part of our continuing mission to provide you with exceptional heart care, we have created designated Provider Care Teams.  These Care Teams include your primary Cardiologist (physician) and Advanced Practice Providers (APPs -  Physician Assistants and Nurse Practitioners) who all work together to provide you with the care you need, when you need it.  We recommend signing up for the patient portal called "MyChart".  Sign up information is provided on this After Visit Summary.  MyChart is used to connect with patients for Virtual Visits (Telemedicine).  Patients are able to view lab/test results, encounter notes, upcoming appointments, etc.  Non-urgent messages can be sent to your provider as well.   To learn more about what you can do with MyChart, go to [email or web address].    Your next appointment:   1-2 month(s)  The format for your next appointment:   In Person  Provider:   Nanetta Batty, MD  Other Instructions

## 2020-05-30 ENCOUNTER — Telehealth: Payer: Self-pay

## 2020-05-30 ENCOUNTER — Telehealth (HOSPITAL_COMMUNITY): Payer: Self-pay

## 2020-05-30 NOTE — Telephone Encounter (Signed)
-----   Message from Corrin Parker, [place] sent at 05/28/2020  4:49 PM EDT ----- Regarding: Medication Follow Up Jonathan Miller,  [facility] you are having a good weekend. Do you mind following up on one thing for this patient? I saw him in the office last week for follow-up of STEMI. He is on Brilinta but there was some concern that we may have to switch to Plavix due to cost. He was supposed to be getting insurance soon which would hopefully allow [place] to stay on Brilinta. Do you mind following up with him to check to make sure he is going to be able to afford Brilinta? I am worried that we are going to get the last minute call that he is out of the mediation and cannot afford refill.  Thank you so much! Callie

## 2020-05-30 NOTE — Telephone Encounter (Signed)
Called and spoke with pt son Jonathan Miller in regards to CR, he stated pt is interested. Patient will come in for orientation on 06/10/20 @ 9AM and will attend the VCR program only.  Mailed letter

## 2020-05-30 NOTE — Telephone Encounter (Signed)
I called patient, he states that he is going to call the pharmacy to see how much the medication is going to cost- and if he is unable to afford it he will let us know so that we can switch him as he understands he can not be without any medication.   Patient will let us know.  Wanted to update you!

## 2020-06-02 ENCOUNTER — Other Ambulatory Visit: Payer: Self-pay | Admitting: Cardiovascular Disease

## 2020-06-02 NOTE — Telephone Encounter (Signed)
Thank you :)

## 2020-06-02 NOTE — Telephone Encounter (Signed)
*  STAT* If patient is at the pharmacy, call can be transferred to refill team.   1. Which medications need to be refilled? (please list name of each medication and dose if known)  atorvastatin (LIPITOR) 80 MG tablet lisinopril (ZESTRIL) 5 MG tablet metFORMIN (GLUCOPHAGE) 500 MG tablet ticagrelor (BRILINTA) 90 MG TABS tablet  2. Which pharmacy/location (including street and city if local pharmacy) is medication to be sent to? [id number] - [place], [place] - [address]  3. Do they need a 30 day or 90 day supply? 30 day supply

## 2020-06-03 MED ORDER — ATORVASTATIN CALCIUM 80 MG PO TABS
80.0000 mg | ORAL_TABLET | Freq: Every day | ORAL | 6 refills | Status: DC
Start: 2020-06-03 — End: 2020-12-29

## 2020-06-03 MED ORDER — TICAGRELOR 90 MG PO TABS
90.0000 mg | ORAL_TABLET | Freq: Two times a day (BID) | ORAL | 6 refills | Status: DC
Start: 2020-06-03 — End: 2020-12-29

## 2020-06-03 MED ORDER — LISINOPRIL 5 MG PO TABS
5.0000 mg | ORAL_TABLET | Freq: Every day | ORAL | 6 refills | Status: DC
Start: 2020-06-03 — End: 2020-12-29

## 2020-06-03 MED FILL — ASPIRIN LOW DOSE 81 MG TBEC: 81 | 30 days supply | Qty: 30 | Fill #0

## 2020-06-03 MED FILL — METOPROLOL TARTRATE 25 MG T: 25 | 30 days supply | Qty: 60 | Fill #0

## 2020-06-04 ENCOUNTER — Telehealth (HOSPITAL_COMMUNITY): Payer: Self-pay

## 2020-06-04 NOTE — Telephone Encounter (Signed)
Cardiac Rehab Medication Review by a Pharmacist  Does the patient  feel that his/her medications are working for him/her?  yes  Has the patient been experiencing any side effects to the medications prescribed?  no  Does the patient measure his/her own blood pressure or blood glucose at home?  yes - the patient does not take their blood pressure at home but does take their blood glucose at home twice per day. The patient reports that their blood glucose readings range from 103-220.   Does the patient have any problems obtaining medications due to transportation or finances?   no  Understanding of regimen: good Understanding of indications: good Potential of compliance: good   Sanda Klein, PharmD, RPh  PGY-1 Pharmacy Resident 06/04/2020 4:47 PM  Please check [email or web address] for unit-specific pharmacy phone numbers.

## 2020-06-10 ENCOUNTER — Encounter: Payer: Self-pay | Admitting: Family Medicine

## 2020-06-10 ENCOUNTER — Other Ambulatory Visit: Payer: Self-pay

## 2020-06-10 ENCOUNTER — Ambulatory Visit: Payer: Self-pay | Attending: Family Medicine | Admitting: Family Medicine

## 2020-06-10 ENCOUNTER — Encounter (HOSPITAL_COMMUNITY)
Admission: RE | Admit: 2020-06-10 | Discharge: 2020-06-10 | Disposition: A | Discharge status: Home / Self Care | Payer: Self-pay | Source: Ambulatory Visit | Attending: Cardiovascular Disease | Admitting: Cardiovascular Disease

## 2020-06-10 ENCOUNTER — Encounter (HOSPITAL_COMMUNITY): Payer: Self-pay

## 2020-06-10 VITALS — BP 114/70 | HR 57 | Ht 64.0 in | Wt 197.0 lb

## 2020-06-10 VITALS — BP 114/72 | HR 64 | Resp 18 | Ht 64.0 in | Wt 196.2 lb

## 2020-06-10 DIAGNOSIS — I10 Essential (primary) hypertension: Secondary | ICD-10-CM

## 2020-06-10 DIAGNOSIS — E118 Type 2 diabetes mellitus with unspecified complications: Secondary | ICD-10-CM

## 2020-06-10 DIAGNOSIS — I2111 ST elevation (STEMI) myocardial infarction involving right coronary artery: Secondary | ICD-10-CM

## 2020-06-10 DIAGNOSIS — Z720 Tobacco use: Secondary | ICD-10-CM

## 2020-06-10 DIAGNOSIS — I739 Peripheral vascular disease, unspecified: Secondary | ICD-10-CM

## 2020-06-10 DIAGNOSIS — Z955 Presence of coronary angioplasty implant and graft: Secondary | ICD-10-CM | POA: Insufficient documentation

## 2020-06-10 LAB — GLUCOSE, POCT (MANUAL RESULT ENTRY): POC Glucose: 131 mg/dl — AB (ref 70–99)

## 2020-06-10 MED ORDER — NOVOLIN 70/30 (70-30) 100 UNIT/ML ~~LOC~~ SUSP
10.0000 [IU] | Freq: Two times a day (BID) | SUBCUTANEOUS | 6 refills | Status: AC
Start: 2020-06-10 — End: ?

## 2020-06-10 NOTE — Progress Notes (Signed)
Subjective:  Patient ID: Jonathan JunglingJohn W Epperly Jr., male    DOB: 02/04/1956  Age: 64 y.o. MRN: [id number]  CC: Hospitalization Follow-up   HPI Jonathan JunglingJohn W Hemsley Jr. is a 64 year old male with history of type 2 diabetes mellitus (A1c 11.8 ), hypertension, PVD, CAD with recent STEMI in 04/2020 status post DES to RCA tobacco abuse, obesity, who presents to the clinic for follow-up visit.  Last seen in the clinic in 01/2018.  Echo revealed EF of 45 to 50%, LV regional wall abnormalities, basal to mid inferior hypokinesis, LVH, grade 1 DD.  Cardiac catheter also revealed PAD with occluded right iliac. He was seen by cardiology for follow-up visit 3 weeks ago and is currently on dual antiplatelet therapy with aspirin and Brilinta.  With regards to his diabetes mellitus, fasting sugar was 134 this morning; highest random sugar was 250 and he has no hypoglycemia. He eats mostly chicken and fish. He is walking and trying to loose weight. Denies neuropathy symptoms. He's been a bit fatigued since his cardiac procedures.  Smoking 8-10 cig/ day and is using the patches for smoking cessation.  Tolerant of his antihypertensive and statin and has no additional concerns today.  Denies bruising or bleeding from his antiplatelet therapy. Past Medical History:  Diagnosis Date  . Arthritis    "right shoulder" (02/09/2016)  . Bell's palsy 01/2012; 02/09/2016  . CAD (coronary artery disease)    a. inferior STEMI 04/2020 s/p DES to mRCA, EF 45-50% by echo.  . Daily headache    "recently" (02/09/2016)  . GERD (gastroesophageal reflux disease)   . Hiatal hernia   . History of hiatal hernia   . HTN (hypertension)   . Hyperlipidemia   . Hypertension   . Ischemic cardiomyopathy   . Obesity   . PAD (peripheral artery disease) (HCC)   . Renal insufficiency    noted 04/2020 admit  . STEMI (ST elevation myocardial infarction) (HCC)   . Tobacco abuse   . Tobacco use   . Type 2 diabetes mellitus (HCC)   . Type II diabetes  mellitus (HCC)     Past Surgical History:  Procedure Laterality Date  . CORONARY/GRAFT ACUTE MI REVASCULARIZATION N/A 05/06/2020   Procedure: Coronary/Graft Acute MI Revascularization;  Surgeon: Runell GessBerry, Jonathan J, MD;  [facility];  Service: Cardiovascular;  Laterality: N/A;  . LEFT HEART CATH AND CORONARY ANGIOGRAPHY N/A 05/06/2020   Procedure: LEFT HEART CATH AND CORONARY ANGIOGRAPHY;  Surgeon: Runell GessBerry, Jonathan J, MD;  Location: [facility];  Service: Cardiovascular;  Laterality: N/A;  . NO PAST SURGERIES      Family History  Family history unknown: Yes    No Known Allergies  Outpatient Medications Prior to Visit  Medication Sig Dispense Refill  . aspirin EC 81 MG tablet Take 1 tablet (81 mg total) by mouth daily. Swallow whole. 30 tablet 11  . atorvastatin (LIPITOR) 80 MG tablet Take 1 tablet (80 mg total) by mouth daily. 30 tablet 6  . Insulin Pen Needle (PEN NEEDLES) 32G X 4 MM MISC 1 each by Does not apply route 2 (two) times daily. 100 each 2  . lisinopril (ZESTRIL) 5 MG tablet Take 1 tablet (5 mg total) by mouth daily. 30 tablet 6  . metoprolol tartrate (LOPRESSOR) 25 MG tablet Take 1 tablet (25 mg total) by mouth 2 (two) times daily. 60 tablet 6  . nitroGLYCERIN (NITROSTAT) 0.4 MG SL tablet Place 1 tablet (0.4 mg total) under the tongue  every 5 (five) minutes as needed for chest pain (up to 3 doses max). 25 tablet 3  . pantoprazole (PROTONIX) 40 MG tablet Take 1 tablet (40 mg total) by mouth daily. 30 tablet 11  . ticagrelor (BRILINTA) 90 MG TABS tablet Take 1 tablet (90 mg total) by mouth 2 (two) times daily. 60 tablet 6  . insulin NPH-regular Human (NOVOLIN 70/30) (70-30) 100 UNIT/ML injection Inject 8 Units into the skin 2 (two) times daily with a meal. 10 mL 0  . nicotine (NICODERM CQ - DOSED IN MG/24 HOURS) 14 mg/24hr patch Place a patch (14 mg) on your skin daily for 6 weeks (Patient not taking: Reported on 06/10/2020) 42 patch 0  . nicotine (NICODERM  CQ - DOSED IN MG/24 HR) 7 mg/24hr patch Place a patch on your skin daily for 14 days. (Patient not taking: Reported on 06/10/2020) 14 patch 0   No facility-administered medications prior to visit.     ROS Review of Systems  Constitutional: Positive for fatigue. Negative for activity change and appetite change.  HENT: Negative for sinus pressure and sore throat.   Eyes: Negative for visual disturbance.  Respiratory: Negative for cough, chest tightness and shortness of breath.   Cardiovascular: Negative for chest pain and leg swelling.  Gastrointestinal: Negative for abdominal distention, abdominal pain, constipation and diarrhea.  Endocrine: Negative.   Genitourinary: Negative for dysuria.  Musculoskeletal: Negative for joint swelling and myalgias.  Skin: Negative for rash.  Allergic/Immunologic: Negative.   Neurological: Negative for weakness, light-headedness and numbness.  Psychiatric/Behavioral: Negative for dysphoric mood and suicidal ideas.    Objective:  BP 114/70   Pulse (!) 57   Ht 5\' 4"  (1.626 m)   Wt 197 lb (89.4 kg)   SpO2 98%   BMI 33.81 kg/m   BP/Weight 06/10/2020 06/10/2020 05/19/2020  Systolic BP 114 114 122  Diastolic BP 72 70 78  Wt. (Lbs) 196.21 197 198.6  BMI 33.68 33.81 33.05      Physical Exam Constitutional:      Appearance: He is well-developed.  Neck:     Vascular: No JVD.  Cardiovascular:     Rate and Rhythm: Bradycardia present.     Heart sounds: Normal heart sounds. No murmur heard.   Pulmonary:     Effort: Pulmonary effort is normal.     Breath sounds: Normal breath sounds. No wheezing or rales.  Chest:     Chest wall: No tenderness.  Abdominal:     General: Bowel sounds are normal. There is no distension.     Palpations: Abdomen is soft. There is no mass.     Tenderness: There is no abdominal tenderness.  Musculoskeletal:        General: Normal range of motion.     Right lower leg: No edema.     Left lower leg: No edema.    Neurological:     Mental Status: He is alert and oriented to person, place, and time.  Psychiatric:        Mood and Affect: Mood normal.      Diabetic Foot Exam - Simple   Simple Foot Form Diabetic Foot exam was performed with the following findings: Yes 06/10/2020  3:13 PM  Visual Inspection No deformities, no ulcerations, no other skin breakdown bilaterally: Yes See comments: Yes Sensation Testing Intact to touch and monofilament testing bilaterally: Yes Pulse Check See comments: Yes Comments Thickened big toenails bilaterally Unable to palpate dorsalis pedis and posterior tibialis bilaterally  CMP Latest Ref Rng & Units 05/07/2020 05/06/2020 05/06/2020  Glucose 70 - 99 mg/dL 517(O) 160(V) 371(G)  BUN 8 - 23 mg/dL 62(I) 20 21  Creatinine 0.61 - 1.24 mg/dL 9.48(N) 4.62 7.03(J)  Sodium 135 - 145 mmol/L 135 132(L) 134(L)  Potassium 3.5 - 5.1 mmol/L 4.1 4.3 4.5  Chloride 98 - 111 mmol/L 101 97(L) 102  CO2 22 - 32 mmol/L 22 23 22   Calcium 8.9 - 10.3 mg/dL 9.2 9.4 8.9  Total Protein 6.5 - 8.1 g/dL - - 5.5(L)  Total Bilirubin 0.3 - 1.2 mg/dL - - 0.9  Alkaline Phos 38 - 126 U/L - - 56  AST 15 - 41 U/L - - 22  ALT 0 - 44 U/L - - 24    Lipid Panel     Component Value Date/Time   CHOL 229 (H) 05/06/2020 0201   TRIG 298 (H) 05/06/2020 0201   HDL 48 05/06/2020 0201   CHOLHDL 4.8 05/06/2020 0201   VLDL 60 (H) 05/06/2020 0201   LDLCALC 121 (H) 05/06/2020 0201    CBC    Component Value Date/Time   WBC 10.8 (H) 05/07/2020 0553   RBC 5.16 05/07/2020 0553   HGB 15.6 05/07/2020 0553   HCT 47.9 05/07/2020 0553   PLT 285 05/07/2020 0553   MCV 92.8 05/07/2020 0553   MCH 30.2 05/07/2020 0553   MCHC 32.6 05/07/2020 0553   RDW 13.0 05/07/2020 0553   LYMPHSABS 1.5 05/06/2020 0201   MONOABS 0.9 05/06/2020 0201   EOSABS 0.2 05/06/2020 0201   BASOSABS 0.1 05/06/2020 0201    Lab Results  Component Value Date   HGBA1C 11.8 (H) 05/06/2020     Assessment & Plan:  1.  Diabetes mellitus with complication (HCC) Uncontrolled with A1c of 11.8; goal is less than 7.0 He had been previously noncompliant but we have restarted his medications and blood sugar log reveals blood sugars at goal Increase NovoLog 70/30 slightly from 8 units to 10 units twice daily Counseled on Diabetic diet, my plate method, 05/08/2020 minutes of moderate intensity exercise/week Blood sugar logs with fasting goals of 80-120 mg/dl, random of less than 009 and in the event of sugars less than 60 mg/dl or greater than 381 mg/dl encouraged to notify the clinic. Advised on the need for annual eye exams, annual foot exams, Pneumonia vaccine. - POCT glucose (manual entry) - insulin NPH-regular Human (NOVOLIN 70/30) (70-30) 100 UNIT/ML injection; Inject 10 Units into the skin 2 (two) times daily with a meal.  Dispense: 30 mL; Refill: 6  2. Acute ST elevation myocardial infarction (STEMI) involving right coronary artery (HCC) Status post DES to RCA Dual antiplatelet therapy with Aspirin and Brilinta Risk factor modification Follow-up with cardiology  3. PAD (peripheral artery disease) [facility]) Medical management per cardiology note Risk factor modification  4. Essential hypertension Controlled Counseled on blood pressure goal of less than 130/80, low-sodium, DASH diet, medication compliance, 150 minutes of moderate intensity exercise per week. Discussed medication compliance, adverse effects.  5. Tobacco abuse Spent 3 minutes counseling on cessation and he is not ready to quit at this time.    Meds ordered this encounter  Medications  . insulin NPH-regular Human (NOVOLIN 70/30) (70-30) 100 UNIT/ML injection    Sig: Inject 10 Units into the skin 2 (two) times daily with a meal.    Dispense:  30 mL    Refill:  6    Follow-up: Return in about 3 months (around 09/09/2020) for Chronic disease management.  Hoy Register, MD, FAAFP. [facility]  [place], [place] [phone]   06/11/2020, 3:07 PM

## 2020-06-10 NOTE — Progress Notes (Signed)
Cardiac Individual Treatment Plan  Patient Details  Name: Jonathan Miller. MRN: [id number] Date of Birth: 1956-07-19 Referring Provider:     CARDIAC REHAB PHASE II ORIENTATION from 06/10/2020 in [facility] CARDIAC REHAB  Referring Provider Runell Gess, MD      Initial Encounter Date:    CARDIAC REHAB PHASE II ORIENTATION from 06/10/2020 in [facility] CARDIAC REHAB  Date 06/10/20      Visit Diagnosis: Acute ST elevation myocardial infarction (STEMI) involving right coronary artery [facility])  Status post coronary artery stent placement  Patient's Home Medications on Admission:  Current Outpatient Medications:  .  aspirin EC 81 MG tablet, Take 1 tablet (81 mg total) by mouth daily. Swallow whole., Disp: 30 tablet, Rfl: 11 .  atorvastatin (LIPITOR) 80 MG tablet, Take 1 tablet (80 mg total) by mouth daily., Disp: 30 tablet, Rfl: 6 .  insulin NPH-regular Human (NOVOLIN 70/30) (70-30) 100 UNIT/ML injection, Inject 8 Units into the skin 2 (two) times daily with a meal., Disp: 10 mL, Rfl: 0 .  Insulin Pen Needle (PEN NEEDLES) 32G X 4 MM MISC, 1 each by Does not apply route 2 (two) times daily., Disp: 100 each, Rfl: 2 .  lisinopril (ZESTRIL) 5 MG tablet, Take 1 tablet (5 mg total) by mouth daily., Disp: 30 tablet, Rfl: 6 .  metoprolol tartrate (LOPRESSOR) 25 MG tablet, Take 1 tablet (25 mg total) by mouth 2 (two) times daily., Disp: 60 tablet, Rfl: 6 .  nicotine (NICODERM CQ - DOSED IN MG/24 HOURS) 14 mg/24hr patch, Place a patch (14 mg) on your skin daily for 6 weeks (Patient not taking: Reported on 06/10/2020), Disp: 42 patch, Rfl: 0 .  nicotine (NICODERM CQ - DOSED IN MG/24 HR) 7 mg/24hr patch, Place a patch on your skin daily for 14 days. (Patient not taking: Reported on 06/10/2020), Disp: 14 patch, Rfl: 0 .  nitroGLYCERIN (NITROSTAT) 0.4 MG SL tablet, Place 1 tablet (0.4 mg total) under the tongue every 5 (five) minutes as needed for chest pain (up to 3  doses max)., Disp: 25 tablet, Rfl: 3 .  pantoprazole (PROTONIX) 40 MG tablet, Take 1 tablet (40 mg total) by mouth daily., Disp: 30 tablet, Rfl: 11 .  ticagrelor (BRILINTA) 90 MG TABS tablet, Take 1 tablet (90 mg total) by mouth 2 (two) times daily., Disp: 60 tablet, Rfl: 6  Past Medical History: Past Medical History:  Diagnosis Date  . Arthritis    "right shoulder" (02/09/2016)  . Bell's palsy 01/2012; 02/09/2016  . CAD (coronary artery disease)    a. inferior STEMI 04/2020 s/p DES to mRCA, EF 45-50% by echo.  . Daily headache    "recently" (02/09/2016)  . GERD (gastroesophageal reflux disease)   . Hiatal hernia   . History of hiatal hernia   . HTN (hypertension)   . Hyperlipidemia   . Hypertension   . Ischemic cardiomyopathy   . Obesity   . PAD (peripheral artery disease) (HCC)   . Renal insufficiency    noted 04/2020 admit  . STEMI (ST elevation myocardial infarction) (HCC)   . Tobacco abuse   . Tobacco use   . Type 2 diabetes mellitus (HCC)   . Type II diabetes mellitus (HCC)     Tobacco Use: Social History   Tobacco Use  Smoking Status Current Every Day Smoker  . Packs/day: 0.50  . Years: 50.00  . Pack years: 25.00  . Types: Cigarettes  Smokeless Tobacco Never Used  Tobacco Comment   02/09/2016 "chewed from age [age]"    Labs: Recent Review Flowsheet Data    Labs for ITP Cardiac and Pulmonary Rehab Latest Ref Rng & Units 11/30/2016 12/06/2016 03/02/2017 02/10/2018 05/06/2020   Cholestrol 0 - 200 mg/dL - 295 - - 621(H)   LDLCALC 0 - 99 mg/dL - - - - 086(V)   HDL >78 mg/dL - 48 - - 48   Trlycerides <150 mg/dL - 469(G) - - 295(M)   Hemoglobin A1c 4.8 - 5.6 % 7.1 - 6.7 8.8(H) 11.8(H)   TCO2 0 - 100 mmol/L - - - - -      Capillary Blood Glucose: Lab Results  Component Value Date   GLUCAP 198 (H) 05/07/2020   GLUCAP 218 (H) 05/07/2020   GLUCAP 161 (H) 05/06/2020   GLUCAP 207 (H) 05/06/2020   GLUCAP 210 (H) 05/06/2020    POCT Glucose    Row Name 06/10/20 0935               POCT Blood Glucose   Pre-Exercise 124 mg/dL  home fasting CBG              Exercise Target Goals: Exercise Program Goal: Individual exercise prescription set using results from initial 6 min walk test and THRR while considering  patient's activity barriers and safety.   Exercise Prescription Goal: Initial exercise prescription builds to 30-45 minutes a day of aerobic activity, 2-3 days per week.  Home exercise guidelines will be given to patient during program as part of exercise prescription that the participant will acknowledge.  Activity Barriers & Risk Stratification:  Activity Barriers & Cardiac Risk Stratification - 06/10/20 0952      Activity Barriers & Cardiac Risk Stratification   Activity Barriers Arthritis   Arthritis- shoulders   Cardiac Risk Stratification High           6 Minute Walk:  6 Minute Walk    Row Name 06/10/20 1005         6 Minute Walk   Phase Initial     Distance 1341 feet     Walk Time 6 minutes     # of Rest Breaks 0     MPH 2.54     METS 2.82     RPE 7     Perceived Dyspnea  0     VO2 Peak 9.88     Symptoms Yes (comment)     Comments Bilateral calf tightness     Resting HR 64 bpm     Resting BP 114/72     Resting Oxygen Saturation  97 %     Exercise Oxygen Saturation  during 6 min walk 97 %     Max Ex. HR 78 bpm     Max Ex. BP 152/83     2 Minute Post BP 138/79            Oxygen Initial Assessment:   Oxygen Re-Evaluation:   Oxygen Discharge (Final Oxygen Re-Evaluation):   Initial Exercise Prescription:  Initial Exercise Prescription - 06/10/20 1000      Date of Initial Exercise RX and Referring Provider   Date 06/10/20    Referring Provider Runell Gess, MD    Expected Discharge Date 08/05/20      Track   Minutes 30      Prescription Details   Frequency (times per week) 5-7    Duration Progress to 30 minutes of continuous aerobic without signs/symptoms of physical distress  Intensity    THRR 40-80% of Max Heartrate 62-125    Ratings of Perceived Exertion 11-13    Perceived Dyspnea 0-4      Progression   Progression Continue to progress workloads to maintain intensity without signs/symptoms of physical distress.      Resistance Training   Training Prescription Yes    Reps 10-15           Perform Capillary Blood Glucose checks as needed.  Exercise Prescription Changes:   Exercise Comments:   Exercise Comments    Row Name 06/10/20 0933           Exercise Comments Reviewed home exercise plan with patient.              Exercise Goals and Review:   Exercise Goals    Row Name 06/10/20 0933             Exercise Goals   Increase Physical Activity Yes       Intervention Provide advice, education, support and counseling about physical activity/exercise needs.;Develop an individualized exercise prescription for aerobic and resistive training based on initial evaluation findings, risk stratification, comorbidities and participant's personal goals.       Expected Outcomes Short Term: Attend rehab on a regular basis to increase amount of physical activity.;Long Term: Exercising regularly at least 3-5 days a week.;Long Term: Add in home exercise to make exercise part of routine and to increase amount of physical activity.       Increase Strength and Stamina Yes       Intervention Provide advice, education, support and counseling about physical activity/exercise needs.;Develop an individualized exercise prescription for aerobic and resistive training based on initial evaluation findings, risk stratification, comorbidities and participant's personal goals.       Expected Outcomes Short Term: Increase workloads from initial exercise prescription for resistance, speed, and METs.;Short Term: Perform resistance training exercises routinely during rehab and add in resistance training at home;Long Term: Improve cardiorespiratory fitness, muscular endurance and strength as  measured by increased METs and functional capacity ( )       Able to understand and use rate of perceived exertion (RPE) scale Yes       Intervention Provide education and explanation on how to use RPE scale       Expected Outcomes Short Term: Able to use RPE daily in rehab to express subjective intensity level;Long Term:  Able to use RPE to guide intensity level when exercising independently       Knowledge and understanding of Target Heart Rate Range (THRR) Yes       Intervention Provide education and explanation of THRR including how the numbers were predicted and where they are located for reference       Expected Outcomes Short Term: Able to state/look up THRR;Long Term: Able to use THRR to govern intensity when exercising independently;Short Term: Able to use daily as guideline for intensity in rehab       Able to check pulse independently Yes       Intervention Provide education and demonstration on how to check pulse in carotid and radial arteries.;Review the importance of being able to check your own pulse for safety during independent exercise       Expected Outcomes Short Term: Able to explain why pulse checking is important during independent exercise;Long Term: Able to check pulse independently and accurately       Understanding of Exercise Prescription Yes       Intervention Provide education,  explanation, and written materials on patient's individual exercise prescription       Expected Outcomes Short Term: Able to explain program exercise prescription;Long Term: Able to explain home exercise prescription to exercise independently              Exercise Goals Re-Evaluation :   Discharge Exercise Prescription (Final Exercise Prescription Changes):   Nutrition:  Target Goals: Understanding of nutrition guidelines, daily intake of sodium 1500mg , cholesterol 200mg , calories 30% from fat and 7% or less from saturated fats, daily to have 5 or more servings of fruits and  vegetables.  Biometrics:  Pre Biometrics - 06/10/20 0935      Pre Biometrics   Waist Circumference 41.25 inches    Hip Circumference 40.75 inches    Waist to Hip Ratio 1.01 %    Triceps Skinfold 17 mm    % Body Fat 31.3 %    Grip Strength 37 kg    Flexibility 0 in    Single Leg Stand 5.25 seconds            Nutrition Therapy Plan and Nutrition Goals:   Nutrition Assessments:   Nutrition Goals Re-Evaluation:   Nutrition Goals Re-Evaluation:   Nutrition Goals Discharge (Final Nutrition Goals Re-Evaluation):   Psychosocial: Target Goals: Acknowledge presence or absence of significant depression and/or stress, maximize coping skills, provide positive support system. Participant is able to verbalize types and ability to use techniques and skills needed for reducing stress and depression.  Initial Review & Psychosocial Screening:  Initial Psych Review & Screening - 06/10/20 1005      Initial Review   Current issues with None Identified      Family Dynamics   Good Support System? Yes   strong Christian faith, wife, son   Comments Mr. Arzola presents to chadiac rehab with a positive attitude and outlook. Has very strong Christian faith. He lives wife. He states he has no stress as his faith in [place] allows him not to worry about "anything". He enjoys spending time in nature and doing yard work. No interventions needed at this time.      Barriers   Psychosocial barriers to participate in program There are no identifiable barriers or psychosocial needs.      Screening Interventions   Interventions Encouraged to exercise           Quality of Life Scores:  Quality of Life - 06/10/20 1428      Quality of Life   Select Quality of Life      Quality of Life Scores   Health/Function Pre 30 %    Socioeconomic Pre 30 %    Psych/Spiritual Pre 30 %    Family Pre 30 %    GLOBAL Pre 30 %          Scores of 19 and below usually indicate a poorer quality of life in  these areas.  A difference of  2-3 points is a clinically meaningful difference.  A difference of 2-3 points in the total score of the Quality of Life Index has been associated with significant improvement in overall quality of life, self-image, physical symptoms, and general health in studies assessing change in quality of life.  PHQ-9: Recent Review Flowsheet Data    Depression screen [facility] 2/9 06/10/2020 06/10/2020 02/10/2018 03/02/2017 09/01/2016   Decreased Interest 0 0 0 0 0   Down, Depressed, Hopeless 0 0 0 0 0   PHQ - 2 Score 0 0 0 0 0  Altered sleeping 1 - 1 - -   Tired, decreased energy 1 - 1 - -   Change in appetite 0 - 0 - -   Feeling bad or failure about yourself  0 - 1 - -   Trouble concentrating 0 - 0 - -   Moving slowly or fidgety/restless 0 - 0 - -   Suicidal thoughts 0 - 0 - -   PHQ-9 Score 2 - 3 - -     Interpretation of Total Score  Total Score Depression Severity:  1-4 = Minimal depression, 5-9 = Mild depression, 10-14 = Moderate depression, 15-19 = Moderately severe depression, 20-27 = Severe depression   Psychosocial Evaluation and Intervention:   Psychosocial Re-Evaluation:   Psychosocial Discharge (Final Psychosocial Re-Evaluation):   Vocational Rehabilitation: Provide vocational rehab assistance to qualifying candidates.   Vocational Rehab Evaluation & Intervention:   Education: Education Goals: Education classes will be provided on a weekly basis, covering required topics. Participant will state understanding/return demonstration of topics presented.  Learning Barriers/Preferences:   Education Topics: Count Your Pulse:  -Group instruction provided by verbal instruction, demonstration, patient participation and written materials to support subject.  Instructors address importance of being able to find your pulse and how to count your pulse when at home without a heart monitor.  Patients get hands on experience counting their pulse with staff help and  individually.   Heart Attack, Angina, and Risk Factor Modification:  -Group instruction provided by verbal instruction, video, and written materials to support subject.  Instructors address signs and symptoms of angina and heart attacks.    Also discuss risk factors for heart disease and how to make changes to improve heart health risk factors.   Functional Fitness:  -Group instruction provided by verbal instruction, demonstration, patient participation, and written materials to support subject.  Instructors address safety measures for doing things around the house.  Discuss how to get up and down off the floor, how to pick things up properly, how to safely get out of a chair without assistance, and balance training.   Meditation and Mindfulness:  -Group instruction provided by verbal instruction, patient participation, and written materials to support subject.  Instructor addresses importance of mindfulness and meditation practice to help reduce stress and improve awareness.  Instructor also leads participants through a meditation exercise.    Stretching for Flexibility and Mobility:  -Group instruction provided by verbal instruction, patient participation, and written materials to support subject.  Instructors lead participants through series of stretches that are designed to increase flexibility thus improving mobility.  These stretches are additional exercise for major muscle groups that are typically performed during regular warm up and cool down.   Hands Only CPR:  -Group verbal, video, and participation provides a basic overview of AHA guidelines for community CPR. Role-play of emergencies allow participants the opportunity to practice calling for help and chest compression technique with discussion of AED use.   Hypertension: -Group verbal and written instruction that provides a basic overview of hypertension including the most recent diagnostic guidelines, risk factor reduction with  self-care instructions and medication management.    Nutrition I class: Heart Healthy Eating:  -Group instruction provided by PowerPoint slides, verbal discussion, and written materials to support subject matter. The instructor gives an explanation and review of the Therapeutic Lifestyle Changes diet recommendations, which includes a discussion on lipid goals, dietary fat, sodium, fiber, plant stanol/sterol esters, sugar, and the components of a well-balanced, healthy diet.   Nutrition II  class: Lifestyle Skills:  -Group instruction provided by PowerPoint slides, verbal discussion, and written materials to support subject matter. The instructor gives an explanation and review of label reading, grocery shopping for heart health, heart healthy recipe modifications, and ways to make healthier choices when eating out.   Diabetes Question & Answer:  -Group instruction provided by PowerPoint slides, verbal discussion, and written materials to support subject matter. The instructor gives an explanation and review of diabetes co-morbidities, pre- and post-prandial blood glucose goals, pre-exercise blood glucose goals, signs, symptoms, and treatment of hypoglycemia and hyperglycemia, and foot care basics.   Diabetes Blitz:  -Group instruction provided by PowerPoint slides, verbal discussion, and written materials to support subject matter. The instructor gives an explanation and review of the physiology behind type 1 and type 2 diabetes, diabetes medications and rational behind using different medications, pre- and post-prandial blood glucose recommendations and Hemoglobin A1c goals, diabetes diet, and exercise including blood glucose guidelines for exercising safely.    Portion Distortion:  -Group instruction provided by PowerPoint slides, verbal discussion, written materials, and food models to support subject matter. The instructor gives an explanation of serving size versus portion size, changes in  portions sizes over the last 20 years, and what consists of a serving from each food group.   Stress Management:  -Group instruction provided by verbal instruction, video, and written materials to support subject matter.  Instructors review role of stress in heart disease and how to cope with stress positively.     Exercising on Your Own:  -Group instruction provided by verbal instruction, power point, and written materials to support subject.  Instructors discuss benefits of exercise, components of exercise, frequency and intensity of exercise, and end points for exercise.  Also discuss use of nitroglycerin and activating EMS.  Review options of places to exercise outside of rehab.  Review guidelines for sex with heart disease.   Cardiac Drugs I:  -Group instruction provided by verbal instruction and written materials to support subject.  Instructor reviews cardiac drug classes: antiplatelets, anticoagulants, beta blockers, and statins.  Instructor discusses reasons, side effects, and lifestyle considerations for each drug class.   Cardiac Drugs II:  -Group instruction provided by verbal instruction and written materials to support subject.  Instructor reviews cardiac drug classes: angiotensin converting enzyme inhibitors (ACE-I), angiotensin II receptor blockers (ARBs), nitrates, and calcium channel blockers.  Instructor discusses reasons, side effects, and lifestyle considerations for each drug class.   Anatomy and Physiology of the Circulatory System:  Group verbal and written instruction and models provide basic cardiac anatomy and physiology, with the coronary electrical and arterial systems. Review of: AMI, Angina, Valve disease, Heart Failure, Peripheral Artery Disease, Cardiac Arrhythmia, Pacemakers, and the ICD.   Other Education:  -Group or individual verbal, written, or video instructions that support the educational goals of the cardiac rehab program.   Holiday Eating Survival  Tips:  -Group instruction provided by PowerPoint slides, verbal discussion, and written materials to support subject matter. The instructor gives patients tips, tricks, and techniques to help them not only survive but enjoy the holidays despite the onslaught of food that accompanies the holidays.   Knowledge Questionnaire Score:  Knowledge Questionnaire Score - 06/10/20 1428      Knowledge Questionnaire Score   Pre Score 16/28           Core Components/Risk Factors/Patient Goals at Admission:  Personal Goals and Risk Factors at Admission - 06/10/20 0933      Core Components/Risk Factors/Patient  Goals on Admission    Weight Management Obesity;Yes;Weight Loss    Intervention Weight Management/Obesity: Establish reasonable short term and long term weight goals.;Obesity: Provide education and appropriate resources to help participant work on and attain dietary goals.    Admit Weight 196 lb 3.4 oz (89 kg)    Expected Outcomes Short Term: Continue to assess and modify interventions until short term weight is achieved;Long Term: Adherence to nutrition and physical activity/exercise program aimed toward attainment of established weight goal;Weight Loss: Understanding of general recommendations for a balanced deficit meal plan, which promotes 1-2 lb weight loss per week and includes a negative energy balance of [phone] kcal/d;Understanding recommendations for meals to include 15-35% energy as protein, 25-35% energy from fat, 35-60% energy from carbohydrates, less than 200mg  of dietary cholesterol, 20-35 gm of total fiber daily;Understanding of distribution of calorie intake throughout the day with the consumption of 4-5 meals/snacks    Tobacco Cessation Yes    Number of packs per day 0.5    Intervention Assist the participant in steps to quit. Provide individualized education and counseling about committing to Tobacco Cessation, relapse prevention, and pharmacological support that can be provided by  physician.; , assist with locating and accessing local/national Quit Smoking programs, and support quit date choice.    Expected Outcomes Short Term: Will demonstrate readiness to quit, by selecting a quit date.;Short Term: Will quit all tobacco product use, adhering to prevention of relapse plan.;Long Term: Complete abstinence from all tobacco products for at least 12 months from quit date.    Diabetes Yes    Intervention Provide education about signs/symptoms and action to take for hypo/hyperglycemia.;Provide education about proper nutrition, including hydration, and aerobic/resistive exercise prescription along with prescribed medications to achieve blood glucose in normal ranges: Fasting glucose 65-99 mg/dL    Expected Outcomes Short Term: Participant verbalizes understanding of the signs/symptoms and immediate care of hyper/hypoglycemia, proper foot care and importance of medication, aerobic/resistive exercise and nutrition plan for blood glucose control.;Long Term: Attainment of HbA1C < 7%.    Hypertension Yes    Intervention Provide education on lifestyle modifcations including regular physical activity/exercise, weight management, moderate sodium restriction and increased consumption of fresh fruit, vegetables, and low fat dairy, alcohol moderation, and smoking cessation.;Monitor prescription use compliance.    Expected Outcomes Short Term: Continued assessment and intervention until BP is < 140/72mm HG in hypertensive participants. < 130/93mm HG in hypertensive participants with diabetes, heart failure or chronic kidney disease.;Long Term: Maintenance of blood pressure at goal levels.    Lipids Yes    Intervention Provide education and support for participant on nutrition & aerobic/resistive exercise along with prescribed medications to achieve LDL 70mg , HDL >40mg .    Expected Outcomes Short Term: Participant states understanding of desired cholesterol values and is  compliant with medications prescribed. Participant is following exercise prescription and nutrition guidelines.;Long Term: Cholesterol controlled with medications as prescribed, with individualized exercise RX and with personalized nutrition plan. Value goals: LDL < 70mg , HDL > 40 mg.           Core Components/Risk Factors/Patient Goals Review:    Core Components/Risk Factors/Patient Goals at Discharge (Final Review):    ITP Comments:  ITP Comments    Row Name 06/10/20 0905           ITP Comments Dr. , Medical Director Cardiac Rehab 06/12/20              Comments: Patient attended orientation on 06/10/2020 to review rules  and guidelines for program.  Completed 6 minute walk test, Intitial ITP, and exercise prescription.  VSS. Telemetry-SB/NSR with inverted T wave.  Asymptomatic. Safety measures and social distancing in place per CDC guidelines. Patient has provided consent to do virtual cardiac rehab however declines to utilize the [employer] app. He prefer weekly phone calls to follow up on exercise progression and education/nurse follow up.

## 2020-06-10 NOTE — Progress Notes (Signed)
         Confirm Consent - In the setting of the current Covid19 crisis, you are scheduled for a phone visit with your Cardiac or Pulmonary team member.  Just as we do with many in-gym visits, in order for you to participate in this visit, we must obtain consent.  If you'd like, I can send this to your mychart (if signed up) or email for you to review.  Otherwise, I can obtain your verbal consent now.  By agreeing to a telephone visit, we'd like you to understand that the technology does not allow for your Cardiac or Pulmonary Rehab team member to perform a physical assessment, and thus may limit their ability to fully assess your ability to perform exercise programs. If your provider identifies any concerns that need to be evaluated in person, we will make arrangements to do so.  Finally, though the technology is pretty good, we cannot assure that it will always work on either your or our end and we cannot ensure that we have a secure connection.  Cardiac and Pulmonary Rehab Telehealth visits and "At Home" cardiac and pulmonary rehab are provided at no cost to you.        Are you willing to proceed?"        STAFF: Did the patient verbally acknowledge consent to telehealth visit? Document YES/NO here: Yes     [name] E. Suzie Portela, RN  Cardiac and Pulmonary Rehab Staff        Date 06/10/21 @ Time 10:00

## 2020-06-10 NOTE — Progress Notes (Signed)
Patient plans to participate in the virtual cardiac rehab program. Reviewed home exercise guidelines with patient including endpoints, temperature precautions, target heart rate and rate of perceived exertion. Patient is currently walking 3-4 minutes as his mode of home exercise. Discussed increasing walking duration and patient is amenable to this. Patient will start walking 5 minutes twice/day adding a minute each day until reaching 15 minutes twice/day. Once reaching 15 minutes BID, patient will combine into 30 minutes daily walking. Patient voices understanding of instructions given. Will check progress with bi-weekly phone calls.  Artist Pais, MS, ACSM CEP

## 2020-06-10 NOTE — Patient Instructions (Signed)
Diabetes Mellitus and Foot Care Foot care is an important part of your health, especially when you have diabetes. Diabetes may cause you to have problems because of poor blood flow (circulation) to your feet and legs, which can cause your skin to:  Become thinner and drier.  Break more easily.  Heal more slowly.  Peel and crack. You may also have nerve damage (neuropathy) in your legs and feet, causing decreased feeling in them. This means that you may not notice minor injuries to your feet that could lead to more serious problems. Noticing and addressing any potential problems early is the best way to prevent future foot problems. How to care for your feet Foot hygiene  Wash your feet daily with warm water and mild soap. Do not use hot water. Then, pat your feet and the areas between your toes until they are completely dry. Do not soak your feet as this can dry your skin.  Trim your toenails straight across. Do not dig under them or around the cuticle. File the edges of your nails with an emery board or nail file.  Apply a moisturizing lotion or petroleum jelly to the skin on your feet and to dry, brittle toenails. Use lotion that does not contain alcohol and is unscented. Do not apply lotion between your toes. Shoes and socks  Wear clean socks or stockings every day. Make sure they are not too tight. Do not wear knee-high stockings since they may decrease blood flow to your legs.  Wear shoes that fit properly and have enough cushioning. Always look in your shoes before you put them on to be sure there are no objects inside.  To break in new shoes, wear them for just a few hours a day. This prevents injuries on your feet. Wounds, scrapes, corns, and calluses  Check your feet daily for blisters, cuts, bruises, sores, and redness. If you cannot see the bottom of your feet, use a mirror or ask someone for help.  Do not cut corns or calluses or try to remove them with medicine.  If you  find a minor scrape, cut, or break in the skin on your feet, keep it and the skin around it clean and dry. You may clean these areas with mild soap and water. Do not clean the area with peroxide, alcohol, or iodine.  If you have a wound, scrape, corn, or callus on your foot, look at it several times a day to make sure it is healing and not infected. Check for: ? Redness, swelling, or pain. ? Fluid or blood. ? Warmth. ? Pus or a bad smell. General instructions  Do not cross your legs. This may decrease blood flow to your feet.  Do not use heating pads or hot water bottles on your feet. They may burn your skin. If you have lost feeling in your feet or legs, you may not know this is happening until it is too late.  Protect your feet from hot and cold by wearing shoes, such as at the beach or on hot pavement.  Schedule a complete foot exam at least once a year (annually) or more often if you have foot problems. If you have foot problems, report any cuts, sores, or bruises to your health care provider immediately. Contact a health care provider if:  You have a medical condition that increases your risk of infection and you have any cuts, sores, or bruises on your feet.  You have an injury that is not   healing.  You have redness on your legs or feet.  You feel burning or tingling in your legs or feet.  You have pain or cramps in your legs and feet.  Your legs or feet are numb.  Your feet always feel cold.  You have pain around a toenail. Get help right away if:  You have a wound, scrape, corn, or callus on your foot and: ? You have pain, swelling, or redness that gets worse. ? You have fluid or blood coming from the wound, scrape, corn, or callus. ? Your wound, scrape, corn, or callus feels warm to the touch. ? You have pus or a bad smell coming from the wound, scrape, corn, or callus. ? You have a fever. ? You have a red line going up your leg. Summary  Check your feet every day  for cuts, sores, red spots, swelling, and blisters.  Moisturize feet and legs daily.  Wear shoes that fit properly and have enough cushioning.  If you have foot problems, report any cuts, sores, or bruises to your health care provider immediately.  Schedule a complete foot exam at least once a year (annually) or more often if you have foot problems. This information is not intended to replace advice given to you by your health care provider. Make sure you discuss any questions you have with your health care provider. Document Revised: 05/30/2019 Document Reviewed: 10/08/2016 Elsevier Patient Education  2020 Elsevier Inc.  

## 2020-06-13 ENCOUNTER — Other Ambulatory Visit: Payer: Self-pay

## 2020-06-13 ENCOUNTER — Encounter (HOSPITAL_COMMUNITY)
Admission: RE | Admit: 2020-06-13 | Discharge: 2020-06-13 | Disposition: A | Discharge status: Home / Self Care | Payer: Self-pay | Source: Ambulatory Visit | Attending: Cardiovascular Disease | Admitting: Cardiovascular Disease

## 2020-06-13 NOTE — Progress Notes (Signed)
Jonathan Miller. 64 y.o. male Nutrition Note   Visit Diagnosis: Acute ST elevation myocardial infarction (STEMI) involving right coronary artery [facility])  Status post coronary artery stent placement   Past Medical History:  Diagnosis Date  . Arthritis    "right shoulder" (02/09/2016)  . Bell's palsy 01/2012; 02/09/2016  . CAD (coronary artery disease)    a. inferior STEMI 04/2020 s/p DES to mRCA, EF 45-50% by echo.  . Daily headache    "recently" (02/09/2016)  . GERD (gastroesophageal reflux disease)   . Hiatal hernia   . History of hiatal hernia   . HTN (hypertension)   . Hyperlipidemia   . Hypertension   . Ischemic cardiomyopathy   . Obesity   . PAD (peripheral artery disease) (HCC)   . Renal insufficiency    noted 04/2020 admit  . STEMI (ST elevation myocardial infarction) (HCC)   . Tobacco abuse   . Tobacco use   . Type 2 diabetes mellitus (HCC)   . Type II diabetes mellitus (HCC)      Medications reviewed.   Current Outpatient Medications:  .  aspirin EC 81 MG tablet, Take 1 tablet (81 mg total) by mouth daily. Swallow whole., Disp: 30 tablet, Rfl: 11 .  atorvastatin (LIPITOR) 80 MG tablet, Take 1 tablet (80 mg total) by mouth daily., Disp: 30 tablet, Rfl: 6 .  insulin NPH-regular Human (NOVOLIN 70/30) (70-30) 100 UNIT/ML injection, Inject 10 Units into the skin 2 (two) times daily with a meal., Disp: 30 mL, Rfl: 6 .  Insulin Pen Needle (PEN NEEDLES) 32G X 4 MM MISC, 1 each by Does not apply route 2 (two) times daily., Disp: 100 each, Rfl: 2 .  lisinopril (ZESTRIL) 5 MG tablet, Take 1 tablet (5 mg total) by mouth daily., Disp: 30 tablet, Rfl: 6 .  metoprolol tartrate (LOPRESSOR) 25 MG tablet, Take 1 tablet (25 mg total) by mouth 2 (two) times daily., Disp: 60 tablet, Rfl: 6 .  nicotine (NICODERM CQ - DOSED IN MG/24 HOURS) 14 mg/24hr patch, Place a patch (14 mg) on your skin daily for 6 weeks (Patient not taking: Reported on 06/10/2020), Disp: 42 patch, Rfl: 0 .  nicotine  (NICODERM CQ - DOSED IN MG/24 HR) 7 mg/24hr patch, Place a patch on your skin daily for 14 days. (Patient not taking: Reported on 06/10/2020), Disp: 14 patch, Rfl: 0 .  nitroGLYCERIN (NITROSTAT) 0.4 MG SL tablet, Place 1 tablet (0.4 mg total) under the tongue every 5 (five) minutes as needed for chest pain (up to 3 doses max)., Disp: 25 tablet, Rfl: 3 .  pantoprazole (PROTONIX) 40 MG tablet, Take 1 tablet (40 mg total) by mouth daily., Disp: 30 tablet, Rfl: 11 .  ticagrelor (BRILINTA) 90 MG TABS tablet, Take 1 tablet (90 mg total) by mouth 2 (two) times daily., Disp: 60 tablet, Rfl: 6   Ht Readings from Last 1 Encounters:  06/10/20 5\' 4"  (1.626 m)     Wt Readings from Last 3 Encounters:  06/10/20 196 lb 3.4 oz (89 kg)  06/10/20 197 lb (89.4 kg)  05/19/20 198 lb 9.6 oz (90.1 kg)     There is no height or weight on file to calculate BMI.   Social History   Tobacco Use  Smoking Status Current Every Day Smoker  . Packs/day: 0.50  . Years: 50.00  . Pack years: 25.00  . Types: Cigarettes  Smokeless Tobacco Never Used  Tobacco Comment   02/09/2016 "chewed from age [age]"  Lab Results  Component Value Date   CHOL 229 (H) 05/06/2020   Lab Results  Component Value Date   HDL 48 05/06/2020   Lab Results  Component Value Date   LDLCALC 121 (H) 05/06/2020   Lab Results  Component Value Date   TRIG 298 (H) 05/06/2020     Lab Results  Component Value Date   HGBA1C 11.8 (H) 05/06/2020     CBG (last 3)  No results for input(s): GLUCAP in the last 72 hours.   Nutrition Note  Spoke with pt. Nutrition Plan and Nutrition Survey goals reviewed with pt. Pt is following a Heart Healthy diet. He is staying away from desserts, breads, and white pasta/rice/crackers. He is choosing whole grain bread, healthy fats, fruits, and veggies for most meals and snacks.   Pt has Type 2 Diabetes. Pt checks CBG's 2 times a day. Fasting CBG's reportedly 100-150 mg/dL. He has been taking his  insulin and his CBGs have decreased. His goal is to keep most CBGs around 100 mg/dl. He denies hypoglycemia. Reviewed hypoglycemia protocol.  Pt expressed understanding of the information reviewed.    Nutrition Diagnosis ? Excessive carbohydrate intake related to food preferences and lack of food related knowledge as evidenced by A1C 11.8   Nutrition Intervention ? Pt's individual nutrition plan reviewed with pt. ? Benefits of adopting Heart Healthy diet discussed when Medficts reviewed.   ? Continue client-centered nutrition education by RD, as part of interdisciplinary care.  Goal(s) ? Pt to build a healthy plate including vegetables, fruits, whole grains, and low-fat dairy products in a heart healthy meal plan. ? CBG concentrations in the normal range or as close to normal as is safely possible. ? Improved blood glucose control as evidenced by pt's A1c trending from 11.3 toward less than 7.0.  Plan:   Will provide client-centered nutrition education as part of interdisciplinary care  Monitor and evaluate progress toward nutrition goal with team.   Andrey Campanile, MS, RDN, LDN

## 2020-06-19 ENCOUNTER — Telehealth (HOSPITAL_COMMUNITY): Payer: Self-pay

## 2020-06-19 NOTE — Telephone Encounter (Signed)
Virtual Cardiac Rehab Note:  Unsuccessful telephone call to SAJID, RUPPERT to follow up on virtual cardiac rehab exercise and plan of care to reduce CAD risk factors. Hipaa compliant VM message left requesting call back to [phone].   Tylene Quashie E. Suzie Portela RN, BSN [place]. [facility]  Cardiac and Pulmonary Rehabilitation Phone: [phone] Fax: [phone]

## 2020-06-25 ENCOUNTER — Encounter (HOSPITAL_COMMUNITY)
Admission: RE | Admit: 2020-06-25 | Discharge: 2020-06-25 | Disposition: A | Discharge status: Home / Self Care | Payer: Self-pay | Source: Ambulatory Visit | Attending: Cardiovascular Disease | Admitting: Cardiovascular Disease

## 2020-06-25 NOTE — Progress Notes (Signed)
Cardiac Rehab: Virtual Visit  Patient participates in the virtual cardiac rehab program. Called patient to check progress with exercise. At orientation, patient was walking 3-4 minutes daily, and we discussed increasing duration by adding a minute each session and increasing from once daily to twice daily. Patient is currently walking 8 minutes BID on treadmill at 3.0 mph and tolerating well with the only complaint being some leg soreness. I inquired if patient is stretching, and patient states that he is stretching before and after exercise. Patient has 2 lb. hand weights that he's using BID for his resistance training. Patient feels like he's doing  a little better and walking faster. Patient is adding a minute each session depending on how he feels that day. If patient feels weak, he just walks 6-7 minutes, patient states he doesn't want to push it.  Patient's goal is to quit smoking, and he states he hasn't had a cigarette since yesterday. Patient does have the patch to help with tobacco cessation.   Patient states that he occasionally feels dizzy when brings his head down from looking up, but the feeling passes quickly. I suggested patient discuss with his physician if this continues to occur. Patient voices understanding of information given. Will follow-up in 2 weeks.  Artist Pais, MS, ACSM [employer] 06/25/2020 [phone]

## 2020-07-04 ENCOUNTER — Other Ambulatory Visit: Payer: Self-pay | Admitting: Student

## 2020-07-04 NOTE — Telephone Encounter (Signed)
*  STAT* If patient is at the pharmacy, call can be transferred to refill team.   1. Which medications need to be refilled? (please list name of each medication and dose if known)  metoprolol tartrate (LOPRESSOR) 25 MG tablet  2. Which pharmacy/location (including street and city if local pharmacy) is medication to be sent to? [id number] - [place], [place] - [address]  3. Do they need a 30 day or 90 day supply? 30  Patient gets all of his other medications from [facility]. This one was initially sent to the [facility] pharmacy but would like it to go to [facility] instead   Pt will take last dose of medication on Monday 07/07/20

## 2020-07-07 ENCOUNTER — Other Ambulatory Visit: Payer: Self-pay | Admitting: Cardiovascular Disease

## 2020-07-07 MED FILL — METOPROLOL TARTRATE 25 MG T: 25 | 30 days supply | Qty: 60 | Fill #1

## 2020-07-07 NOTE — Telephone Encounter (Signed)
*  STAT* If patient is at the pharmacy, call can be transferred to refill team.   1. Which medications need to be refilled? (please list name of each medication and dose if known) metoprolol tartrate (LOPRESSOR) 25 MG tablet  2. Which pharmacy/location (including street and city if local pharmacy) is medication to be sent to? [id number] - [place], [place] - [address]  3. Do they need a 30 day or 90 day supply? 90 day supply  Patient is completely out of medication.

## 2020-07-22 ENCOUNTER — Telehealth (HOSPITAL_COMMUNITY): Payer: Self-pay | Admitting: *Deleted

## 2020-07-22 NOTE — Telephone Encounter (Signed)
Patient participates in the virtual cardiac rehab program. Called patient to check progress with exercise. No answer, left message on voicemail.  Artist Pais, MS, ACSM CEP

## 2020-08-01 ENCOUNTER — Encounter (HOSPITAL_COMMUNITY)
Admission: RE | Admit: 2020-08-01 | Discharge: 2020-08-01 | Disposition: A | Discharge status: Home / Self Care | Payer: Self-pay | Source: Ambulatory Visit | Attending: Cardiovascular Disease | Admitting: Cardiovascular Disease

## 2020-08-01 NOTE — Progress Notes (Signed)
Cardiac Rehab: Virtual Visit  Patient participates in the virtual cardiac rehab program. Contacted patient to check progress with exercise. Patient is walking on his treadmill daily without any problems. Patient is currently walking 5-8 minutes twice/day at 3 mph. I asked if there's a reason that patient limits his walking duration, and he doesn't have a particular reason, he does like to walk twice/day instead of once. I suggested patient increase duration from 5-8 minutes BID to 10 minutes BID to get closer to achieving 150 minutes of aerobic exercise per week, and patient is amenable to this. I suggested patient decrease speed if necessary, to accommodate longer duration. Patient is stretching every day prior to walking and using his 2 lb. weights for resistance training 5 days/week. Patient states that he's no longer having any dizziness with walking. Patient also states that his energy level is a little better.   Patient is still smoking at this time. He uses a nicotine patch but doesn't feel like it's helping. Patient has decreased tobacco use to 5-6 cigarettes/day and states that sometimes he just takes a few puffs of a cigarette. I encouraged patient to call the 1800QUITNOW line for additional assistance with tobacco cessation or talk with his physician.   Patient's only question for me was in regard to the bread he eats with his meals, which is "[employer] killer bread 21 whole grains and seeds". I asked our dietitian if this was a good choice for Mr. Stadel, and she said that it is. Patient has no other questions or concerns at this time.  Patient voices understanding of information reviewed. Will follow-up with patient in the next couple of weeks.  Artist Pais, MS, ACSM CEP 08/01/2020 [phone]

## 2020-08-04 MED FILL — METOPROLOL TARTRATE 25 MG T: 25 | 30 days supply | Qty: 60 | Fill #2

## 2020-08-26 ENCOUNTER — Telehealth (HOSPITAL_COMMUNITY): Payer: Self-pay | Admitting: *Deleted

## 2020-08-26 NOTE — Telephone Encounter (Signed)
Patient participates in the virtual cardiac rehab program and is walking on his treadmill at home without issue. Virtual program is scheduled to conclude this month. I called patient to schedule follow-up walk test, and patient is scheduled to come Tuesday, 09/02/20 for his post walk test and measurements. Will send exit surveys to be completed.  Artist Pais, MS, ACSM CEP 12/7/22021 1313

## 2020-08-31 ENCOUNTER — Emergency Department (HOSPITAL_COMMUNITY): Payer: Self-pay

## 2020-08-31 ENCOUNTER — Other Ambulatory Visit: Payer: Self-pay

## 2020-08-31 ENCOUNTER — Emergency Department (HOSPITAL_COMMUNITY)
Admission: EM | Admit: 2020-08-31 | Discharge: 2020-09-01 | Disposition: A | Discharge status: Home / Self Care | Payer: Self-pay | Attending: Emergency Medicine | Admitting: Emergency Medicine

## 2020-08-31 ENCOUNTER — Encounter (HOSPITAL_COMMUNITY): Payer: Self-pay | Admitting: Emergency Medicine

## 2020-08-31 DIAGNOSIS — R072 Precordial pain: Secondary | ICD-10-CM | POA: Insufficient documentation

## 2020-08-31 DIAGNOSIS — Z79899 Other long term (current) drug therapy: Secondary | ICD-10-CM | POA: Insufficient documentation

## 2020-08-31 DIAGNOSIS — I2510 Atherosclerotic heart disease of native coronary artery without angina pectoris: Secondary | ICD-10-CM | POA: Insufficient documentation

## 2020-08-31 DIAGNOSIS — I5041 Acute combined systolic (congestive) and diastolic (congestive) heart failure: Secondary | ICD-10-CM | POA: Insufficient documentation

## 2020-08-31 DIAGNOSIS — E119 Type 2 diabetes mellitus without complications: Secondary | ICD-10-CM | POA: Insufficient documentation

## 2020-08-31 DIAGNOSIS — Z794 Long term (current) use of insulin: Secondary | ICD-10-CM | POA: Insufficient documentation

## 2020-08-31 DIAGNOSIS — Z7901 Long term (current) use of anticoagulants: Secondary | ICD-10-CM | POA: Insufficient documentation

## 2020-08-31 DIAGNOSIS — F17210 Nicotine dependence, cigarettes, uncomplicated: Secondary | ICD-10-CM | POA: Insufficient documentation

## 2020-08-31 DIAGNOSIS — Z7982 Long term (current) use of aspirin: Secondary | ICD-10-CM | POA: Insufficient documentation

## 2020-08-31 DIAGNOSIS — I110 Hypertensive heart disease with heart failure: Secondary | ICD-10-CM | POA: Insufficient documentation

## 2020-08-31 LAB — CBC
HCT: 39.5 % (ref 39.0–52.0)
Hemoglobin: 12.8 g/dL — ABNORMAL LOW (ref 13.0–17.0)
MCH: 30.7 pg (ref 26.0–34.0)
MCHC: 32.4 g/dL (ref 30.0–36.0)
MCV: 94.7 fL (ref 80.0–100.0)
Platelets: 309 10*3/uL (ref 150–400)
RBC: 4.17 MIL/uL — ABNORMAL LOW (ref 4.22–5.81)
RDW: 14.3 % (ref 11.5–15.5)
WBC: 11.6 10*3/uL — ABNORMAL HIGH (ref 4.0–10.5)
nRBC: 0 % (ref 0.0–0.2)

## 2020-08-31 LAB — BASIC METABOLIC PANEL
Anion gap: 8 (ref 5–15)
BUN: 33 mg/dL — ABNORMAL HIGH (ref 8–23)
CO2: 21 mmol/L — ABNORMAL LOW (ref 22–32)
Calcium: 9.2 mg/dL (ref 8.9–10.3)
Chloride: 110 mmol/L (ref 98–111)
Creatinine, Ser: 1.13 mg/dL (ref 0.61–1.24)
GFR, Estimated: >60 mL/min (ref >60)
Glucose, Bld: 139 mg/dL — ABNORMAL HIGH (ref 70–99)
Potassium: 4.4 mmol/L (ref 3.5–5.1)
Sodium: 139 mmol/L (ref 135–145)

## 2020-08-31 LAB — PROTIME-INR
INR: 1 (ref 0.8–1.2)
Prothrombin Time: 12.4 seconds (ref 11.4–15.2)

## 2020-08-31 LAB — TROPONIN I (HIGH SENSITIVITY): Troponin I (High Sensitivity): 13 ng/L (ref <18)

## 2020-08-31 NOTE — ED Triage Notes (Addendum)
Patient arrived with EMS from home reports intermittent central chest pain with mild SOB this evening , no emesis or diaphoresis , history of CAD /Coronary stents. He took his own ASA 324 mg prior to arrival .

## 2020-09-01 ENCOUNTER — Telehealth (HOSPITAL_COMMUNITY): Payer: Self-pay | Admitting: Family Medicine

## 2020-09-01 LAB — TROPONIN I (HIGH SENSITIVITY): Troponin I (High Sensitivity): 14 ng/L (ref <18)

## 2020-09-01 NOTE — ED Notes (Signed)
Verified with lab that they received second troponin.

## 2020-09-01 NOTE — ED Notes (Signed)
Patient verbalizes understanding of discharge instructions. Opportunity for questioning and answers were provided. Armband removed by staff, pt discharged from ED ambulatory.   

## 2020-09-01 NOTE — ED Provider Notes (Signed)
[facility] EMERGENCY DEPARTMENT Provider Note   CSN: [id number] Arrival date & time: 08/31/20  2234     History Chief Complaint  Patient presents with  . Chest Pain    Hx: CAD/Stents    Jonathan Miller. is a 64 y.o. male.  Patient presents to the emergency department with a chief complaint of chest pain.  He states that he had a very brief episode of chest pain that lasted less than 1 minute at approximately 7 PM tonight.  He has had no further symptoms.  He denies any associated shortness of breath or radiating pain.  He does have significant cardiac history including STEMI in 04/2020.  He underwent coronary artery graft revascularization by Dr. Gery Pray.  He states that he has follow-up in a few weeks with his cardiologist.  The history is provided by the patient. No language interpreter was used.       Past Medical History:  Diagnosis Date  . Arthritis    "right shoulder" (02/09/2016)  . Bell's palsy 01/2012; 02/09/2016  . CAD (coronary artery disease)    a. inferior STEMI 04/2020 s/p DES to mRCA, EF 45-50% by echo.  . Daily headache    "recently" (02/09/2016)  . GERD (gastroesophageal reflux disease)   . Hiatal hernia   . History of hiatal hernia   . HTN (hypertension)   . Hyperlipidemia   . Hypertension   . Ischemic cardiomyopathy   . Obesity   . PAD (peripheral artery disease) (HCC)   . Renal insufficiency    noted 04/2020 admit  . STEMI (ST elevation myocardial infarction) (HCC)   . Tobacco abuse   . Tobacco use   . Type 2 diabetes mellitus (HCC)   . Type II diabetes mellitus [facility])     Patient Active Problem List   Diagnosis Date Noted  . Ischemic cardiomyopathy 05/07/2020  . Acute combined systolic and diastolic heart failure (HCC) 05/07/2020  . Essential hypertension 05/07/2020  . AKI (acute kidney injury) (HCC) 05/07/2020  . Hyperlipidemia LDL goal <70 05/07/2020  . Uncontrolled diabetes mellitus (HCC) 05/07/2020  . Tobacco abuse  05/07/2020  . PAD (peripheral artery disease) (HCC) 05/07/2020  . Acute ST elevation myocardial infarction (STEMI) involving right coronary artery (HCC) 05/06/2020  . Hyperlipidemia 11/30/2016  . 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 Bell's palsy 02/09/2016  . Diabetes (HCC) 02/09/2016  . Obesity 02/09/2016  . Non compliance w medication regimen 02/09/2016  . Cerebrovascular accident (CVA) (HCC)   . Diabetes mellitus with complication (HCC)   . Essential hypertension   . Hyperglycemia   . CVA (cerebral infarction) 01/21/2012  . Headache 01/21/2012  . Tobacco abuse 01/21/2012    Past Surgical History:  Procedure Laterality Date  . CORONARY/GRAFT ACUTE MI REVASCULARIZATION N/A 05/06/2020   Procedure: Coronary/Graft Acute MI Revascularization;  Surgeon: Runell Gess, MD;  Location: [facility] INVASIVE CV LAB;  Service: Cardiovascular;  Laterality: N/A;  . LEFT HEART CATH AND CORONARY ANGIOGRAPHY N/A 05/06/2020   Procedure: LEFT HEART CATH AND CORONARY ANGIOGRAPHY;  Surgeon: Runell Gess, MD;  Location: [facility] INVASIVE CV LAB;  Service: Cardiovascular;  Laterality: N/A;  . NO PAST SURGERIES         Family History  Family history unknown: Yes    Social History   Tobacco Use  . Smoking status: Current Every Day Smoker    Packs/day: 0.50    Years: 50.00    Pack years: 25.00    Types: Cigarettes  .  Smokeless tobacco: Never Used  . Tobacco comment: 02/09/2016 "chewed from age [age]"  Substance Use Topics  . Alcohol use: Yes    Comment: every 2-3 weeks  . Drug use: Never    Home Medications Prior to Admission medications   Medication Sig Start Date End Date Taking? Authorizing Provider  aspirin EC 81 MG tablet Take 1 tablet (81 mg total) by mouth daily. Swallow whole. 05/07/20   Dunn, Tacey Ruiz, PA-C  atorvastatin (LIPITOR) 80 MG tablet Take 1 tablet (80 mg total) by mouth daily. 06/03/20   Marjie Skiff E, PA-C  insulin NPH-regular Human (NOVOLIN 70/30) (70-30) 100 UNIT/ML  injection Inject 10 Units into the skin 2 (two) times daily with a meal. 06/10/20   Hoy Register, MD  Insulin Pen Needle (PEN NEEDLES) 32G X 4 MM MISC 1 each by Does not apply route 2 (two) times daily. 05/07/20   Dunn, Tacey Ruiz, PA-C  lisinopril (ZESTRIL) 5 MG tablet Take 1 tablet (5 mg total) by mouth daily. 06/03/20   Marjie Skiff E, PA-C  metoprolol tartrate (LOPRESSOR) 25 MG tablet Take 1 tablet (25 mg total) by mouth 2 (two) times daily. 05/07/20   Dunn, Tacey Ruiz, PA-C  nicotine (NICODERM CQ - DOSED IN MG/24 HOURS) 14 mg/24hr patch Place a patch (14 mg) on your skin daily for 6 weeks Patient not taking: Reported on 06/10/2020 05/19/20   Corrin Parker, PA-C  nicotine (NICODERM CQ - DOSED IN MG/24 HR) 7 mg/24hr patch Place a patch on your skin daily for 14 days. Patient not taking: Reported on 06/10/2020 05/19/20   Corrin Parker, PA-C  nitroGLYCERIN (NITROSTAT) 0.4 MG SL tablet Place 1 tablet (0.4 mg total) under the tongue every 5 (five) minutes as needed for chest pain (up to 3 doses max). 05/07/20   Dunn, Tacey Ruiz, PA-C  pantoprazole (PROTONIX) 40 MG tablet Take 1 tablet (40 mg total) by mouth daily. 05/19/20   Corrin Parker, PA-C  ticagrelor (BRILINTA) 90 MG TABS tablet Take 1 tablet (90 mg total) by mouth 2 (two) times daily. 06/03/20   Corrin Parker, PA-C    Allergies    Patient has no known allergies.  Review of Systems   Review of Systems  All other systems reviewed and are negative.   Physical Exam Updated Vital Signs BP (!) 146/80   Pulse 66   Temp 98.6 F (37 C) (Oral)   Resp 18   SpO2 95%   Physical Exam Vitals and nursing note reviewed.  Constitutional:      Appearance: He is well-developed and well-nourished.  HENT:     Head: Normocephalic and atraumatic.  Eyes:     Conjunctiva/sclera: Conjunctivae normal.  Cardiovascular:     Rate and Rhythm: Normal rate and regular rhythm.     Heart sounds: No murmur heard.   Pulmonary:     Effort:  Pulmonary effort is normal. No respiratory distress.     Breath sounds: Normal breath sounds.  Abdominal:     Palpations: Abdomen is soft.     Tenderness: There is no abdominal tenderness.  Musculoskeletal:        General: No edema.     Cervical back: Neck supple.  Skin:    General: Skin is warm and dry.  Neurological:     Mental Status: He is alert and oriented to person, place, and time.  Psychiatric:        Mood and Affect: Mood and affect and mood normal.  Behavior: Behavior normal.     ED Results / Procedures / Treatments   Labs (all labs ordered are listed, but only abnormal results are displayed) Labs Reviewed  BASIC METABOLIC PANEL - Abnormal; Notable for the following components:      Result Value   CO2 21 (*)    Glucose, Bld 139 (*)    BUN 33 (*)    All other components within normal limits  CBC - Abnormal; Notable for the following components:   WBC 11.6 (*)    RBC 4.17 (*)    Hemoglobin 12.8 (*)    All other components within normal limits  PROTIME-INR  TROPONIN I (HIGH SENSITIVITY)  TROPONIN I (HIGH SENSITIVITY)    EKG None  Radiology DG Chest 2 View  Result Date: 08/31/2020 CLINICAL DATA:  Chest pain EXAM: CHEST - 2 VIEW COMPARISON:  05/06/2020 FINDINGS: There is hyperinflation of the lungs compatible with COPD. Bibasilar scarring, stable since prior study. Heart is normal size. No effusions. No acute bony abnormality. IMPRESSION: COPD/chronic changes.  No active disease. Electronically Signed   By: Charlett Nose M.D.   On: 08/31/2020 23:00    Procedures Procedures (including critical care time)  Medications Ordered in ED Medications - No data to display  ED Course  I have reviewed the triage vital signs and the nursing notes.  Pertinent labs & imaging results that were available during my care of the patient were reviewed by me and considered in my medical decision making (see chart for details).    MDM Rules/Calculators/A&P                           Patient presents with chest pain chest pain which lasted for less than a minute at approximately 7 PM tonight..  Review of prior visits show hospitalization in August 2021 for STEMI.  DDx includes ACS, PE, pneumothorax, aortic dissection, esophageal rupture, pericarditis, chest wall pain.  I think that based on patient's description of his symptoms and pain that it is likely due to have been ACS related.  The pain lasted for less than a minute.  It was sharp and stabbing.  He has had no further episodes of pain tonight.  He has initial troponin of 13, repeat troponin is 14.   No evidence of pneumothorax on CXR.  Doubt dissection, no mediastinal widening on CXR, no ripping/tearing chest pain, neurovascularly intact.  Doubt pericarditis, no positional changes, or diffuse ST elevations on EKG.  Pain is not reproducible, doubt MSK.  Patient is currently pain free.  Recommend close follow-up with cardiology.  Patient discussed with Dr. Judd Lien, who agrees with plan.  All findings were discussed with patient.  Patient understands and agrees with the plan.    Final Clinical Impression(s) / ED Diagnoses Final diagnoses:  Precordial pain    Rx / DC Orders ED Discharge Orders    None       Roxy Horseman, PA-C 09/01/20 0128    Geoffery Lyons, MD 09/01/20 [phone]

## 2020-09-02 ENCOUNTER — Ambulatory Visit (HOSPITAL_COMMUNITY): Payer: Self-pay

## 2020-09-02 ENCOUNTER — Telehealth (HOSPITAL_COMMUNITY): Payer: Self-pay

## 2020-09-02 NOTE — Telephone Encounter (Signed)
Virtual Based Cardiac Rehab Note:  Successful telephone encounter with Jonathan Miller to reschedule his post VCR program 6 min walk test scheduled for today. Per Jonathan Miller, he recently went to ED for chest discomfort and continues to not feel well although chest discomfort has not returned. Also complains of right sided abdominal pain that started yesterday. Denies changes in bowel pattern, N/V/D. He is encouraged to contact his PCP if abdominal pain worsens or continues beyond 24 hours. He is not currently exercising secondary to not feeling well however he plans to return to walking on the treadmill ASAP. He is eager to reschedule his post VCR 6 min walk test to see if his stamina has improved. Per patient request, he is rescheduled for Tuesday January 11 at 1:30 pm.  Jonathan Nones E. Suzie Portela RN, BSN [facility]  Cardiac and Pulmonary Rehabilitation Phone: [phone] Fax: [phone]

## 2020-09-04 MED FILL — METOPROLOL TARTRATE 25 MG T: 25 | 30 days supply | Qty: 60 | Fill #3

## 2020-09-30 ENCOUNTER — Encounter (HOSPITAL_COMMUNITY): Payer: Self-pay | Admitting: *Deleted

## 2020-09-30 ENCOUNTER — Encounter (HOSPITAL_COMMUNITY): Payer: Self-pay

## 2020-09-30 NOTE — Progress Notes (Signed)
Leta Jungling. has completed the virtual cardiac rehab program and will continue exercise independently at this time. Patient did not make follow-up appointment for exit walk test and measurements scheduled for today. Patient's virtual cardiac rehab report will be scanned to media for review. Artist Pais, MS, ACSM CEP 09/30/2020 1419

## 2020-10-03 ENCOUNTER — Other Ambulatory Visit: Payer: Self-pay

## 2020-10-03 ENCOUNTER — Ambulatory Visit (INDEPENDENT_AMBULATORY_CARE_PROVIDER_SITE_OTHER): Payer: 59 | Admitting: Cardiovascular Disease

## 2020-10-03 ENCOUNTER — Encounter: Payer: Self-pay | Admitting: Cardiovascular Disease

## 2020-10-03 VITALS — BP 110/70 | HR 72 | Ht 64.0 in | Wt 185.0 lb

## 2020-10-03 DIAGNOSIS — I255 Ischemic cardiomyopathy: Secondary | ICD-10-CM

## 2020-10-03 DIAGNOSIS — Z720 Tobacco use: Secondary | ICD-10-CM | POA: Diagnosis not present

## 2020-10-03 DIAGNOSIS — I2510 Atherosclerotic heart disease of native coronary artery without angina pectoris: Secondary | ICD-10-CM | POA: Diagnosis not present

## 2020-10-03 DIAGNOSIS — I10 Essential (primary) hypertension: Secondary | ICD-10-CM | POA: Diagnosis not present

## 2020-10-03 DIAGNOSIS — I739 Peripheral vascular disease, unspecified: Secondary | ICD-10-CM

## 2020-10-03 DIAGNOSIS — E782 Mixed hyperlipidemia: Secondary | ICD-10-CM | POA: Diagnosis not present

## 2020-10-03 NOTE — Assessment & Plan Note (Signed)
History of CAD status post inferior STEMI 05/06/2020.  He was awakened from sleep at midnight with chest pain and called EMS.  His EKG showed inferior C7 elevation.  Brought to [facility] urgently and I performed radial diagnostic cath after I attempted femoral cannulation revealing an occluded right iliac artery.  He had a subtotal mid dominant RCA which I stented using a 3 mm x 18 mm long resolute Onyx drug-eluting stent postdilated with a 3.25 mm balloon.  He remains on aspirin and Brilinta.  He is asymptomatic.

## 2020-10-03 NOTE — Patient Instructions (Signed)
Medication Instructions:  Your physician recommends that you continue on your current medications as directed. Please refer to the Current Medication list given to you today.  *If you need a refill on your cardiac medications before your next appointment, please call your pharmacy*   Lab Work: Your physician recommends that you have labs done today: lipid/liver profile  If you have labs (blood work) drawn today and your tests are completely normal, you will receive your results only by: Marland Kitchen MyChart Message (if you have MyChart) OR . A paper copy in the mail If you have any lab test that is abnormal or we need to change your treatment, we will call you to review the results.   Testing/Procedures: Your physician has requested that you have a lower extremity arterial duplex. This test is an ultrasound of the arteries in the legs. It looks at arterial blood flow in the legs and arms. Allow one hour for Lower Arterial scans. There are no restrictions or special instructions. [address]  Your physician has requested that you have an ankle brachial index (ABI). During this test an ultrasound and blood pressure cuff are used to evaluate the arteries that supply the arms and legs with blood. Allow thirty minutes for this exam. There are no restrictions or special instructions. [address]  Your physician has requested that you have an echocardiogram. Echocardiography is a painless test that uses sound waves to create images of your heart. It provides your doctor with information about the size and shape of your heart and how well your heart's chambers and valves are working. This procedure takes approximately one hour. There are no restrictions for this procedure. [address]   Follow-Up: At [facility], you and your health needs are our priority.  As part of our continuing mission to provide you with exceptional heart care, we have created  designated Provider Care Teams.  These Care Teams include your primary Cardiologist (physician) and Advanced Practice Providers (APPs -  Physician Assistants and Nurse Practitioners) who all work together to provide you with the care you need, when you need it.  We recommend signing up for the patient portal called "MyChart".  Sign up information is provided on this After Visit Summary.  MyChart is used to connect with patients for Virtual Visits (Telemedicine).  Patients are able to view lab/test results, encounter notes, upcoming appointments, etc.  Non-urgent messages can be sent to your provider as well.   To learn more about what you can do with MyChart, go to [email or web address].    Your next appointment:   6 month(s)  The format for your next appointment:   In Person  Provider:   You will see one of the following Advanced Practice Providers on your designated Care Team:    Corine Shelter, PA-C  [place], [place]  Edd Fabian, [place]  Then, Nanetta Batty, MD will plan to see you again in 12 month(s).

## 2020-10-03 NOTE — Progress Notes (Signed)
10/03/2020 Jonathan Miller.   1956/05/21  [id number]  Primary Physician Hoy Register, MD Primary Cardiologist: Runell Gess MD Nicholes Calamity, [place]  HPI:  Eulice Rutledge. is a 65 y.o. mildly overweight married Latino male father of 1 son who recently retired from being an Barrister's clerk.  This is the first outpatient visit since his STEMI 05/06/2020.  His risk factors include 50 pack years of tobacco use continues to smoke 1/2 pack/day as well as treated hypertension hyperlipidemia.  He was awakened at midnight on 05/06/2020 with chest pain.  His EKG showed inferior ST segment elevation.  He is brought urgently to the Cath Lab where I initially accessed his right common femoral demonstrating an occluded right iliac artery.  I then switched to right radial demonstrating a subtotally occluded dominant RCA which I stented with a 3 mm x 18 mm long Medtronic resolute Onyx drug-eluting stent postdilated with a 3.25 mm balloon.  He is remained stable since on dual antiplatelet therapy including aspirin and Brilinta.  He denies chest pain or shortness of breath.  He has minimal claudication.   Current Meds  Medication Sig  . aspirin EC 81 MG tablet Take 1 tablet (81 mg total) by mouth daily. Swallow whole. (Patient taking differently: Take 81 mg by mouth daily. Swallow whole.)  . atorvastatin (LIPITOR) 80 MG tablet Take 1 tablet (80 mg total) by mouth daily.  . insulin NPH-regular Human (NOVOLIN 70/30) (70-30) 100 UNIT/ML injection Inject 10 Units into the skin 2 (two) times daily with a meal.  . Insulin Pen Needle (PEN NEEDLES) 32G X 4 MM MISC 1 each by Does not apply route 2 (two) times daily.  Marland Kitchen lisinopril (ZESTRIL) 5 MG tablet Take 1 tablet (5 mg total) by mouth daily.  . metoprolol tartrate (LOPRESSOR) 25 MG tablet Take 1 tablet (25 mg total) by mouth 2 (two) times daily.  . nicotine (NICODERM CQ - DOSED IN MG/24 HOURS) 14 mg/24hr patch Place a patch (14 mg) on your skin daily for 6  weeks  . nicotine (NICODERM CQ - DOSED IN MG/24 HR) 7 mg/24hr patch Place a patch on your skin daily for 14 days.  . nitroGLYCERIN (NITROSTAT) 0.4 MG SL tablet Place 1 tablet (0.4 mg total) under the tongue every 5 (five) minutes as needed for chest pain (up to 3 doses max).  . pantoprazole (PROTONIX) 40 MG tablet Take 1 tablet (40 mg total) by mouth daily.  . ticagrelor (BRILINTA) 90 MG TABS tablet Take 1 tablet (90 mg total) by mouth 2 (two) times daily.     No Known Allergies  Social History   Socioeconomic History  . Marital status: Married    Spouse name: Not on file  . Number of children: Not on file  . Years of education: Not on file  . Highest education level: Not on file  Occupational History  . Not on file  Tobacco Use  . Smoking status: Current Every Day Smoker    Packs/day: 0.50    Years: 50.00    Pack years: 25.00    Types: Cigarettes  . Smokeless tobacco: Never Used  . Tobacco comment: 02/09/2016 "chewed from age [age]"  Substance and Sexual Activity  . Alcohol use: Yes    Comment: every 2-3 weeks  . Drug use: Never  . Sexual activity: Yes  Other Topics Concern  . Not on file  Social History Narrative   ** Merged History Encounter **  Social Determinants of Health   Financial Resource Strain: Not on file  Food Insecurity: Not on file  Transportation Needs: Not on file  Physical Activity: Not on file  Stress: Not on file  Social Connections: Not on file  Intimate Partner Violence: Not on file     Review of Systems: General: negative for chills, fever, night sweats or weight changes.  Cardiovascular: negative for chest pain, dyspnea on exertion, edema, orthopnea, palpitations, paroxysmal nocturnal dyspnea or shortness of breath Dermatological: negative for rash Respiratory: negative for cough or wheezing Urologic: negative for hematuria Abdominal: negative for nausea, vomiting, diarrhea, bright red blood per rectum, melena, or  hematemesis Neurologic: negative for visual changes, syncope, or dizziness All other systems reviewed and are otherwise negative except as noted above.    Blood pressure 110/70, pulse 72, height 5\' 4"  (1.626 m), weight 185 lb (83.9 kg).  General appearance: alert and no distress Neck: no adenopathy, no carotid bruit, no JVD, supple, symmetrical, trachea midline and thyroid not enlarged, symmetric, no tenderness/mass/nodules Lungs: clear to auscultation bilaterally Heart: regular rate and rhythm, S1, S2 normal, no murmur, click, rub or gallop Extremities: extremities normal, atraumatic, no cyanosis or edema Pulses: 2+ and symmetric Skin: Skin color, texture, turgor normal. No rashes or lesions Neurologic: Alert and oriented X 3, normal strength and tone. Normal symmetric reflexes. Normal coordination and gait  EKG not performed today  ASSESSMENT AND PLAN:   Tobacco abuse 50 pack years of tobacco abuse smoking 1 pack a day down to 1/2 pack/day recalcitrant to receptor modification.  I did counsel him about the importance of smoking cessation today.  Essential hypertension History of essential potential blood pressure measured today 122/98.  Blood pressure at the end of his visit was 110/70.  He is on low-dose lisinopril and metoprolol.  Hyperlipidemia History of hyperlipidemia on high-dose statin therapy.  We will recheck a lipid liver profile today.  Acute ST elevation myocardial infarction (STEMI) involving right coronary artery (HCC) History of CAD status post inferior STEMI 05/06/2020.  He was awakened from sleep at midnight with chest pain and called EMS.  His EKG showed inferior C7 elevation.  Brought to [facility] urgently and I performed radial diagnostic cath after I attempted femoral cannulation revealing an occluded right iliac artery.  He had a subtotal mid dominant RCA which I stented using a 3 mm x 18 mm long resolute Onyx drug-eluting stent postdilated with a 3.25  mm balloon.  He remains on aspirin and Brilinta.  He is asymptomatic.  Ischemic cardiomyopathy EF at the time of heart cath was 45 to 50%.  He has no symptoms of heart failure.  We will recheck a 2D echocardiogram.  PAD (peripheral artery disease) (HCC) Angiography at time of STEMI revealed an occluded right iliac artery.  He has minimal claudication.  I am going to obtain lower extremity arterial Doppler studies.      [facility] MD FACP,FACC,FAHA, [facility] 10/03/2020 9:22 AM

## 2020-10-03 NOTE — Assessment & Plan Note (Signed)
History of hyperlipidemia on high-dose statin therapy.  We will recheck a lipid liver profile today. 

## 2020-10-03 NOTE — Assessment & Plan Note (Addendum)
History of essential potential blood pressure measured today 122/98.  Blood pressure at the end of his visit was 110/70.  He is on low-dose lisinopril and metoprolol.

## 2020-10-03 NOTE — Assessment & Plan Note (Signed)
Angiography at time of STEMI revealed an occluded right iliac artery.  He has minimal claudication.  I am going to obtain lower extremity arterial Doppler studies.

## 2020-10-03 NOTE — Assessment & Plan Note (Signed)
EF at the time of heart cath was 45 to 50%.  He has no symptoms of heart failure.  We will recheck a 2D echocardiogram.

## 2020-10-03 NOTE — Assessment & Plan Note (Signed)
50 pack years of tobacco abuse smoking 1 pack a day down to 1/2 pack/day recalcitrant to receptor modification.  I did counsel him about the importance of smoking cessation today.

## 2020-10-04 LAB — HEPATIC FUNCTION PANEL
ALT: 21 IU/L (ref 0–44)
AST: 24 IU/L (ref 0–40)
Albumin: 3.6 g/dL — ABNORMAL LOW (ref 3.8–4.8)
Alkaline Phosphatase: 83 IU/L (ref 44–121)
Bilirubin Total: 0.6 mg/dL (ref 0.0–1.2)
Bilirubin, Direct: 0.18 mg/dL (ref 0.00–0.40)
Total Protein: 6.4 g/dL (ref 6.0–8.5)

## 2020-10-04 LAB — LIPID PANEL
Chol/HDL Ratio: 3.2 ratio (ref 0.0–5.0)
Cholesterol, Total: 116 mg/dL (ref 100–199)
HDL: 36 mg/dL — ABNORMAL LOW (ref >39)
LDL Chol Calc (NIH): 59 mg/dL (ref 0–99)
Triglycerides: 112 mg/dL (ref 0–149)
VLDL Cholesterol Cal: 21 mg/dL (ref 5–40)

## 2020-10-15 ENCOUNTER — Telehealth: Payer: Self-pay

## 2020-10-15 ENCOUNTER — Ambulatory Visit (HOSPITAL_COMMUNITY)
Admission: RE | Admit: 2020-10-15 | Discharge: 2020-10-15 | Disposition: A | Discharge status: Home / Self Care | Payer: 59 | Source: Ambulatory Visit | Attending: Cardiovascular Disease | Admitting: Cardiovascular Disease

## 2020-10-15 ENCOUNTER — Other Ambulatory Visit: Payer: Self-pay

## 2020-10-15 DIAGNOSIS — I739 Peripheral vascular disease, unspecified: Secondary | ICD-10-CM

## 2020-10-15 NOTE — Telephone Encounter (Signed)
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  After chart review and results of LEA given Dr. Allyson Sabal is comfortable allowing the pt to return to work with no restrictions.  Letter created by this RN and signed by Dr. Allyson Sabal. Note given to Danielle to give to pt.

## 2020-10-24 ENCOUNTER — Ambulatory Visit (HOSPITAL_COMMUNITY): Payer: 59 | Attending: Internal Medicine

## 2020-10-24 ENCOUNTER — Other Ambulatory Visit: Payer: Self-pay

## 2020-10-24 DIAGNOSIS — I2510 Atherosclerotic heart disease of native coronary artery without angina pectoris: Secondary | ICD-10-CM | POA: Diagnosis not present

## 2020-10-24 DIAGNOSIS — I255 Ischemic cardiomyopathy: Secondary | ICD-10-CM | POA: Diagnosis not present

## 2020-10-24 LAB — ECHOCARDIOGRAM COMPLETE
Area-P 1/2: 4.31 cm2
S' Lateral: 3 cm

## 2020-10-24 MED ORDER — PERFLUTREN LIPID MICROSPHERE
1.0000 mL | INTRAVENOUS | Status: AC | PRN
  Administered 2020-10-24: 3 mL via INTRAVENOUS

## 2020-11-04 ENCOUNTER — Telehealth: Payer: Self-pay

## 2020-11-04 NOTE — Telephone Encounter (Signed)
Spoke with Jonathan Miller regarding FMLA paperwork. Pt asked if he could pick up tomorrow 11/05/20. Confirmed that paperwork would be completed and located at the front desk when he arrives.

## 2020-11-07 ENCOUNTER — Telehealth: Payer: Self-pay | Admitting: Cardiovascular Disease

## 2020-11-07 NOTE — Telephone Encounter (Signed)
   Primary Cardiologist: Nanetta Batty, MD  Chart reviewed as part of pre-operative protocol coverage. Mr.  Jonathan Miller. was last seen by Dr. Allyson Sabal 10/03/20. He was doing well from cardiac perspective. Hx of CAD s/p inferior STEMI 05/06/20 treated with DES to RCA. He has remained on Aspirin and Brilinta and was recommended for DAPT for at least 12 months. Echo at time of event 45-50%. Repeat echo 10/24/20 with LVEF 50-55%.   Will route to his primary cardiologist for recommendation as patient is only 6 months from DES to RCA.  Alver Sorrow, NP 11/07/2020, 4:01 PM

## 2020-11-07 NOTE — Telephone Encounter (Signed)
Okay to hold antiplatelet drugs for colonoscopy. 

## 2020-11-07 NOTE — Telephone Encounter (Signed)
° °  [facility] Pre-operative Risk Assessment    HEARTCARE STAFF: - Please ensure there is not already an duplicate clearance open for this procedure. - Under Visit Info/Reason for Call, type in Other and utilize the format Clearance MM/DD/YY or Clearance TBD. Do not use dashes or single digits. - If request is for dental extraction, please clarify the # of teeth to be extracted.  Request for surgical clearance:  1. What type of surgery is being performed? Colonoscopy  2. When is this surgery scheduled? 02.24.22  3. What type of clearance is required (medical clearance vs. Pharmacy clearance to hold med vs. Both)? Both  4. Are there any medications that need to be held prior to surgery and how long?Brilinta for  2 days prior   5. Practice name and name of physician performing surgery? Dr. Fuller Canada, [facility]  6. What is the office phone number? [phone]   7.   What is the office fax number? [phone]  8.   Anesthesia type (None, local, MAC, general) ? Propofol   Facility needs an answer by Monday afternoon because the patient would have to stop medication on Tuesday.    Johnna Acosta 11/07/2020, 3:41 PM  _________________________________________________________________   (provider comments below)

## 2020-11-10 NOTE — Telephone Encounter (Signed)
   Primary Cardiologist: Nanetta Batty, MD  Chart reviewed as part of pre-operative protocol coverage. Patient was contacted 11/10/2020 in reference to pre-operative risk assessment for pending surgery as outlined below.  Jonathan Mayhew Callan Norden. was last seen on 10/03/20 by Dr. Allyson Sabal.  Since that day, Jonathan Grundman. has done well.  Therefore, based on ACC/AHA guidelines, the patient would be at acceptable risk for the planned procedure without further cardiovascular testing.   Per his primary cardiologist he may hold Brilinta 2 days prior to the planned procedure. He should continue Aspirin throughout the periprocedureal period. Resume Brilinta as safe as possible from GI perspective.   The patient was advised that if he develops new symptoms prior to surgery to contact our office to arrange for a follow-up visit, and he verbalized understanding.  He does note some cold-like symptoms and that he may reschedule his colonoscopy. Encouraged to call Dr. Azucena Freed office as well as his primary care provider.   I will route this recommendation to the requesting party via Epic fax function and remove from pre-op pool. Please call with questions.  Alver Sorrow, NP 11/10/2020, 10:43 AM

## 2020-12-01 ENCOUNTER — Other Ambulatory Visit: Payer: Self-pay | Admitting: Physician Assistant

## 2020-12-29 ENCOUNTER — Other Ambulatory Visit: Payer: Self-pay | Admitting: Student

## 2020-12-29 NOTE — Telephone Encounter (Signed)
This is Dr. Berry's pt 

## 2021-05-22 ENCOUNTER — Telehealth: Payer: Self-pay | Admitting: Cardiovascular Disease

## 2021-05-22 NOTE — Telephone Encounter (Signed)
Left a message for the patient to call back.  

## 2021-05-22 NOTE — Telephone Encounter (Signed)
Pt c/o swelling: STAT is pt has developed SOB within 24 hours  If swelling, where is the swelling located? Ankles   How much weight have you gained and in what time span? Has not gained weight   Have you gained 3 pounds in a day or 5 pounds in a week? No   Do you have a log of your daily weights (if so, list)? No   Are you currently taking a fluid pill? No   Are you currently SOB? No   Have you traveled recently? No   Believes swelling is due to salt intake. States they are not that big, wife just wanted him to inform the office.

## 2021-05-27 ENCOUNTER — Other Ambulatory Visit: Payer: Self-pay | Admitting: Student

## 2021-05-27 NOTE — Telephone Encounter (Signed)
Called patient, LVM to call back if still needed.

## 2021-05-28 NOTE — Telephone Encounter (Signed)
Left a message for the patient to call back if anything was needed.   Encounter will be closed

## 2021-06-26 ENCOUNTER — Other Ambulatory Visit: Payer: Self-pay | Admitting: Cardiovascular Disease

## 2021-07-27 ENCOUNTER — Other Ambulatory Visit: Payer: Self-pay | Admitting: Cardiovascular Disease

## 2021-08-25 ENCOUNTER — Other Ambulatory Visit: Payer: Self-pay | Admitting: Cardiovascular Disease

## 2021-09-25 ENCOUNTER — Other Ambulatory Visit: Payer: Self-pay | Admitting: Cardiovascular Disease

## 2021-09-30 ENCOUNTER — Other Ambulatory Visit: Payer: Self-pay

## 2021-09-30 ENCOUNTER — Ambulatory Visit (INDEPENDENT_AMBULATORY_CARE_PROVIDER_SITE_OTHER): Payer: Medicare HMO | Admitting: Cardiovascular Disease

## 2021-09-30 ENCOUNTER — Encounter: Payer: Self-pay | Admitting: Cardiovascular Disease

## 2021-09-30 DIAGNOSIS — I252 Old myocardial infarction: Secondary | ICD-10-CM

## 2021-09-30 DIAGNOSIS — I739 Peripheral vascular disease, unspecified: Secondary | ICD-10-CM

## 2021-09-30 DIAGNOSIS — Z720 Tobacco use: Secondary | ICD-10-CM

## 2021-09-30 DIAGNOSIS — I2111 ST elevation (STEMI) myocardial infarction involving right coronary artery: Secondary | ICD-10-CM

## 2021-09-30 DIAGNOSIS — I10 Essential (primary) hypertension: Secondary | ICD-10-CM

## 2021-09-30 DIAGNOSIS — E782 Mixed hyperlipidemia: Secondary | ICD-10-CM | POA: Diagnosis not present

## 2021-09-30 DIAGNOSIS — I255 Ischemic cardiomyopathy: Secondary | ICD-10-CM

## 2021-09-30 LAB — LIPID PANEL
Chol/HDL Ratio: 2 ratio (ref 0.0–5.0)
Cholesterol, Total: 90 mg/dL — ABNORMAL LOW (ref 100–199)
HDL: 45 mg/dL (ref >39)
LDL Chol Calc (NIH): 33 mg/dL (ref 0–99)
Triglycerides: 42 mg/dL (ref 0–149)
VLDL Cholesterol Cal: 12 mg/dL (ref 5–40)

## 2021-09-30 LAB — HEPATIC FUNCTION PANEL
ALT: 16 IU/L (ref 0–44)
AST: 20 IU/L (ref 0–40)
Albumin: 4.1 g/dL (ref 3.8–4.8)
Alkaline Phosphatase: 58 IU/L (ref 44–121)
Bilirubin Total: 0.5 mg/dL (ref 0.0–1.2)
Bilirubin, Direct: 0.18 mg/dL (ref 0.00–0.40)
Total Protein: 6.3 g/dL (ref 6.0–8.5)

## 2021-09-30 MED ORDER — CLOPIDOGREL BISULFATE 75 MG PO TABS: 75.0000 mg | ORAL_TABLET | Freq: Every day | ORAL | 3 refills | Status: DC

## 2021-09-30 NOTE — Assessment & Plan Note (Signed)
History of essential hypertension blood pressure measured at 122/80.  He is on lisinopril and metoprolol.

## 2021-09-30 NOTE — Assessment & Plan Note (Signed)
History of inferior STEMI evening of 05/06/2020.  He was brought urgently to the Cath Lab where I accessed the right common femoral artery demonstrated an occluded right iliac.  I then switched to his right radial demonstrating a subtotally occluded dominant RCA which I stented with a 3 mm x 18 mm long Medtronic resolute Onyx drug-eluting stent postdilated to 2 3.25 mm.  The left system was free of significant disease.  He remains on aspirin and Brilinta.  I am going to discontinue Brilinta and transition him to Plavix.

## 2021-09-30 NOTE — Assessment & Plan Note (Signed)
History of hyperlipidemia on high-dose atorvastatin with lipid profile performed 10/03/2020 revealing total cholesterol 116, LDL 59 and HDL 36.

## 2021-09-30 NOTE — Assessment & Plan Note (Signed)
History of ischemic cardiomyopathy with recent echo performed 10/24/2020 revealing an EF of 50 to 55% without valvular abnormalities.  This represents an improvement in his EF compared to the echo performed 05/06/2020.

## 2021-09-30 NOTE — Assessment & Plan Note (Signed)
History of ongoing tobacco abuse of approxi-1/2 pack a day recalcitrant to reflect location.

## 2021-09-30 NOTE — Assessment & Plan Note (Signed)
History of peripheral arterial disease with Dopplers performed 10/15/2020 revealing ABIs in the 0.5 range bilaterally with what appears to be occluded iliac arteries bilaterally.  The patient denies claudication.

## 2021-09-30 NOTE — Progress Notes (Signed)
09/30/2021 Jonathan Miller.   December 31, 1955  [id number]  Primary Physician Charlott Rakes, MD Primary Cardiologist: Lorretta Harp MD [name], [place]  HPI:  Jonathan Miller. is a 66 y.o. 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  I last saw him in the office 10/03/2020.  His risk factors include 50 pack years of tobacco use continues to smoke 1/2 pack/day as well as treated hypertension hyperlipidemia.  He was awakened at midnight on 05/06/2020 with chest pain.  His EKG showed inferior ST segment elevation.  He is brought urgently to the Cath Lab where I initially accessed his right common femoral demonstrating an occluded right iliac artery.  I then switched to right radial demonstrating a subtotally occluded dominant RCA which I stented with a 3 mm x 18 mm long Medtronic resolute Onyx drug-eluting stent postdilated with a 3.25 mm balloon.  He is remained stable since on dual antiplatelet therapy including aspirin and Brilinta.    Since I saw him a year ago he is remained stable.  He denies chest pain, shortness of breath or claudication.  He does continue to smoke unfortunately however.   Current Meds  Medication Sig   aspirin EC 81 MG tablet Take 1 tablet (81 mg total) by mouth daily. Swallow whole.   atorvastatin (LIPITOR) 80 MG tablet TAKE 1 TABLET(80 MG) BY MOUTH DAILY   BRILINTA 90 MG TABS tablet TAKE 1 TABLET(90 MG) BY MOUTH TWICE DAILY   insulin NPH-regular Human (NOVOLIN 70/30) (70-30) 100 UNIT/ML injection Inject 10 Units into the skin 2 (two) times daily with a meal.   Insulin Pen Needle (PEN NEEDLES) 32G X 4 MM MISC 1 each by Does not apply route 2 (two) times daily.   lisinopril (ZESTRIL) 5 MG tablet TAKE 1 TABLET(5 MG) BY MOUTH DAILY   metoprolol tartrate (LOPRESSOR) 25 MG tablet TAKE 1 TABLET BY MOUTH TWICE DAILY   nicotine (NICODERM  CQ - DOSED IN MG/24 HOURS) 14 mg/24hr patch Place a patch (14 mg) on your skin daily for 6 weeks   nicotine (NICODERM CQ - DOSED IN MG/24 HR) 7 mg/24hr patch Place a patch on your skin daily for 14 days.   nitroGLYCERIN (NITROSTAT) 0.4 MG SL tablet Place 1 tablet (0.4 mg total) under the tongue every 5 (five) minutes as needed for chest pain (up to 3 doses max).   pantoprazole (PROTONIX) 40 MG tablet TAKE 1 TABLET(40 MG) BY MOUTH DAILY     No Known Allergies  Social History   Socioeconomic History   Marital status: Married    Spouse name: Not on file   Number of children: Not on file   Years of education: Not on file   Highest education level: Not on file  Occupational History   Not on file  Tobacco Use   Smoking status: Every Day    Packs/day: 0.50    Years: 50.00    Pack years: 25.00    Types: Cigarettes   Smokeless tobacco: Never   Tobacco comments:    02/09/2016 "chewed from age [age]"  Substance and Sexual Activity   Alcohol use: Yes    Comment: every 2-3 weeks   Drug use: Never   Sexual activity: Yes  Other Topics Concern   Not on file  Social History Narrative   ** Merged History  Encounter **       Social Determinants of Health   Financial Resource Strain: Not on file  Food Insecurity: Not on file  Transportation Needs: Not on file  Physical Activity: Not on file  Stress: Not on file  Social Connections: Not on file  Intimate Partner Violence: Not on file     Review of Systems: General: negative for chills, fever, night sweats or weight changes.  Cardiovascular: negative for chest pain, dyspnea on exertion, edema, orthopnea, palpitations, paroxysmal nocturnal dyspnea or shortness of breath Dermatological: negative for rash Respiratory: negative for cough or wheezing Urologic: negative for hematuria Abdominal: negative for nausea, vomiting, diarrhea, bright red blood per rectum, melena, or hematemesis Neurologic: negative for visual changes, syncope, or  dizziness All other systems reviewed and are otherwise negative except as noted above.    Blood pressure 122/80, pulse 63, resp. rate 20, height 5\' 5"  (1.651 m), weight 201 lb (91.2 kg), SpO2 95 %.  General appearance: alert and no distress Neck: no adenopathy, no carotid bruit, no JVD, supple, symmetrical, trachea midline, and thyroid not enlarged, symmetric, no tenderness/mass/nodules Lungs: clear to auscultation bilaterally Heart: regular rate and rhythm, S1, S2 normal, no murmur, click, rub or gallop Extremities: extremities normal, atraumatic, no cyanosis or edema Pulses: Diminished pedal pulses Skin: Skin color, texture, turgor normal. No rashes or lesions Neurologic: Grossly normal  EKG sinus rhythm at 63 with small inferior Q waves and early R wave transition.  I personally reviewed this EKG.  ASSESSMENT AND PLAN:   Tobacco abuse History of ongoing tobacco abuse of approxi-1/2 pack a day recalcitrant to reflect location.  Essential hypertension History of essential hypertension blood pressure measured at 122/80.  He is on lisinopril and metoprolol.  Hyperlipidemia History of hyperlipidemia on high-dose atorvastatin with lipid profile performed 10/03/2020 revealing total cholesterol 116, LDL 59 and HDL 36.  Acute ST elevation myocardial infarction (STEMI) involving right coronary artery ([place]) History of inferior STEMI evening of 05/06/2020.  He was brought urgently to the Cath Lab where I accessed the right common femoral artery demonstrated an occluded right iliac.  I then switched to his right radial demonstrating a subtotally occluded dominant RCA which I stented with a 3 mm x 18 mm long Medtronic resolute Onyx drug-eluting stent postdilated to 2 3.25 mm.  The left system was free of significant disease.  He remains on aspirin and Brilinta.  I am going to discontinue Brilinta and transition him to Plavix.  Ischemic cardiomyopathy History of ischemic cardiomyopathy with recent  echo performed 10/24/2020 revealing an EF of 50 to 55% without valvular abnormalities.  This represents an improvement in his EF compared to the echo performed 05/06/2020.  PAD (peripheral artery disease) (HCC) History of peripheral arterial disease with Dopplers performed 10/15/2020 revealing ABIs in the 0.5 range bilaterally with what appears to be occluded iliac arteries bilaterally.  The patient denies claudication.     Lorretta Harp MD FACP,FACC,FAHA, [facility] 09/30/2021 10:48 AM

## 2021-09-30 NOTE — Patient Instructions (Signed)
Medication Instructions:   -Start taking clopidogrel (plavix) 75mg  once daily.  -Stop taking Brilinta.  *If you need a refill on your cardiac medications before your next appointment, please call your pharmacy*   Lab Work: Your physician recommends that you have labs drawn today: lipid/liver profile.  If you have labs (blood work) drawn today and your tests are completely normal, you will receive your results only by: MyChart Message (if you have MyChart) OR A paper copy in the mail If you have any lab test that is abnormal or we need to change your treatment, we will call you to review the results.   Follow-Up: At [facility], you and your health needs are our priority.  As part of our continuing mission to provide you with exceptional heart care, we have created designated Provider Care Teams.  These Care Teams include your primary Cardiologist (physician) and Advanced Practice Providers (APPs -  Physician Assistants and Nurse Practitioners) who all work together to provide you with the care you need, when you need it.  We recommend signing up for the patient portal called "MyChart".  Sign up information is provided on this After Visit Summary.  MyChart is used to connect with patients for Virtual Visits (Telemedicine).  Patients are able to view lab/test results, encounter notes, upcoming appointments, etc.  Non-urgent messages can be sent to your provider as well.   To learn more about what you can do with MyChart, go to [facility].    Your next appointment:   12 month(s)  The format for your next appointment:   In Person  Provider:   [email or web address], MD

## 2021-10-06 ENCOUNTER — Other Ambulatory Visit: Payer: Self-pay

## 2021-11-03 NOTE — Addendum Note (Signed)
Addended by: Lamar Benes on: 11/03/2021 03:25 PM   Modules accepted: Orders

## 2021-11-27 ENCOUNTER — Other Ambulatory Visit: Payer: Self-pay | Admitting: Cardiovascular Disease

## 2022-01-05 ENCOUNTER — Other Ambulatory Visit: Payer: Self-pay | Admitting: Cardiovascular Disease

## 2022-04-05 ENCOUNTER — Other Ambulatory Visit: Payer: Self-pay | Admitting: Cardiovascular Disease

## 2022-07-04 ENCOUNTER — Other Ambulatory Visit: Payer: Self-pay | Admitting: Cardiovascular Disease

## 2022-10-18 ENCOUNTER — Other Ambulatory Visit: Payer: Self-pay

## 2022-10-18 MED ORDER — METOPROLOL TARTRATE 25 MG PO TABS: 25.0000 mg | ORAL_TABLET | Freq: Two times a day (BID) | ORAL | 1 refills | Status: DC

## 2022-10-27 ENCOUNTER — Encounter: Payer: Self-pay | Admitting: Cardiovascular Disease

## 2022-10-27 ENCOUNTER — Ambulatory Visit: Payer: Medicare PPO | Attending: Cardiovascular Disease | Admitting: Cardiovascular Disease

## 2022-10-27 VITALS — BP 120/78 | HR 79 | Ht 66.0 in | Wt 202.4 lb

## 2022-10-27 DIAGNOSIS — I10 Essential (primary) hypertension: Secondary | ICD-10-CM

## 2022-10-27 DIAGNOSIS — R739 Hyperglycemia, unspecified: Secondary | ICD-10-CM

## 2022-10-27 DIAGNOSIS — E782 Mixed hyperlipidemia: Secondary | ICD-10-CM

## 2022-10-27 DIAGNOSIS — I2111 ST elevation (STEMI) myocardial infarction involving right coronary artery: Secondary | ICD-10-CM | POA: Diagnosis not present

## 2022-10-27 DIAGNOSIS — I739 Peripheral vascular disease, unspecified: Secondary | ICD-10-CM

## 2022-10-27 DIAGNOSIS — I255 Ischemic cardiomyopathy: Secondary | ICD-10-CM | POA: Diagnosis not present

## 2022-10-27 MED ORDER — CLOPIDOGREL BISULFATE 75 MG PO TABS: 75.0000 mg | ORAL_TABLET | Freq: Every day | ORAL | 3 refills | Status: DC

## 2022-10-27 MED ORDER — METOPROLOL TARTRATE 25 MG PO TABS: 25.0000 mg | ORAL_TABLET | Freq: Two times a day (BID) | ORAL | 3 refills | Status: DC

## 2022-10-27 NOTE — Assessment & Plan Note (Signed)
Patient says he stopped smoking 2 days ago.  He does have a 50-pack-year history tobacco abuse having smoked 1/2 pack a day up until now.

## 2022-10-27 NOTE — Patient Instructions (Signed)

## 2022-10-27 NOTE — Assessment & Plan Note (Signed)
History of peripheral arterial disease with demonstration of an occluded right iliac artery at the time of STEMI intervention.  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  He however denies claudication.

## 2022-10-27 NOTE — Assessment & Plan Note (Signed)
History of inferior STEMI 05/06/2020 when he was awakened at midnight with chest pain.  His EKG showed inferior ST segment elevation.  I brought him urgently to the Cath Lab where I initially accessed his right common femoral artery demonstrated an occluded right iliac artery.  I switched his right radial artery demonstrating a subtotally occluded dominant RCA which I stented using a 3 mm x 18 mm long Medtronic resolute Onyx drug-eluting stent postdilated to 3.25 mm.  He was on aspirin and Brilinta initially was switched which was changed to aspirin and clopidogrel.  He denies chest pain or shortness of breath.

## 2022-10-27 NOTE — Progress Notes (Signed)
10/27/2022 Jonathan Miller.   1956-07-21  [id number]  Primary Physician Jonathan Rakes, MD Primary Cardiologist: Lorretta Harp MD [name], [place]  HPI:  Jonathan Miller. is a 67 y.o.   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  I last saw him in the office 09/30/2021.Marland Kitchen  His risk factors include 50 pack years of tobacco use continues to smoke 1/2 pack/day as well as treated hypertension hyperlipidemia.  He was awakened at midnight on 05/06/2020 with chest pain.  His EKG showed inferior ST segment elevation.  He is brought urgently to the Cath Lab where I initially accessed his right common femoral demonstrating an occluded right iliac artery.  I then switched to right radial demonstrating a subtotally occluded dominant RCA which I stented with a 3 mm x 18 mm long Medtronic resolute Onyx drug-eluting stent postdilated with a 3.25 mm balloon.  He is remained stable since on dual antiplatelet therapy including aspirin and Brilinta which transition to clopidogrel.   Since I saw him a year ago he is remained stable.  He denies chest pain, shortness of breath or claudication.  He has told me that he stopped smoking 2 days ago.  Current Meds  Medication Sig   aspirin EC 81 MG tablet Take 1 tablet (81 mg total) by mouth daily. Swallow whole.   atorvastatin (LIPITOR) 80 MG tablet TAKE 1 TABLET(80 MG) BY MOUTH DAILY   insulin NPH-regular Human (NOVOLIN 70/30) (70-30) 100 UNIT/ML injection Inject 10 Units into the skin 2 (two) times daily with a meal.   Insulin Pen Needle (PEN NEEDLES) 32G X 4 MM MISC 1 each by Does not apply route 2 (two) times daily.   lisinopril (ZESTRIL) 5 MG tablet TAKE 1 TABLET(5 MG) BY MOUTH DAILY   nicotine (NICODERM CQ - DOSED IN MG/24 HOURS) 14 mg/24hr patch Place a patch (14 mg) on your skin daily for 6 weeks   nitroGLYCERIN  (NITROSTAT) 0.4 MG SL tablet Place 1 tablet (0.4 mg total) under the tongue every 5 (five) minutes as needed for chest pain (up to 3 doses max).   pantoprazole (PROTONIX) 40 MG tablet TAKE 1 TABLET(40 MG) BY MOUTH DAILY   [DISCONTINUED] clopidogrel (PLAVIX) 75 MG tablet Take 1 tablet (75 mg total) by mouth daily.   [DISCONTINUED] metoprolol tartrate (LOPRESSOR) 25 MG tablet Take 1 tablet (25 mg total) by mouth 2 (two) times daily.   [DISCONTINUED] nicotine (NICODERM CQ - DOSED IN MG/24 HR) 7 mg/24hr patch Place a patch on your skin daily for 14 days.     No Known Allergies  Social History   Socioeconomic History   Marital status: Married    Spouse name: Not on file   Number of children: Not on file   Years of education: Not on file   Highest education level: Not on file  Occupational History   Not on file  Tobacco Use   Smoking status: Every Day    Packs/day: 0.50    Years: 50.00    Total pack years: 25.00    Types: Cigarettes   Smokeless tobacco: Never   Tobacco comments:    02/09/2016 "chewed from age [age]"  Substance and Sexual Activity   Alcohol use: Yes    Comment: every 2-3 weeks   Drug use: Never   Sexual activity: Yes  Other Topics Concern   Not on file  Social History Narrative   ** Merged History Encounter **       Social Determinants of Health   Financial Resource Strain: Not on file  Food Insecurity: Not on file  Transportation Needs: Not on file  Physical Activity: Not on file  Stress: Not on file  Social Connections: Not on file  Intimate Partner Violence: Not on file     Review of Systems: General: negative for chills, fever, night sweats or weight changes.  Cardiovascular: negative for chest pain, dyspnea on exertion, edema, orthopnea, palpitations, paroxysmal nocturnal dyspnea or shortness of breath Dermatological: negative for rash Respiratory: negative for cough or wheezing Urologic: negative for hematuria Abdominal: negative for nausea,  vomiting, diarrhea, bright red blood per rectum, melena, or hematemesis Neurologic: negative for visual changes, syncope, or dizziness All other systems reviewed and are otherwise negative except as noted above.    Blood pressure 120/78, pulse 79, height 5\' 6"  (1.676 m), weight 202 lb 6.4 oz (91.8 kg), SpO2 90 %.  General appearance: alert and no distress Neck: no adenopathy, no carotid bruit, no JVD, supple, symmetrical, trachea midline, and thyroid not enlarged, symmetric, no tenderness/mass/nodules Lungs: clear to auscultation bilaterally Heart: regular rate and rhythm, S1, S2 normal, no murmur, click, rub or gallop Extremities: extremities normal, atraumatic, no cyanosis or edema Pulses: Absent pedal pulses [name] Skin: Skin color, texture, turgor normal. No rashes or lesions Neurologic: Grossly normal  EKG sinus rhythm at 79 with inferior Q waves.  Personally reviewed this EKG.  ASSESSMENT AND PLAN:   Tobacco abuse Patient says he stopped smoking 2 days ago.  He does have a 50-pack-year history tobacco abuse having smoked 1/2 pack a day up until now.  Hyperglycemia History of essential hypertension on metoprolol and lisinopril.  Blood pressure today is 120/78.  Hyperlipidemia History of hyperlipidemia on high-dose statin therapy with lipid profile performed 09/30/2021 revealing total cholesterol of 90, LDL 33 and HDL 45.  Acute ST elevation myocardial infarction (STEMI) involving right coronary artery ([place]) History of inferior STEMI 05/06/2020 when he was awakened at midnight with chest pain.  His EKG showed inferior ST segment elevation.  I brought him urgently to the Cath Lab where I initially accessed his right common femoral artery demonstrated an occluded right iliac artery.  I switched his right radial artery demonstrating a subtotally occluded dominant RCA which I stented using a 3 mm x 18 mm long Medtronic resolute Onyx drug-eluting stent postdilated to 3.25 mm.  He was on  aspirin and Brilinta initially was switched which was changed to aspirin and clopidogrel.  He denies chest pain or shortness of breath.  PAD (peripheral artery disease) (HCC) History of peripheral arterial disease with demonstration of an occluded right iliac artery at the time of STEMI intervention.  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  He however denies claudication.     Lorretta Harp MD FACP,FACC,FAHA, [facility] 10/27/2022 9:19 AM

## 2022-10-27 NOTE — Assessment & Plan Note (Signed)
History of hyperlipidemia on high-dose statin therapy with lipid profile performed 09/30/2021 revealing total cholesterol of 90, LDL 33 and HDL 45.

## 2022-10-27 NOTE — Assessment & Plan Note (Signed)
History of essential hypertension on metoprolol and lisinopril.  Blood pressure today is 120/78.

## 2023-07-18 ENCOUNTER — Other Ambulatory Visit: Payer: Self-pay | Admitting: Cardiovascular Disease

## 2023-08-10 DIAGNOSIS — N1831 Chronic kidney disease, stage 3a: Secondary | ICD-10-CM | POA: Insufficient documentation

## 2023-10-31 ENCOUNTER — Ambulatory Visit: Payer: Medicare PPO | Attending: Cardiovascular Disease | Admitting: Cardiovascular Disease

## 2023-10-31 ENCOUNTER — Encounter: Payer: Self-pay | Admitting: Cardiovascular Disease

## 2023-10-31 VITALS — BP 140/82 | HR 76 | Ht 65.0 in | Wt 198.8 lb

## 2023-10-31 DIAGNOSIS — Z720 Tobacco use: Secondary | ICD-10-CM

## 2023-10-31 DIAGNOSIS — I739 Peripheral vascular disease, unspecified: Secondary | ICD-10-CM | POA: Diagnosis not present

## 2023-10-31 DIAGNOSIS — I255 Ischemic cardiomyopathy: Secondary | ICD-10-CM

## 2023-10-31 DIAGNOSIS — E782 Mixed hyperlipidemia: Secondary | ICD-10-CM | POA: Diagnosis not present

## 2023-10-31 DIAGNOSIS — I10 Essential (primary) hypertension: Secondary | ICD-10-CM | POA: Diagnosis not present

## 2023-10-31 DIAGNOSIS — E785 Hyperlipidemia, unspecified: Secondary | ICD-10-CM

## 2023-10-31 DIAGNOSIS — I2111 ST elevation (STEMI) myocardial infarction involving right coronary artery: Secondary | ICD-10-CM

## 2023-10-31 NOTE — Assessment & Plan Note (Signed)
 Jonathan Miller had stopped smoking a year ago when I had seen him but has restarted now smoking 1/2 pack/day.

## 2023-10-31 NOTE — Patient Instructions (Signed)

## 2023-10-31 NOTE — Assessment & Plan Note (Signed)
 History of CAD status post inferior STEMI 05/06/2020.  I initially tried to catheterize him femoral he revealing an occluded right iliac artery.  Then switched to right radial.  Demonstrated a subtotally occluded dominant RCA which I stented using a 3 mm x 18 mm long Medtronic resolute Onyx drug-eluting stent postdilated to 3.25 mm.  He was initially on aspirin  and Brilinta  which transitioned to clopidogrel .  He is asymptomatic.

## 2023-10-31 NOTE — Assessment & Plan Note (Signed)
 History of PAD with Dopplers performed 10/15/2020 revealing ABIs in the 0.5 range bilaterally with what appears to be occluded iliac arteries although he is completely asymptomatic.

## 2023-10-31 NOTE — Progress Notes (Signed)
 10/31/2023 Jonathan Miller.   [birth year]  [id number]  Primary Physician Joaquin Mulberry, MD Primary Cardiologist: Avanell Leigh MD Bennye Bravo, [place]  HPI:  Jonathan Miller. is a 68 y.o.     mildly overweight married Latino male father of 1 son who recently retired from being an Barrister's clerk.  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  I last saw him in the office 11/25/2022.Aaron Aas  His risk factors include 50 pack years of tobacco use continues to smoke 1/2 pack/day as well as treated hypertension hyperlipidemia.  He was awakened at midnight on 05/06/2020 with chest pain.  His EKG showed inferior ST segment elevation.  He is brought urgently to the Cath Lab where I initially accessed his right common femoral demonstrating an occluded right iliac artery.  I then switched to right radial demonstrating a subtotally occluded dominant RCA which I stented with a 3 mm x 18 mm long Medtronic resolute Onyx drug-eluting stent postdilated with a 3.25 mm balloon.  He is remained stable since on dual antiplatelet therapy including aspirin  and Brilinta  which transition to clopidogrel .   Since I saw him a year ago he is remained stable.  He denies chest pain, shortness of breath or claudication.  He has told me that he stopped smoking at the time of his last office visit a year ago but restarted smoking a half a pack a day.   Current Meds  Medication Sig   aspirin  EC 81 MG tablet Take 1 tablet (81 mg total) by mouth daily. Swallow whole.   atorvastatin  (LIPITOR ) 80 MG tablet TAKE 1 TABLET(80 MG) BY MOUTH DAILY   clopidogrel  (PLAVIX ) 75 MG tablet Take 1 tablet (75 mg total) by mouth daily.   insulin  NPH-regular Human (NOVOLIN  70/30) (70-30) 100 UNIT/ML injection Inject 10 Units into the skin 2 (two) times daily with a meal.   Insulin  Pen Needle (PEN NEEDLES) 32G X 4 MM MISC 1 each by Does not apply route 2 (two) times daily.   lisinopril  (ZESTRIL ) 5 MG tablet TAKE 1 TABLET(5  MG) BY MOUTH DAILY   metFORMIN  (GLUCOPHAGE -XR) 750 MG 24 hr tablet Take 750 mg by mouth daily.   metoprolol  tartrate (LOPRESSOR ) 25 MG tablet Take 1 tablet (25 mg total) by mouth 2 (two) times daily.   nicotine  (NICODERM CQ  - DOSED IN MG/24 HOURS) 14 mg/24hr patch Place a patch (14 mg) on your skin daily for 6 weeks   nitroGLYCERIN  (NITROSTAT ) 0.4 MG SL tablet Place 1 tablet (0.4 mg total) under the tongue every 5 (five) minutes as needed for chest pain (up to 3 doses max).   pantoprazole  (PROTONIX ) 40 MG tablet TAKE 1 TABLET(40 MG) BY MOUTH DAILY     No Known Allergies  Social History   Socioeconomic History   Marital status: Married    Spouse name: Not on file   Number of children: Not on file   Years of education: Not on file   Highest education level: Not on file  Occupational History   Not on file  Tobacco Use   Smoking status: Every Day    Current packs/day: 0.50    Average packs/day: 0.5 packs/day for 50.0 years (25.0 ttl pk-yrs)    Types: Cigarettes   Smokeless tobacco: Never   Tobacco comments:    02/09/2016 "chewed from age [age]"  Substance and Sexual Activity   Alcohol use: Yes    Comment: every 2-3  weeks   Drug use: Never   Sexual activity: Yes  Other Topics Concern   Not on file  Social History Narrative   ** Merged History Encounter **       Social Drivers of Health   Financial Resource Strain: Low Risk  (08/10/2023)   Received from [facility]   Overall Financial Resource Strain (CARDIA)    Difficulty of Paying Living Expenses: Not hard at all  Food Insecurity: No Food Insecurity (08/10/2023)   Received from [facility]   Hunger Vital Sign    Worried About Running Out of Food in the Last Year: Never true    Ran Out of Food in the Last Year: Never true  Transportation Needs: No Transportation Needs (08/10/2023)   Received [facility] - Transportation    Lack of Transportation (Medical): No    Lack of Transportation  (Non-Medical): No  Physical Activity: Sufficiently Active (08/10/2023)   Received from [facility]   Exercise Vital Sign    Days of Exercise per Week: 5 days    Minutes of Exercise per Session: 60 min  Stress: No Stress Concern Present (08/10/2023)   Received from [facility] - Occupational Stress Questionnaire    Feeling of Stress : Not at all  Social Connections: Moderately Integrated (08/10/2023)   Received from [facility]   Social Network    How would you rate your social network (family, work, friends)?: Adequate participation with social networks  Intimate Partner Violence: Not At Risk (08/10/2023)   Received from [facility]   HITS    Over the last 12 months how often did your partner physically hurt you?: Never    Over the last 12 months how often did your partner insult you or talk down to you?: Never    Over the last 12 months how often did your partner threaten you with physical harm?: Never    Over the last 12 months how often did your partner scream or curse at you?: Never     Review of Systems: General: negative for chills, fever, night sweats or weight changes.  Cardiovascular: negative for chest pain, dyspnea on exertion, edema, orthopnea, palpitations, paroxysmal nocturnal dyspnea or shortness of breath Dermatological: negative for rash Respiratory: negative for cough or wheezing Urologic: negative for hematuria Abdominal: negative for nausea, vomiting, diarrhea, bright red blood per rectum, melena, or hematemesis Neurologic: negative for visual changes, syncope, or dizziness All other systems reviewed and are otherwise negative except as noted above.    Blood pressure (!) 170/82, pulse 76, height 5\' 5"  (1.651 m), weight 198 lb 12.8 oz (90.2 kg), SpO2 93%.  General appearance: alert and no distress Neck: no adenopathy, no carotid bruit, no JVD, supple, symmetrical, trachea midline, and thyroid not enlarged,  symmetric, no tenderness/mass/nodules Lungs: clear to auscultation bilaterally Heart: regular rate and rhythm, S1, S2 normal, no murmur, click, rub or gallop Extremities: extremities normal, atraumatic, no cyanosis or edema Pulses: Absent pedal pulses Skin: Skin color, texture, turgor normal. No rashes or lesions Neurologic: Grossly normal  EKG EKG Interpretation Date/Time:  Monday October 31 2023 09:26:33 EST Ventricular Rate:  76 PR Interval:  172 QRS Duration:  92 QT Interval:  378 QTC Calculation: 425 R Axis:   32  Text Interpretation: Normal sinus rhythm Normal ECG When compared with ECG of 31-Aug-2020 22:40, Nonspecific T wave abnormality no longer evident in Inferior leads Confirmed by Lauro Portal [phone]) on 10/31/2023  9:45:49 AM    ASSESSMENT AND PLAN:   Tobacco abuse Mr. Berhane had stopped smoking a year ago when I had seen him but has restarted now smoking 1/2 pack/day.  Essential hypertension History of essential hypertension her blood pressure measured today at 170/82.  He is on lisinopril  and metoprolol  which she took this morning at 8:00.  His blood pressure when I saw him last year was 120/78.  Hyperlipidemia History of hyperlipidemia on high-dose statin therapy.  His last lipid profile was 09/30/2021 revealing total cholesterol 90, LDL 33 and HDL 45.  We will recheck a fasting lipid and liver profile.  Acute ST elevation myocardial infarction (STEMI) involving right coronary artery (HCC) History of CAD status post inferior STEMI 05/06/2020.  I initially tried to catheterize him femoral he revealing an occluded right iliac artery.  Then switched to right radial.  Demonstrated a subtotally occluded dominant RCA which I stented using a 3 mm x 18 mm long Medtronic resolute Onyx drug-eluting stent postdilated to 3.25 mm.  He was initially on aspirin  and Brilinta  which transitioned to clopidogrel .  He is asymptomatic.  PAD (peripheral artery disease) (HCC) History of PAD  with Dopplers performed 10/15/2020 revealing ABIs in the 0.5 range bilaterally with what appears to be occluded iliac arteries although he is completely asymptomatic.  Hyperlipidemia LDL goal <70 History of hyperlipidemia on high-dose statin therapy with lipid profile performed 05/10/2023 revealing total cholesterol 140, LDL 57 and HDL of 60.  Ischemic cardiomyopathy History of ischemic cardiomyopathy with an EF of 45 to 50% at the time of his inferior STEMI 05/06/2020.  His last echo performed 2//22 showed normalization of his LV function up to 50 to 55%.  He is asymptomatic.     Avanell Leigh MD FACP,FACC,FAHA, [facility] 10/31/2023 9:58 AM

## 2023-10-31 NOTE — Assessment & Plan Note (Signed)
 History of ischemic cardiomyopathy with an EF of 45 to 50% at the time of his inferior STEMI 05/06/2020.  His last echo performed 2//22 showed normalization of his LV function up to 50 to 55%.  He is asymptomatic.

## 2023-10-31 NOTE — Assessment & Plan Note (Signed)
 History of hyperlipidemia on high-dose statin therapy.  His last lipid profile was 09/30/2021 revealing total cholesterol 90, LDL 33 and HDL 45.  We will recheck a fasting lipid and liver profile.

## 2023-10-31 NOTE — Assessment & Plan Note (Signed)
 History of hyperlipidemia on high-dose statin therapy with lipid profile performed 05/10/2023 revealing total cholesterol 140, LDL 57 and HDL of 60.

## 2023-10-31 NOTE — Assessment & Plan Note (Addendum)
 History of essential hypertension her blood pressure measured today at 170/82.  Blood pressure at the end of his appointment which was 140/82.  He is on lisinopril  and metoprolol  which she took this morning at 8:00.  His blood pressure when I saw him last year was 120/78.

## 2023-11-17 ENCOUNTER — Other Ambulatory Visit: Payer: Self-pay | Admitting: Cardiovascular Disease

## 2023-11-30 ENCOUNTER — Encounter (HOSPITAL_COMMUNITY): Payer: Medicare PPO

## 2023-12-31 ENCOUNTER — Other Ambulatory Visit: Payer: Self-pay | Admitting: Cardiovascular Disease

## 2024-01-05 ENCOUNTER — Ambulatory Visit (HOSPITAL_COMMUNITY)
Admission: RE | Admit: 2024-01-05 | Discharge: 2024-01-05 | Disposition: A | Discharge status: Home / Self Care | Payer: Medicare PPO | Source: Ambulatory Visit | Attending: Cardiology | Admitting: Cardiology

## 2024-01-05 DIAGNOSIS — I739 Peripheral vascular disease, unspecified: Secondary | ICD-10-CM | POA: Insufficient documentation

## 2024-01-06 LAB — VAS US PAD ABI
Left ABI: 0.66
Right ABI: 0.61

## 2024-01-18 ENCOUNTER — Other Ambulatory Visit: Payer: Self-pay | Admitting: Cardiovascular Disease

## 2024-07-10 ENCOUNTER — Other Ambulatory Visit: Payer: Self-pay | Admitting: Cardiovascular Disease

## 2024-08-14 ENCOUNTER — Observation Stay (HOSPITAL_COMMUNITY)

## 2024-08-14 ENCOUNTER — Emergency Department (HOSPITAL_BASED_OUTPATIENT_CLINIC_OR_DEPARTMENT_OTHER): Admitting: Radiology

## 2024-08-14 ENCOUNTER — Observation Stay (HOSPITAL_BASED_OUTPATIENT_CLINIC_OR_DEPARTMENT_OTHER)
Admission: EM | Admit: 2024-08-14 | Discharge: 2024-08-15 | Disposition: A | Discharge status: Home / Self Care | Attending: Emergency Medicine | Admitting: Emergency Medicine

## 2024-08-14 ENCOUNTER — Other Ambulatory Visit: Payer: Self-pay

## 2024-08-14 ENCOUNTER — Encounter (HOSPITAL_BASED_OUTPATIENT_CLINIC_OR_DEPARTMENT_OTHER): Payer: Self-pay | Admitting: Emergency Medicine

## 2024-08-14 ENCOUNTER — Emergency Department (HOSPITAL_BASED_OUTPATIENT_CLINIC_OR_DEPARTMENT_OTHER)

## 2024-08-14 DIAGNOSIS — N179 Acute kidney failure, unspecified: Secondary | ICD-10-CM | POA: Diagnosis not present

## 2024-08-14 DIAGNOSIS — Z7982 Long term (current) use of aspirin: Secondary | ICD-10-CM | POA: Insufficient documentation

## 2024-08-14 DIAGNOSIS — R2990 Unspecified symptoms and signs involving the nervous system: Secondary | ICD-10-CM | POA: Diagnosis present

## 2024-08-14 DIAGNOSIS — J439 Emphysema, unspecified: Secondary | ICD-10-CM | POA: Diagnosis not present

## 2024-08-14 DIAGNOSIS — Z79899 Other long term (current) drug therapy: Secondary | ICD-10-CM | POA: Insufficient documentation

## 2024-08-14 DIAGNOSIS — F17220 Nicotine dependence, chewing tobacco, uncomplicated: Secondary | ICD-10-CM | POA: Insufficient documentation

## 2024-08-14 DIAGNOSIS — H4900 Third [oculomotor] nerve palsy, unspecified eye: Secondary | ICD-10-CM

## 2024-08-14 DIAGNOSIS — I2510 Atherosclerotic heart disease of native coronary artery without angina pectoris: Secondary | ICD-10-CM | POA: Insufficient documentation

## 2024-08-14 DIAGNOSIS — I6389 Other cerebral infarction: Principal | ICD-10-CM | POA: Insufficient documentation

## 2024-08-14 DIAGNOSIS — I110 Hypertensive heart disease with heart failure: Secondary | ICD-10-CM | POA: Diagnosis not present

## 2024-08-14 DIAGNOSIS — E1169 Type 2 diabetes mellitus with other specified complication: Secondary | ICD-10-CM | POA: Diagnosis present

## 2024-08-14 DIAGNOSIS — E119 Type 2 diabetes mellitus without complications: Secondary | ICD-10-CM | POA: Insufficient documentation

## 2024-08-14 DIAGNOSIS — Z720 Tobacco use: Secondary | ICD-10-CM | POA: Insufficient documentation

## 2024-08-14 DIAGNOSIS — I5033 Acute on chronic diastolic (congestive) heart failure: Secondary | ICD-10-CM | POA: Diagnosis present

## 2024-08-14 DIAGNOSIS — E785 Hyperlipidemia, unspecified: Secondary | ICD-10-CM | POA: Insufficient documentation

## 2024-08-14 DIAGNOSIS — I700 Atherosclerosis of aorta: Secondary | ICD-10-CM | POA: Diagnosis not present

## 2024-08-14 DIAGNOSIS — I152 Hypertension secondary to endocrine disorders: Secondary | ICD-10-CM | POA: Diagnosis present

## 2024-08-14 DIAGNOSIS — E1151 Type 2 diabetes mellitus with diabetic peripheral angiopathy without gangrene: Secondary | ICD-10-CM | POA: Diagnosis not present

## 2024-08-14 DIAGNOSIS — Z794 Long term (current) use of insulin: Secondary | ICD-10-CM | POA: Diagnosis not present

## 2024-08-14 DIAGNOSIS — S0411XA Injury of oculomotor nerve, right side, initial encounter: Principal | ICD-10-CM

## 2024-08-14 DIAGNOSIS — R29701 NIHSS score 1: Secondary | ICD-10-CM | POA: Diagnosis not present

## 2024-08-14 DIAGNOSIS — E875 Hyperkalemia: Secondary | ICD-10-CM | POA: Diagnosis not present

## 2024-08-14 DIAGNOSIS — H532 Diplopia: Secondary | ICD-10-CM | POA: Diagnosis present

## 2024-08-14 DIAGNOSIS — I639 Cerebral infarction, unspecified: Secondary | ICD-10-CM | POA: Diagnosis not present

## 2024-08-14 LAB — URINALYSIS, ROUTINE W REFLEX MICROSCOPIC
Bacteria, UA: NONE SEEN
Bilirubin Urine: NEGATIVE
Glucose, UA: >1000 mg/dL — AB
Ketones, ur: NEGATIVE mg/dL
Leukocytes,Ua: NEGATIVE
Nitrite: NEGATIVE
Protein, ur: >300 mg/dL — AB
Specific Gravity, Urine: 1.016 (ref 1.005–1.030)
pH: 6.5 (ref 5.0–8.0)

## 2024-08-14 LAB — I-STAT VENOUS BLOOD GAS, ED
Acid-Base Excess: 2 mmol/L (ref 0.0–2.0)
Bicarbonate: 27.5 mmol/L (ref 20.0–28.0)
Calcium, Ion: 1.16 mmol/L (ref 1.15–1.40)
HCT: 48 % (ref 39.0–52.0)
Hemoglobin: 16.3 g/dL (ref 13.0–17.0)
O2 Saturation: 83 %
Potassium: 4.9 mmol/L (ref 3.5–5.1)
Sodium: 132 mmol/L — ABNORMAL LOW (ref 135–145)
TCO2: 29 mmol/L (ref 22–32)
pCO2, Ven: 45.5 mmHg (ref 44–60)
pH, Ven: 7.39 (ref 7.25–7.43)
pO2, Ven: 49 mmHg — ABNORMAL HIGH (ref 32–45)

## 2024-08-14 LAB — GLUCOSE, CAPILLARY: Glucose-Capillary: 361 mg/dL — ABNORMAL HIGH (ref 70–99)

## 2024-08-14 LAB — BASIC METABOLIC PANEL WITH GFR
Anion gap: 8 (ref 5–15)
BUN: 42 mg/dL — ABNORMAL HIGH (ref 8–23)
CO2: 28 mmol/L (ref 22–32)
Calcium: 9.8 mg/dL (ref 8.9–10.3)
Chloride: 98 mmol/L (ref 98–111)
Creatinine, Ser: 1.56 mg/dL — ABNORMAL HIGH (ref 0.61–1.24)
GFR, Estimated: 48 mL/min — ABNORMAL LOW (ref >60)
Glucose, Bld: 316 mg/dL — ABNORMAL HIGH (ref 70–99)
Potassium: 5.3 mmol/L — ABNORMAL HIGH (ref 3.5–5.1)
Sodium: 134 mmol/L — ABNORMAL LOW (ref 135–145)

## 2024-08-14 LAB — CBC WITH DIFFERENTIAL/PLATELET
Abs Immature Granulocytes: 0.05 K/uL (ref 0.00–0.07)
Basophils Absolute: 0.1 K/uL (ref 0.0–0.1)
Basophils Relative: 1 %
Eosinophils Absolute: 0.2 K/uL (ref 0.0–0.5)
Eosinophils Relative: 3 %
HCT: 50.9 % (ref 39.0–52.0)
Hemoglobin: 16.5 g/dL (ref 13.0–17.0)
Immature Granulocytes: 1 %
Lymphocytes Relative: 11 %
Lymphs Abs: 0.9 K/uL (ref 0.7–4.0)
MCH: 31.5 pg (ref 26.0–34.0)
MCHC: 32.4 g/dL (ref 30.0–36.0)
MCV: 97.3 fL (ref 80.0–100.0)
Monocytes Absolute: 0.6 K/uL (ref 0.1–1.0)
Monocytes Relative: 6 %
Neutro Abs: 7 K/uL (ref 1.7–7.7)
Neutrophils Relative %: 78 %
Platelets: 276 K/uL (ref 150–400)
RBC: 5.23 MIL/uL (ref 4.22–5.81)
RDW: 12.7 % (ref 11.5–15.5)
WBC: 8.8 K/uL (ref 4.0–10.5)
nRBC: 0 % (ref 0.0–0.2)

## 2024-08-14 LAB — BRAIN NATRIURETIC PEPTIDE: B Natriuretic Peptide: 41.3 pg/mL (ref 0.0–100.0)

## 2024-08-14 LAB — CBG MONITORING, ED: Glucose-Capillary: 244 mg/dL — ABNORMAL HIGH (ref 70–99)

## 2024-08-14 MED ORDER — STROKE: EARLY STAGES OF RECOVERY BOOK: Freq: Once | Status: AC

## 2024-08-14 MED ORDER — IOHEXOL 350 MG/ML SOLN
75.0000 mL | Freq: Once | INTRAVENOUS | Status: AC | PRN
  Administered 2024-08-14: 75 mL via INTRAVENOUS

## 2024-08-14 MED ORDER — STROKE: EARLY STAGES OF RECOVERY BOOK
Freq: Once | Status: AC
  Filled 2024-08-14: qty 1

## 2024-08-14 MED ORDER — ONDANSETRON HCL 4 MG/2ML IJ SOLN: 4.0000 mg | Freq: Four times a day (QID) | INTRAMUSCULAR | Status: DC | PRN

## 2024-08-14 MED ORDER — INSULIN ASPART 100 UNIT/ML IJ SOLN
0.0000 [IU] | Freq: Every day | INTRAMUSCULAR | Status: DC
  Administered 2024-08-14: 5 [IU] via SUBCUTANEOUS
  Filled 2024-08-14: qty 5

## 2024-08-14 MED ORDER — HEPARIN SODIUM (PORCINE) 5000 UNIT/ML IJ SOLN
5000.0000 [IU] | Freq: Three times a day (TID) | INTRAMUSCULAR | Status: DC
  Administered 2024-08-14 – 2024-08-15 (×2): 5000 [IU] via SUBCUTANEOUS
  Filled 2024-08-14 (×2): qty 1

## 2024-08-14 MED ORDER — SODIUM ZIRCONIUM CYCLOSILICATE 10 G PO PACK
10.0000 g | PACK | Freq: Once | ORAL | Status: AC
  Administered 2024-08-14: 10 g via ORAL
  Filled 2024-08-14: qty 1

## 2024-08-14 MED ORDER — ACETAMINOPHEN 650 MG RE SUPP: 650.0000 mg | RECTAL | Status: DC | PRN

## 2024-08-14 MED ORDER — ALBUTEROL SULFATE HFA 108 (90 BASE) MCG/ACT IN AERS
2.0000 | INHALATION_SPRAY | Freq: Once | RESPIRATORY_TRACT | Status: AC
  Administered 2024-08-14: 2 via RESPIRATORY_TRACT
  Filled 2024-08-14: qty 6.7

## 2024-08-14 MED ORDER — SENNOSIDES-DOCUSATE SODIUM 8.6-50 MG PO TABS: 1.0000 | ORAL_TABLET | Freq: Every evening | ORAL | Status: DC | PRN

## 2024-08-14 MED ORDER — ATORVASTATIN CALCIUM 80 MG PO TABS
80.0000 mg | ORAL_TABLET | Freq: Every day | ORAL | Status: DC
  Administered 2024-08-15: 80 mg via ORAL
  Filled 2024-08-14: qty 1

## 2024-08-14 MED ORDER — ASPIRIN 325 MG PO TABS
325.0000 mg | ORAL_TABLET | Freq: Every day | ORAL | Status: DC
  Administered 2024-08-14 – 2024-08-15 (×2): 325 mg via ORAL
  Filled 2024-08-14 (×2): qty 1

## 2024-08-14 MED ORDER — INSULIN ASPART 100 UNIT/ML IJ SOLN
0.0000 [IU] | Freq: Three times a day (TID) | INTRAMUSCULAR | Status: DC
  Administered 2024-08-15: 3 [IU] via SUBCUTANEOUS
  Filled 2024-08-14: qty 3

## 2024-08-14 MED ORDER — ACETAMINOPHEN 160 MG/5ML PO SOLN: 650.0000 mg | ORAL | Status: DC | PRN

## 2024-08-14 MED ORDER — LORAZEPAM 2 MG/ML IJ SOLN
1.0000 mg | Freq: Once | INTRAMUSCULAR | Status: AC | PRN
  Administered 2024-08-14: 1 mg via INTRAVENOUS
  Filled 2024-08-14: qty 1

## 2024-08-14 MED ORDER — ASPIRIN 300 MG RE SUPP: 300.0000 mg | Freq: Every day | RECTAL | Status: DC

## 2024-08-14 MED ORDER — ACETAMINOPHEN 325 MG PO TABS: 650.0000 mg | ORAL_TABLET | ORAL | Status: DC | PRN

## 2024-08-14 MED ORDER — CLOPIDOGREL BISULFATE 75 MG PO TABS
75.0000 mg | ORAL_TABLET | Freq: Every day | ORAL | Status: DC
  Administered 2024-08-14 – 2024-08-15 (×2): 75 mg via ORAL
  Filled 2024-08-14 (×2): qty 1

## 2024-08-14 MED ORDER — SODIUM CHLORIDE 0.9 % IV BOLUS
1000.0000 mL | Freq: Once | INTRAVENOUS | Status: AC
  Administered 2024-08-14: 1000 mL via INTRAVENOUS

## 2024-08-14 MED ORDER — LORAZEPAM 1 MG PO TABS: 1.0000 mg | ORAL_TABLET | Freq: Once | ORAL | Status: DC | PRN

## 2024-08-14 MED ORDER — FUROSEMIDE 10 MG/ML IJ SOLN
40.0000 mg | Freq: Once | INTRAMUSCULAR | Status: AC
  Administered 2024-08-14: 40 mg via INTRAVENOUS
  Filled 2024-08-14: qty 4

## 2024-08-14 NOTE — ED Notes (Signed)
 [facility] at bedside. Pt states he does not want to be admitted unless he will be having MRI done tonight. This RN stated that we are unsure of MRI time. PA spoke with pt regarding decision to leave AMA. EDP has discussed risks and dangers with pt. Pt verbalized understanding and signed AMA form.

## 2024-08-14 NOTE — ED Notes (Signed)
 Ambulatoory to restroom

## 2024-08-14 NOTE — ED Triage Notes (Signed)
 Reports dizziness with double vision starting this morning. States went to sleep normal and woke up around 0300 normal, but woke up around 0645 with symptoms. No other deficits noted.   LKN 0300.

## 2024-08-14 NOTE — ED Notes (Signed)
 Respiratory therapist at bedside.

## 2024-08-14 NOTE — ED Notes (Signed)
 ED Provider at bedside.

## 2024-08-14 NOTE — ED Notes (Signed)
 Patient transported to CT

## 2024-08-14 NOTE — Progress Notes (Signed)
 Patient is agitated, wants to leave as soon as possible but would like MRI first. Awaiting orders.

## 2024-08-14 NOTE — H&P (Signed)
 History and Physical    Jonathan Miller. FMW:[id number] DOB: August 26, 1956 DOA: 08/14/2024  PCP: Delbert Clam, MD  Patient coming from: Home  I have personally briefly reviewed patient's old medical records in [facility]  Chief Complaint: Double vision  HPI: Jonathan Miller. is a 68 y.o. male with medical history significant for CAD s/p DES mRCA 2021, PAD, T2DM, HTN, HLD, tobacco use who presented to the ED for evaluation of double vision.  Patient states he was in his usual state of health when he went to bed last night.  He woke up at 3 AM this morning to use the bathroom and did not have any new symptoms but when he woke up again at 6:45 AM he noticed new double vision.  He says the double vision only occurred when both eyes were open.  If he covered either his left or right eye the double vision went away.  He has not had any chest pain or shortness of breath.  He reports recent cough and chest congestion.  He denies any associated headache, nausea, vomiting, slurred speech, focal weakness or sensory change.  He denies any similar episodes in the past.  He denies any recent medication changes.  [facility] ED Course  Labs/Imaging on admission: I have personally reviewed following labs and imaging studies.  Initial vitals showed BP 165/70, pulse 80, RR 17, temp 97.8 F, SpO2 93% on room air.  Labs showed WBC 8.8, hemoglobin 16.5, platelets 276, sodium 134, potassium 5.3, bicarb 28, BUN 42, creatinine 1.56, serum glucose 316.  CT head without contrast negative for acute intracranial abnormality.  Remote right cerebellar infarct noted.  2 view chest x-ray showed mild diffuse pulmonary vascular congestion and trace bilateral pleural effusions.  Patient was given 1 L normal saline bolus and albuterol  inhaler.  EDP spoke with neurology who recommended medical admission to [facility] for stroke workup.  The hospitalist service was consulted to admit.  Review of  Systems: All systems reviewed and are negative except as documented in history of present illness above.   Past Medical History:  Diagnosis Date   Arthritis    right shoulder (02/09/2016)   Bell's palsy 01/2012; 02/09/2016   CAD (coronary artery disease)    a. inferior STEMI 04/2020 s/p DES to mRCA, EF 45-50% by echo.   Daily headache    recently (02/09/2016)   GERD (gastroesophageal reflux disease)    Hiatal hernia    History of hiatal hernia    HTN (hypertension)    Hyperlipidemia    Hypertension    Ischemic cardiomyopathy    Obesity    PAD (peripheral artery disease)    Renal insufficiency    noted 04/2020 admit   STEMI (ST elevation myocardial infarction) (HCC)    Tobacco abuse    Tobacco use    Type 2 diabetes mellitus (HCC)    Type II diabetes mellitus (HCC)     Past Surgical History:  Procedure Laterality Date   CORONARY/GRAFT ACUTE MI REVASCULARIZATION N/A 05/06/2020   Procedure: Coronary/Graft Acute MI Revascularization;  Surgeon: Court Dorn PARAS, MD;  Location: [facility] INVASIVE CV LAB;  Service: Cardiovascular;  Laterality: N/A;   LEFT HEART CATH AND CORONARY ANGIOGRAPHY N/A 05/06/2020   Procedure: LEFT HEART CATH AND CORONARY ANGIOGRAPHY;  Surgeon: Court Dorn PARAS, MD;  Location: [facility] INVASIVE CV LAB;  Service: Cardiovascular;  Laterality: N/A;   NO PAST SURGERIES      Social History: Patient reports ongoing tobacco  use.  1 pack of cigarettes will last him about 1-1/2 days.  No Known Allergies  Family History  Family history unknown: Yes     Prior to Admission medications   Medication Sig Start Date End Date Taking? Authorizing Provider  aspirin  EC 81 MG tablet Take 1 tablet (81 mg total) by mouth daily. Swallow whole. 05/07/20   Dunn, Dayna N, PA-C  atorvastatin  (LIPITOR ) 80 MG tablet TAKE 1 TABLET(80 MG) BY MOUTH DAILY 07/12/24   Court Dorn PARAS, MD  clopidogrel  (PLAVIX ) 75 MG tablet TAKE 1 TABLET(75 MG) BY MOUTH DAILY 01/03/24   Court Dorn PARAS, MD   insulin  NPH-regular Human (NOVOLIN  70/30) (70-30) 100 UNIT/ML injection Inject 10 Units into the skin 2 (two) times daily with a meal. 06/10/20   Delbert Clam, MD  Insulin  Pen Needle (PEN NEEDLES) 32G X 4 MM MISC 1 each by Does not apply route 2 (two) times daily. 05/07/20   Dunn, Dayna N, PA-C  lisinopril  (ZESTRIL ) 5 MG tablet TAKE 1 TABLET(5 MG) BY MOUTH DAILY 07/12/24   Court Dorn PARAS, MD  metFORMIN  (GLUCOPHAGE -XR) 750 MG 24 hr tablet Take 750 mg by mouth daily.    [provider]  metoprolol  tartrate (LOPRESSOR ) 25 MG tablet TAKE 1 TABLET(25 MG) BY MOUTH TWICE DAILY 01/20/24   Court Dorn PARAS, MD  nicotine  (NICODERM CQ  - DOSED IN MG/24 HOURS) 14 mg/24hr patch Place a patch (14 mg) on your skin daily for 6 weeks 05/19/20   Goodrich, Callie E, PA-C  nitroGLYCERIN  (NITROSTAT ) 0.4 MG SL tablet Place 1 tablet (0.4 mg total) under the tongue every 5 (five) minutes as needed for chest pain (up to 3 doses max). 05/07/20   Dunn, Dayna N, PA-C  pantoprazole  (PROTONIX ) 40 MG tablet TAKE 1 TABLET(40 MG) BY MOUTH DAILY 11/27/21   Court Dorn PARAS, MD    Physical Exam: Vitals:   08/14/24 1600 08/14/24 1630 08/14/24 1741 08/14/24 1828  BP: 130/83 (!) 142/96  (!) 142/99  Pulse:    82  Resp: (!) 23 18  15   Temp:   98 F (36.7 C) 97.9 F (36.6 C)  TempSrc:   Oral Oral  SpO2:    94%   Constitutional: Resting in bed, NAD, calm, comfortable Eyes: PERRL, EOM limited with right 3rd nerve palsy ENMT: Mucous membranes are moist. Posterior pharynx clear of any exudate or lesions.Normal dentition.  Neck: normal, supple, no masses. Respiratory: Bibasilar inspiratory crackles with faint end expiratory wheezing. Normal respiratory effort. No accessory muscle use.  Cardiovascular: Regular rate and rhythm, no murmurs / rubs / gallops.  +1 bilateral lower extremity edema. 2+ pedal pulses. Abdomen: no tenderness, no masses palpated. Musculoskeletal: no clubbing / cyanosis. No joint deformity upper and  lower extremities. Good ROM, no contractures. Normal muscle tone.  Skin: no rashes, lesions, ulcers. No induration Neurologic: Sensation intact. Strength 5/5 in all 4.  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 Psychiatric: Normal judgment and insight. Alert and oriented x 3. Normal mood.   EKG: Personally reviewed. Sinus rhythm, rate 77, no acute ischemic changes.  Assessment/Plan Principal Problem:   Stroke-like symptom Active Problems:   Acute on chronic heart failure with preserved ejection fraction (HFpEF, >= 50%) (HCC)   AKI (acute kidney injury)   Insulin  dependent type 2 diabetes mellitus (HCC)   Hypertension associated with diabetes (HCC)   Hyperlipidemia associated with type 2 diabetes mellitus (HCC)   Tobacco use   Hyperkalemia   Jonathan Miller. is  a 68 y.o. male with medical history significant for CAD s/p DES mRCA 2021, PAD, T2DM, HTN, HLD, tobacco use who presented with new onset diplopia and is admitted for stroke workup.  Assessment and Plan: Diplopia: Patient presenting for evaluation of new onset diplopia.  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  Neurology consulted and patient admitted for further stroke workup. - CT head negative for acute findings, remote right cerebellar infarction seen - Obtain MRI brain - CTA head/neck - Echocardiogram - Continued on aspirin /Plavix  per neurology - Keep on telemetry, continue neurochecks - PT/OT/SLP eval - Check lipid panel and hemoglobin A1c - Continue atorvastatin   Acute on chronic HFpEF: Patient appears volume overloaded on admission with peripheral edema, pulmonary vascular congestion on CXR.  Last TTE 10/24/2020 showed EF improved to 50-55% compared to previous 45-50%. - Check BNP - IV Lasix  40 mg once now - Update echocardiogram - Strict I&O's and daily weights  Acute kidney injury: Presumed AKI with creatinine 1.56 on admission compared to previous baseline 1.1-1.3 from labs in 2021.   Appears volume overloaded.-IV Lasix  as above.  Holding metformin  and lisinopril .  Repeat labs in AM.  Hyperkalemia: Mild with K 5.3.  Giving IV Lasix  as above and 1 dose of Lokelma .  CAD s/p DES mRCA 2021: Stable, denies chest pain.  Continue aspirin , Plavix , atorvastatin .  Type 2 diabetes: Placed on SSI.  Hypertension: Allowing permissive hypertension for now.  Hyperlipidemia: Continue atorvastatin .  Tobacco use: Patient reports smoking 1 pack every 1-2 days.   DVT prophylaxis: heparin  injection 5,000 Units Start: 08/14/24 2200 Code Status: Full code, confirmed with patient on admission Family Communication: Spouse at bedside Disposition Plan: From home, dispo pending clinical progress Consults called: Neurology Severity of Illness: The appropriate patient status for this patient is OBSERVATION. Observation status is judged to be reasonable and necessary in order to provide the required intensity of service to ensure the patient's safety. The patient's presenting symptoms, physical exam findings, and initial radiographic and laboratory data in the context of their medical condition is felt to place them at decreased risk for further clinical deterioration. Furthermore, it is anticipated that the patient will be medically stable for discharge from the hospital within 2 midnights of admission.   Jorie Blanch MD [facility]  If 7PM-7AM, please contact night-coverage [email or web address]  08/14/2024, 8:07 PM

## 2024-08-14 NOTE — Hospital Course (Signed)
 Jonathan Miller. is a 68 y.o. male with medical history significant for CAD s/p DES mRCA 2021, PAD, T2DM, HTN, HLD, tobacco use who presented with new onset diplopia and is admitted for stroke workup.

## 2024-08-14 NOTE — ED Notes (Signed)
 Called Jonathan Miller at [facility] for transport 16:31 TC

## 2024-08-14 NOTE — Consult Note (Signed)
 NEUROLOGY CONSULT NOTE   Date of service: August 14, 2024 Patient Name: Jonathan Miller. MRN:  [id number] DOB:  01-Mar-1956 Chief Complaint: Diplopia Requesting Provider: Vernon Ranks, MD  History of Present Illness  Jonathan Miller. is a 68 y.o. male with hx of CAD, hypertension, hyperlipidemia, peripheral arterial disease, tobacco abuse, diabetes presenting to the freestanding ER, with complaints of diplopia and dizziness that started upon waking up at 545 this morning.  He said he woke up at 3 this morning to use the restroom and did not have any symptoms but when he woke up again at 545, he felt imbalanced and dizzy and was seeing double when both eyes were open.  If he covered 1 eye, the double vision went away.  No chest pain shortness of breath nausea vomiting.  No tingling numbness weakness.  No headaches.  No prior history of strokes.  Has had CAD for which she has been treated with stenting.  Takes aspirin  81 daily.  LKW: 5:45 AM Modified rankin score: 0-Completely asymptomatic and back to baseline post- stroke IV Thrombolysis: Outside the window EVT: Likely small vessel etiology stroke  NIHSS components Score: Comment  1a Level of Conscious 0[x]  1[]  2[]  3[]      1b LOC Questions 0[x]  1[]  2[]       1c LOC Commands 0[x]  1[]  2[]       2 Best Gaze 0[]  1[x]  2[]       3 Visual 0[x]  1[]  2[]  3[]      4 Facial Palsy 0[x]  1[]  2[]  3[]      5a Motor Arm - left 0[x]  1[]  2[]  3[]  4[]  UN[]    5b Motor Arm - Right 0[x]  1[]  2[]  3[]  4[]  UN[]    6a Motor Leg - Left 0[x]  1[]  2[]  3[]  4[]  UN[]    6b Motor Leg - Right 0[x]  1[]  2[]  3[]  4[]  UN[]    7 Limb Ataxia 0[x]  1[]  2[]  UN[]      8 Sensory 0[x]  1[]  2[]  UN[]      9 Best Language 0[x]  1[]  2[]  3[]      10 Dysarthria 0[x]  1[]  2[]  UN[]      11 Extinct. and Inattention 0[x]  1[]  2[]       TOTAL: 1      ROS  Comprehensive ROS performed and pertinent positives documented in HPI  Past History   Past Medical History:  Diagnosis Date   Arthritis    right  shoulder (02/09/2016)   Bell's palsy 01/2012; 02/09/2016   CAD (coronary artery disease)    a. inferior STEMI 04/2020 s/p DES to mRCA, EF 45-50% by echo.   Daily headache    recently (02/09/2016)   GERD (gastroesophageal reflux disease)    Hiatal hernia    History of hiatal hernia    HTN (hypertension)    Hyperlipidemia    Hypertension    Ischemic cardiomyopathy    Obesity    PAD (peripheral artery disease)    Renal insufficiency    noted 04/2020 admit   STEMI (ST elevation myocardial infarction) (HCC)    Tobacco abuse    Tobacco use    Type 2 diabetes mellitus (HCC)    Type II diabetes mellitus (HCC)     Past Surgical History:  Procedure Laterality Date   CORONARY/GRAFT ACUTE MI REVASCULARIZATION N/A 05/06/2020   Procedure: Coronary/Graft Acute MI Revascularization;  Surgeon: Court Dorn PARAS, MD;  Location: [facility] INVASIVE CV LAB;  Service: Cardiovascular;  Laterality: N/A;   LEFT HEART CATH AND CORONARY ANGIOGRAPHY N/A 05/06/2020   Procedure: LEFT  HEART CATH AND CORONARY ANGIOGRAPHY;  Surgeon: Court Dorn PARAS, MD;  Location: [facility] INVASIVE CV LAB;  Service: Cardiovascular;  Laterality: N/A;   NO PAST SURGERIES      Family History: Family History  Family history unknown: Yes    Social History  reports that he has been smoking cigarettes. He has a 25 pack-year smoking history. He has never used smokeless tobacco. He reports current alcohol use. He reports that he does not use drugs.  No Known Allergies  Medications  No current facility-administered medications for this encounter.  Vitals   Vitals:   Aug 29, 2024 1600 08-29-24 1630 2024/08/29 1741 08-29-2024 1828  BP: 130/83 (!) 142/96  (!) 142/99  Pulse:    82  Resp: (!) 23 18  15   Temp:   98 F (36.7 C) 97.9 F (36.6 C)  TempSrc:   Oral Oral  SpO2:    94%    There is no height or weight on file to calculate BMI.   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, visual fields full, facial sensation intact, face symmetric, tongue and palate midline Motor examination with no drift in any of the fours Sensation intact to light touch Coordination: No dysmetria  Labs/Imaging/Neurodiagnostic studies   CBC:  Recent Labs  Lab 08/29/24 1222 08-29-2024 1237  WBC 8.8  --   NEUTROABS 7.0  --   HGB 16.5 16.3  HCT 50.9 48.0  MCV 97.3  --   PLT 276  --    Basic Metabolic Panel:  Lab Results  Component Value Date   NA 132 (L) 08/29/2024   K 4.9 08/29/2024   CO2 28 29-Aug-2024   GLUCOSE 316 (H) 29-Aug-2024   BUN 42 (H) 08-29-24   CREATININE 1.56 (H) 2024/08/29   CALCIUM  9.8 August 29, 2024   GFRNONAA 48 (L) 08/29/24   GFRAA >60 05/07/2020   Lipid Panel:  Lab Results  Component Value Date   LDLCALC 33 09/30/2021   HgbA1c:  Lab Results  Component Value Date   HGBA1C 11.8 (H) 05/06/2020   Urine Drug Screen:     Component Value Date/Time   LABOPIA NONE DETECTED 01/22/2012 0143   COCAINSCRNUR NONE DETECTED 01/22/2012 0143   LABBENZ NONE DETECTED 01/22/2012 0143   AMPHETMU NONE DETECTED 01/22/2012 0143   THCU NONE DETECTED 01/22/2012 0143   LABBARB NONE DETECTED 01/22/2012 0143    Lab Results  Component Value Date   INR 1.0 08/31/2020   APTT  Lab Results  Component Value Date   APTT 58 (H) 05/06/2020   CT Head without contrast(Personally reviewed): No acute findings.  Remote right cerebellar infarction  ASSESSMENT   Jonathan Miller. is a 68 y.o. male with multiple cerebrovascular risk factors presenting for sudden onset of diplopia.  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 until cleared by bedside swallow evaluation Stroke team to follow Preliminary plan relayed to the hospitalist Dr. Ofilia ______________________________________________________________________    Signed, Eligio Lav, MD [facility]

## 2024-08-14 NOTE — ED Notes (Signed)
 MD spoke with pt and pt decided to go with [facility].

## 2024-08-14 NOTE — ED Provider Notes (Signed)
 [place] EMERGENCY DEPARTMENT AT [facility] Provider Note   CSN: [id number] Arrival date & time: 08/14/24  1135     Patient presents with: Diplopia   Jonathan Miller. is a 68 y.o. male with past medical history of prior stroke, diabetes, hypertension, tobacco abuse, hyperlipidemia, STEMI, PAD who presents emergency department for evaluation of diplopia and dizziness.  Patient reports when he awoke this morning at 545, he reported that he was seeing double vision and felt dizzy.  He described the sensation as feeling off balance.  Patient awoke around 3:00 this morning to use the bathroom, and did not have any of the symptoms.  This was patient's last known normal.  Patient reports when he covers 1 eye at a time he does not have any diplopia.  However, when he is looking in front of him with both eyes, he does report diplopia.  He denies any headache, fever, body aches, chest pain, shortness of breath, abdominal pain, nausea, vomiting.  Patient reported that he took his medications this morning.  He stated he takes daily aspirin , statin, blood pressure medication.  He does not currently take any additional blood thinners.  Patient denies any falls or LOC.  HPI     Prior to Admission medications   Medication Sig Start Date End Date Taking? Authorizing Provider  aspirin  EC 81 MG tablet Take 1 tablet (81 mg total) by mouth daily. Swallow whole. 05/07/20   Dunn, Dayna N, PA-C  atorvastatin  (LIPITOR ) 80 MG tablet TAKE 1 TABLET(80 MG) BY MOUTH DAILY 07/12/24   Court Dorn PARAS, MD  clopidogrel  (PLAVIX ) 75 MG tablet TAKE 1 TABLET(75 MG) BY MOUTH DAILY 01/03/24   Court Dorn PARAS, MD  insulin  NPH-regular Human (NOVOLIN  70/30) (70-30) 100 UNIT/ML injection Inject 10 Units into the skin 2 (two) times daily with a meal. 06/10/20   Delbert Clam, MD  Insulin  Pen Needle (PEN NEEDLES) 32G X 4 MM MISC 1 each by Does not apply route 2 (two) times daily. 05/07/20   Dunn, Dayna N, PA-C   lisinopril  (ZESTRIL ) 5 MG tablet TAKE 1 TABLET(5 MG) BY MOUTH DAILY 07/12/24   Court Dorn PARAS, MD  metFORMIN  (GLUCOPHAGE -XR) 750 MG 24 hr tablet Take 750 mg by mouth daily.    [provider]  metoprolol  tartrate (LOPRESSOR ) 25 MG tablet TAKE 1 TABLET(25 MG) BY MOUTH TWICE DAILY 01/20/24   Court Dorn PARAS, MD  nicotine  (NICODERM CQ  - DOSED IN MG/24 HOURS) 14 mg/24hr patch Place a patch (14 mg) on your skin daily for 6 weeks 05/19/20   Goodrich, Callie E, PA-C  nitroGLYCERIN  (NITROSTAT ) 0.4 MG SL tablet Place 1 tablet (0.4 mg total) under the tongue every 5 (five) minutes as needed for chest pain (up to 3 doses max). 05/07/20   Dunn, Dayna N, PA-C  pantoprazole  (PROTONIX ) 40 MG tablet TAKE 1 TABLET(40 MG) BY MOUTH DAILY 11/27/21   Court Dorn PARAS, MD    Allergies: Patient has no known allergies.    Review of Systems  Eyes:  Positive for visual disturbance.  Neurological:  Positive for dizziness.    Updated Vital Signs BP (!) 142/99 (BP Location: Left Arm)   Pulse 82   Temp 97.9 F (36.6 C) (Oral)   Resp 15   SpO2 94%   Physical Exam Vitals and nursing note reviewed.  Constitutional:      Appearance: Normal appearance.  HENT:     Head: Normocephalic and atraumatic.     Mouth/Throat:  Mouth: Mucous membranes are moist.  Eyes:     General: No scleral icterus.       Right eye: No discharge.        Left eye: No discharge.     Extraocular Movements: Extraocular movements intact.     Conjunctiva/sclera: Conjunctivae normal.     Pupils: Pupils are equal, round, and reactive to light.     Comments: EOM's intact.  Left-sided nystagmus noted on physical exam.  Patient's pupils are PERRL.  Cardiovascular:     Rate and Rhythm: Normal rate and regular rhythm.     Pulses: Normal pulses.  Pulmonary:     Effort: Pulmonary effort is normal.     Breath sounds: Normal breath sounds.  Abdominal:     General: There is no distension.     Tenderness: There is no abdominal  tenderness.  Musculoskeletal:        General: No deformity.     Cervical back: Normal range of motion.  Skin:    General: Skin is warm and dry.     Capillary Refill: Capillary refill takes less than 2 seconds.  Neurological:     Mental Status: He is alert.     Cranial Nerves: Cranial nerve deficit present.     Motor: No weakness.     Comments: Patient speaks in full goal oriented sentences. Cranial nerves 4-12 grossly intact.  Cranial nerve III is lacking adduction on the right eye.  Left eye adduction annually.  DTRs normal and symmetric. Equal grip strength bilateral with 5/5 strength against resistance in upper and lower extremities. No sensory or motor deficits appreciated.   Psychiatric:        Mood and Affect: Mood normal.     (all labs ordered are listed, but only abnormal results are displayed) Labs Reviewed  BASIC METABOLIC PANEL WITH GFR - Abnormal; Notable for the following components:      Result Value   Sodium 134 (*)    Potassium 5.3 (*)    Glucose, Bld 316 (*)    BUN 42 (*)    Creatinine, Ser 1.56 (*)    GFR, Estimated 48 (*)    All other components within normal limits  URINALYSIS, ROUTINE W REFLEX MICROSCOPIC - Abnormal; Notable for the following components:   Glucose, UA >1,000 (*)    Hgb urine dipstick TRACE (*)    Protein, ur >300 (*)    All other components within normal limits  CBG MONITORING, ED - Abnormal; Notable for the following components:   Glucose-Capillary 244 (*)    All other components within normal limits  I-STAT VENOUS BLOOD GAS, ED - Abnormal; Notable for the following components:   pO2, Ven 49 (*)    Sodium 132 (*)    All other components within normal limits  CBC WITH DIFFERENTIAL/PLATELET    EKG: EKG Interpretation Date/Time:  Tuesday August 14 2024 11:43:49 EST Ventricular Rate:  77 PR Interval:  155 QRS Duration:  102 QT Interval:  382 QTC Calculation: 433 R Axis:   36  Text Interpretation: Sinus rhythm No significant  change since last tracing Confirmed by Levander Houston [phone]) on 08/14/2024 12:25:00 PM  Radiology: [facility] Chest 2 View Result Date: 08/14/2024 CLINICAL DATA:  low spo2 EXAM: CHEST - 2 VIEW COMPARISON:  08/31/2020. FINDINGS: There is mild diffuse pulmonary vascular congestion. Bilateral lung fields are clear. There is minimal blunting of bilateral posterior costophrenic angles, suggesting trace pleural effusion. Normal cardio-mediastinal silhouette. No acute osseous abnormalities. The soft  tissues are within normal limits. IMPRESSION: Mild diffuse pulmonary vascular congestion and trace bilateral pleural effusions. Electronically Signed   By: Ree Molt M.D.   On: 08/14/2024 13:51   CT Head Wo Contrast Result Date: 08/14/2024 EXAM: CT HEAD WITHOUT CONTRAST 08/14/2024 01:20:00 PM TECHNIQUE: CT of the head was performed without the administration of intravenous contrast. Automated exposure control, iterative reconstruction, and/or weight based adjustment of the mA/kV was utilized to reduce the radiation dose to as low as reasonably achievable. COMPARISON: CT Head Feb 08, 2016 CLINICAL HISTORY: Syncope/presyncope, cerebrovascular cause suspected FINDINGS: BRAIN AND VENTRICLES: No acute hemorrhage. No evidence of acute infarct. Remote right cerebellar infarct. No hydrocephalus. No extra-axial collection. No mass effect or midline shift. ORBITS: No acute abnormality. SINUSES: No acute abnormality. SOFT TISSUES AND SKULL: No acute soft tissue abnormality. No skull fracture. IMPRESSION: 1. No acute intracranial abnormality. 2. Remote right cerebellar infarct. Electronically signed by: Gilmore Molt MD 08/14/2024 01:35 PM EST RP Workstation: [id number]    .Critical Care  Performed by: Torrence Marry RAMAN, PA-C Authorized by: Torrence Marry RAMAN, PA-C   Critical care provider statement:    Critical care time (minutes):  50   Critical care was necessary to treat or prevent imminent or life-threatening  deterioration of the following conditions:  Circulatory failure and dehydration   Critical care was time spent personally by me on the following activities:  Blood draw for specimens, development of treatment plan with patient or surrogate, discussions with consultants, discussions with primary provider, evaluation of patient's response to treatment, examination of patient, obtaining history from patient or surrogate, review of old charts, re-evaluation of patient's condition, pulse oximetry, ordering and review of radiographic studies, ordering and review of laboratory studies and ordering and performing treatments and interventions   I assumed direction of critical care for this patient from another provider in my specialty: no     Care discussed with: admitting provider      Medications Ordered in the ED  albuterol  (VENTOLIN  HFA) 108 (90 Base) MCG/ACT inhaler 2 puff (2 puffs Inhalation Given 08/14/24 1333)  sodium chloride  0.9 % bolus 1,000 mL (0 mLs Intravenous Stopped 08/14/24 1630)                                  Medical Decision Making Amount and/or Complexity of Data Reviewed Labs: ordered. Radiology: ordered.  Risk Prescription drug management. Decision regarding hospitalization.   This patient presents to the ED for concern of diplopia and dizziness, this involves an extensive number of treatment options, and is a complaint that carries with it a high risk of complications and morbidity.   Differential diagnosis includes: TIA, posterior stroke, hemorrhagic stroke, ischemic stroke, medication induced  Co morbidities:  diabetes, prior stroke, per lipidemia, hypertension, tobacco abuse, PAD   Lab Tests:  I Ordered, and personally interpreted labs.  The pertinent results include:    - Potassium: 5.3 - BUN: 42 - Creatinine: 1.56  Imaging Studies:  I ordered imaging studies including chest x-ray and head CT I independently visualized and interpreted imaging which showed    - X-ray: Mild diffuse pulmonary vascular congestion and trace bilateral  pleural effusions.   -  1. No acute intracranial abnormality.  2. Remote right cerebellar infarct.   I agree with the radiologist interpretation  Cardiac Monitoring/ECG:  The patient was maintained on a cardiac monitor.  I personally viewed and interpreted the cardiac monitored which  showed an underlying rhythm of: Sinus rhythm  Medicines ordered and prescription drug management:  I ordered medication including  Medications  albuterol  (VENTOLIN  HFA) 108 (90 Base) MCG/ACT inhaler 2 puff (2 puffs Inhalation Given 08/14/24 1333)  sodium chloride  0.9 % bolus 1,000 mL (0 mLs Intravenous Stopped 08/14/24 1630)   for possible dehydration Reevaluation of the patient after these medicines showed that the patient improved I have reviewed the patients home medicines and have made adjustments as needed  Test Considered:   MRI  Critical Interventions:   multiple consultations - Greater than 45 minutes spent providing patient education  Consultations Obtained: Neurology Hospitalist  Problem List / ED Course:     ICD-10-CM   1. Injury of right oculomotor nerve, initial encounter  S04.11XA     2. AKI (acute kidney injury)  N17.9       MDM: 68 year old male who presents emergency department for evaluation of diplopia.  Patient's last known normal was around 3:00 this morning when he got up to use the bathroom.  Patient first noticed diplopia when he woke up around 5:45 AM.  He reports normal vision when he covers his right eye and normal vision when he covers his left eye, but the diplopia is apparent when both of his eyes are open.  Physical exam reveals his right eye not being able to adduct.  Patient is unable to follow my finger with just his eyes when he turns to the left.  He is able to do this when he turns to the right.  Additionally, his left eye is drooped slightly lower than the right.  His workup  originally revealed a possible AKI.  His creatinine is 1.56 and BUN is 42.  However, patient has not had labs done in approximately 4 years so I am unable to confirm this.  I did order a liter of IV fluids for rehydration.  Patient does have a blood glucose of 316.  However there is no pH abnormality.  His pH is 7.39.  I did obtain a chest x-ray because patient's oxygen saturation was sitting between 88 and 91%.  He does not have a history of breathing abnormalities and he did not report shortness of breath.  However, he does have a smoking history.  His chest x-ray showed mild diffuse pulmonary vascular congestion and trace bilateral pleural effusions.  He was given 2 puffs of albuterol .  However, I did have concerned that the patient was unable to appropriately move his right eye with abduction.  Therefore I ordered a CT head.  His CT head showed no acute intracranial abnormality.  I do believe patient would benefit from obtaining an MRI at [facility] due to the acute change in his vision.  I am concerned that the patient may be having a stroke.  I saw this patient in conjunction with Dr. Levander, who is in agreement.  She spoke with the neurologist, who recommends patient be admitted to the hospitalist to obtain the MRI.  I spoke with Dr. Vernon who accepted this patient.  After patient was accepted by medicine, patient was threatening to leave AMA because he felt like he was laying in the bed for too long.  I did encourage the patient that he needs to stay because I have concerns that he may be having an acute stroke.  Patient initially verbalizes understanding to this and was agreeable to hospital admission.  At the time the paramedics arrived, patient was again refusing admission stating that he  was hungry and felt his MRI was not going to be completed tonight.  My attending, Dr.Penna then went to speak with the patient and was able to convince him for admission.   Dispostion:  After consideration of the  diagnostic results and the patients response to treatment, I feel that the patient would benefit from hospital admission for continued stroke workup.    Final diagnoses:  Injury of right oculomotor nerve, initial encounter  AKI (acute kidney injury)    ED Discharge Orders     None          Torrence Marry GORMAN DEVONNA 08/14/24 RONOLD Levander Houston, MD 08/15/24 [phone]

## 2024-08-15 ENCOUNTER — Observation Stay (HOSPITAL_COMMUNITY)

## 2024-08-15 DIAGNOSIS — S0411XA Injury of oculomotor nerve, right side, initial encounter: Secondary | ICD-10-CM | POA: Diagnosis not present

## 2024-08-15 DIAGNOSIS — R2990 Unspecified symptoms and signs involving the nervous system: Secondary | ICD-10-CM | POA: Diagnosis not present

## 2024-08-15 DIAGNOSIS — F17210 Nicotine dependence, cigarettes, uncomplicated: Secondary | ICD-10-CM

## 2024-08-15 DIAGNOSIS — I10 Essential (primary) hypertension: Secondary | ICD-10-CM | POA: Diagnosis not present

## 2024-08-15 DIAGNOSIS — E785 Hyperlipidemia, unspecified: Secondary | ICD-10-CM | POA: Diagnosis not present

## 2024-08-15 DIAGNOSIS — I639 Cerebral infarction, unspecified: Secondary | ICD-10-CM | POA: Diagnosis not present

## 2024-08-15 DIAGNOSIS — I771 Stricture of artery: Secondary | ICD-10-CM | POA: Diagnosis not present

## 2024-08-15 DIAGNOSIS — I5033 Acute on chronic diastolic (congestive) heart failure: Secondary | ICD-10-CM | POA: Diagnosis not present

## 2024-08-15 DIAGNOSIS — E119 Type 2 diabetes mellitus without complications: Secondary | ICD-10-CM

## 2024-08-15 DIAGNOSIS — N179 Acute kidney failure, unspecified: Secondary | ICD-10-CM

## 2024-08-15 DIAGNOSIS — Z7984 Long term (current) use of oral hypoglycemic drugs: Secondary | ICD-10-CM

## 2024-08-15 LAB — BASIC METABOLIC PANEL WITH GFR
Anion gap: 11 (ref 5–15)
BUN: 41 mg/dL — ABNORMAL HIGH (ref 8–23)
CO2: 27 mmol/L (ref 22–32)
Calcium: 8.8 mg/dL — ABNORMAL LOW (ref 8.9–10.3)
Chloride: 100 mmol/L (ref 98–111)
Creatinine, Ser: 1.81 mg/dL — ABNORMAL HIGH (ref 0.61–1.24)
GFR, Estimated: 40 mL/min — ABNORMAL LOW (ref >60)
Glucose, Bld: 257 mg/dL — ABNORMAL HIGH (ref 70–99)
Potassium: 4.6 mmol/L (ref 3.5–5.1)
Sodium: 138 mmol/L (ref 135–145)

## 2024-08-15 LAB — ECHOCARDIOGRAM COMPLETE
AR max vel: 2.58 cm2
AV Area VTI: 2.28 cm2
AV Area mean vel: 2.67 cm2
AV Mean grad: 3 mmHg
AV Peak grad: 4.7 mmHg
Ao pk vel: 1.08 m/s
Area-P 1/2: 4.8 cm2
Calc EF: 46.8 %
MV VTI: 2.85 cm2
S' Lateral: 2.8 cm
Single Plane A2C EF: 46.4 %
Single Plane A4C EF: 42.2 %
Weight: 3107.6 [oz_av]

## 2024-08-15 LAB — HEPATIC FUNCTION PANEL
ALT: 17 U/L (ref 0–44)
AST: 18 U/L (ref 15–41)
Albumin: 2.4 g/dL — ABNORMAL LOW (ref 3.5–5.0)
Alkaline Phosphatase: 53 U/L (ref 38–126)
Bilirubin, Direct: 0.1 mg/dL (ref 0.0–0.2)
Indirect Bilirubin: 0.3 mg/dL (ref 0.3–0.9)
Total Bilirubin: 0.4 mg/dL (ref 0.0–1.2)
Total Protein: 5.4 g/dL — ABNORMAL LOW (ref 6.5–8.1)

## 2024-08-15 LAB — CBC
HCT: 49.1 % (ref 39.0–52.0)
Hemoglobin: 16.1 g/dL (ref 13.0–17.0)
MCH: 31.7 pg (ref 26.0–34.0)
MCHC: 32.8 g/dL (ref 30.0–36.0)
MCV: 96.7 fL (ref 80.0–100.0)
Platelets: 278 K/uL (ref 150–400)
RBC: 5.08 MIL/uL (ref 4.22–5.81)
RDW: 12.5 % (ref 11.5–15.5)
WBC: 8.8 K/uL (ref 4.0–10.5)
nRBC: 0 % (ref 0.0–0.2)

## 2024-08-15 LAB — LIPID PANEL
Cholesterol: 252 mg/dL — ABNORMAL HIGH (ref 0–200)
HDL: 63 mg/dL (ref >40)
LDL Cholesterol: 131 mg/dL — ABNORMAL HIGH (ref 0–99)
Total CHOL/HDL Ratio: 4 ratio
Triglycerides: 290 mg/dL — ABNORMAL HIGH (ref <150)
VLDL: 58 mg/dL — ABNORMAL HIGH (ref 0–40)

## 2024-08-15 LAB — HIV ANTIBODY (ROUTINE TESTING W REFLEX): HIV Screen 4th Generation wRfx: NONREACTIVE

## 2024-08-15 LAB — GLUCOSE, CAPILLARY: Glucose-Capillary: 176 mg/dL — ABNORMAL HIGH (ref 70–99)

## 2024-08-15 LAB — HEMOGLOBIN A1C
Hgb A1c MFr Bld: 11.8 % — ABNORMAL HIGH (ref 4.8–5.6)
Mean Plasma Glucose: 292 mg/dL

## 2024-08-15 MED ORDER — PERFLUTREN LIPID MICROSPHERE
1.0000 mL | INTRAVENOUS | Status: AC | PRN
  Administered 2024-08-15: 2 mL via INTRAVENOUS

## 2024-08-15 NOTE — Evaluation (Signed)
 Occupational Therapy Evaluation Patient Details Name: Jonathan Miller. MRN: [id number] DOB: 11-Oct-1955 Today's Date: 08/15/2024   History of Present Illness   Pt is a 68 y/o M who presented to [facility] for dizziness and diplopia on 08/14/24. 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 PMHx: HTN, HLD, PAD, DMII, Bell's palsy, CAD s/p STEMI, GERD, obesity.     Clinical Impressions Pt greeted seated in chair in hallway, agreeable for OT session. 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 Lives with his wife and has good support upon d/c. Functionally he presents today at his baseline. Visual assessment WNL. Denies concerns upon returning home. No further acute nor post-acute OT needs identified at this time. OT To sign-off.     If plan is discharge home, recommend the following:    (return home independently)     Functional Status Assessment         Equipment Recommendations   None recommended by OT     Recommendations for Other Services         Precautions/Restrictions   Precautions Precautions: None Restrictions Weight Bearing Restrictions Per Provider Order: No     Mobility Bed Mobility               General bed mobility comments: not assessed - pt received OOB throughout session, left seated EOB    Transfers Overall transfer level: Independent Equipment used: None                      Balance Overall balance assessment: Independent                                         ADL either performed or assessed with clinical judgement   ADL Overall ADL's : At baseline                                       General ADL Comments: indep     Vision Baseline Vision/History: 0 No visual deficits Ability to See in Adequate Light: 0 Adequate Patient Visual Report: No change from baseline (back to baseline  per pt report) Vision Assessment?: Yes Eye Alignment: Within Functional Limits Ocular Range of Motion: Within Functional Limits Alignment/Gaze Preference: Within Defined Limits Tracking/Visual Pursuits: Able to track stimulus in all quads without difficulty Saccades: Within functional limits Convergence: Within functional limits Visual Fields: No apparent deficits Additional Comments: WNL eye assessment     Perception Perception: Within Functional Limits       Praxis         Pertinent Vitals/Pain Pain Assessment Pain Assessment: No/denies pain     Extremity/Trunk Assessment Upper Extremity Assessment Upper Extremity Assessment: Overall WFL for tasks assessed   Lower Extremity Assessment Lower Extremity Assessment: Overall WFL for tasks assessed       Communication Communication Communication: No apparent difficulties   Cognition Arousal: Alert Behavior During Therapy: WFL for tasks assessed/performed Cognition: No apparent impairments                               Following commands: Intact       Cueing  General Comments  Cueing Techniques: Verbal cues  wife present at bedside   Exercises     Shoulder Instructions      Home Living Family/patient expects to be discharged to:: Private residence Living Arrangements: Spouse/significant other Available Help at Discharge: Family;Available 24 hours/day (wife) Type of Home: House Home Access: Stairs to enter [employer] of Steps: 3 STE   Home Layout: One level     Bathroom Shower/Tub: Tub/shower unit         Home Equipment: None          Prior Functioning/Environment Prior Level of Function : Independent/Modified Independent;Driving;Working/employed (works part time education officer, 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 [place])             Mobility Comments: no AD PTA ADLs Comments: indep, driving, works part time    OT Problem List:     OT Treatment/Interventions:         OT Goals(Current goals can be found in the care plan section)   Acute Rehab OT Goals Patient Stated Goal: go home and get back to work   OT Frequency:       Co-evaluation              AM-PAC OT 6 Clicks Daily Activity     Outcome Measure Help from another person eating meals?: None Help from another person taking care of personal grooming?: None Help from another person toileting, which includes using toliet, bedpan, or urinal?: None Help from another person bathing (including washing, rinsing, drying)?: None Help from another person to put on and taking off regular upper body clothing?: None Help from another person to put on and taking off regular lower body clothing?: None 6 Click Score: 24   End of Session Nurse Communication: Mobility status  Activity Tolerance: Patient tolerated treatment well Patient left: with call bell/phone within reach;with family/visitor present (sitting EOB)  OT Visit Diagnosis: Other abnormalities of gait and mobility (R26.89)                Time: 9252-9196 OT Time Calculation (min): 16 min Charges:  OT General Charges $OT Visit: 1 Visit OT Evaluation $OT Eval Low Complexity: 1 Low  Dareld Mcauliffe M. [place], OTR/L [facility] Acute Rehabilitation Services [phone] Secure Chat Preferred  Rikki Burma 08/15/2024, 9:27 AM

## 2024-08-15 NOTE — Plan of Care (Signed)

## 2024-08-15 NOTE — Progress Notes (Signed)
  Echocardiogram 2D Echocardiogram has been performed.  Antwyne Pingree [facility] 08/15/2024, 9:06 AM

## 2024-08-15 NOTE — TOC Transition Note (Signed)
 Transition of Care [facility]) - Discharge Note   Patient Details  Name: Jonathan Miller. MRN: [id number] Date of Birth: [birth year]  Transition of Care [facility]) CM/SW Contact:  Andrez JULIANNA George, RN Phone Number: 08/15/2024, 12:17 PM   Clinical Narrative:     Pt is discharging home with self care.  Pt is from home with spouse. They are together most of the time.  No follow  up per therapies. No DME at home.  Pt drives self as needed but wife can provide transportation and will transport him home today.   Final next level of care: Home/Self Care Barriers to Discharge: No Barriers Identified   Patient Goals and CMS Choice            Discharge Placement                       Discharge Plan and Services Additional resources added to the After Visit Summary for                                       Social Drivers of Health (SDOH) Interventions SDOH Screenings   Food Insecurity: No Food Insecurity (08/10/2023)   Received from [facility]  Housing: Low Risk  (08/10/2023)   Received from [facility]  Transportation Needs: No Transportation Needs (08/10/2023)   Received from [facility]  Utilities: Not At Risk (08/10/2023)   Received from [facility]  Depression (PHQ2-9): Low Risk  (06/10/2020)  Financial Resource Strain: Low Risk  (08/10/2023)   Received from [facility]  Physical Activity: Sufficiently Active (08/10/2023)   Received from [facility]  Social Connections: Moderately Integrated (08/10/2023)   Received from [facility]  Stress: No Stress Concern Present (08/10/2023)   Received from [facility]  Tobacco Use: High Risk (08/14/2024)     Readmission Risk Interventions     No data to display

## 2024-08-15 NOTE — Progress Notes (Signed)
 Speech Language Pathology  Patient Details Name: Jonathan Miller. MRN: [id number] DOB: [birth year] Today's Date: 08/15/2024 Time:  -     Pt screened. No deficits stated prior to admission or currently with speech-language-cognition. States he is back to his baseline.     Dustin Olam Bull  08/15/2024, 11:09 AM

## 2024-08-15 NOTE — Progress Notes (Signed)
 Discharge instructions given to Jonathan Miller and his wife. Spanish interpreter used in order to include wife in teaching. Both stated understanding. Patient wanted to walk off unit and refused a wheelchair. IV removed and pt walked off unit.

## 2024-08-15 NOTE — Progress Notes (Addendum)
 STROKE TEAM PROGRESS NOTE    SIGNIFICANT HOSPITAL EVENTS  11/25: dizziness, imbalance, diplopia. NIH: 1. MRI: punctate R pontine infarct. Remote R cerebellar infarcts  INTERIM HISTORY/SUBJECTIVE  On exam, he continues to have right INO. He wants to go home, is agitated and restless to get everything done.   OBJECTIVE  CBC    Component Value Date/Time   WBC 8.8 08/15/2024 0128   RBC 5.08 08/15/2024 0128   HGB 16.1 08/15/2024 0128   HCT 49.1 08/15/2024 0128   PLT 278 08/15/2024 0128   MCV 96.7 08/15/2024 0128   MCH 31.7 08/15/2024 0128   MCHC 32.8 08/15/2024 0128   RDW 12.5 08/15/2024 0128   LYMPHSABS 0.9 08/14/2024 1222   MONOABS 0.6 08/14/2024 1222   EOSABS 0.2 08/14/2024 1222   BASOSABS 0.1 08/14/2024 1222    BMET    Component Value Date/Time   NA 138 08/15/2024 0128   NA 140 02/10/2018 1450   K 4.6 08/15/2024 0128   CL 100 08/15/2024 0128   CO2 27 08/15/2024 0128   GLUCOSE 257 (H) 08/15/2024 0128   BUN 41 (H) 08/15/2024 0128   BUN 20 02/10/2018 1450   CREATININE 1.81 (H) 08/15/2024 0128   CREATININE 0.93 12/06/2016 0838   CALCIUM  8.8 (L) 08/15/2024 0128   GFRNONAA 40 (L) 08/15/2024 0128   GFRNONAA 88 12/06/2016 0838    IMAGING past 24 hours CT ANGIO HEAD NECK W WO CM Result Date: 08/15/2024 CLINICAL DATA:  Initial evaluation for acute stroke. EXAM: CT ANGIOGRAPHY HEAD AND NECK WITH AND WITHOUT CONTRAST TECHNIQUE: Multidetector CT imaging of the head and neck was performed using the standard protocol during bolus administration of intravenous contrast. Multiplanar CT image reconstructions and MIPs were obtained to evaluate the vascular anatomy. Carotid stenosis measurements (when applicable) are obtained utilizing NASCET criteria, using the distal internal carotid diameter as the denominator. RADIATION DOSE REDUCTION: This exam was performed according to the departmental dose-optimization program which includes automated exposure control, adjustment of the mA  and/or kV according to patient size and/or use of iterative reconstruction technique. CONTRAST:  75mL OMNIPAQUE  IOHEXOL  350 MG/ML SOLN COMPARISON:  Comparison made with CT and MRI from earlier the same day. FINDINGS: CTA NECK FINDINGS Aortic arch: Visualized aortic arch within normal limits for caliber with standard 3 vessel morphology. Extensive aortic atherosclerosis. No significant stenosis about the origin the great vessels. Right carotid system: Right common and internal carotid arteries are patent without dissection. Moderate atheromatous change about the right carotid artery system throughout the neck. No hemodynamically significant greater than 50% stenosis. Left carotid system: Left common and internal carotid arteries are patent without dissection. Extensive atheromatous change about the left carotid artery system within the neck. Resultant short-segment 60-70% stenosis about the proximal cervical left ICA (series 5, image 186). Vertebral arteries: Both vertebral arteries arise from the subclavian arteries. Atheromatous change about the proximal left subclavian artery with resultant severe near occlusive stenosis (series 5, image 281). Additional severe stenosis at the origin of the left vertebral artery (series 5, image 272). Minimal if any flow is seen within the proximal left vertebral artery through the mid left V2 segment. Increasing opacification of the distal left V2/V3 segments suspected to be retrograde in nature. Patient is potentially at risk for left-sided subclavian steal. The contralateral right vertebral artery is patent without significant stenosis or dissection. Skeleton: No worrisome osseous lesions. Moderate spondylosis at C4-5 through C6-7. Other neck: No other acute finding. Upper chest: Emphysema.  No other acute finding.  Review of the MIP images confirms the above findings CTA HEAD FINDINGS Anterior circulation: Atheromatous change about the carotid siphons with associated mild to  moderate stenoses bilaterally. A1 segments patent bilaterally. Left A1 hypoplastic. Normal anterior communicating artery complex. Anterior cerebral arteries patent without significant stenosis. No M1 stenosis or occlusion. No proximal MCA branch occlusion or high-grade stenosis. Distal MCA branches perfused and symmetric. Posterior circulation: Right V4 segment patent without stenosis. Atheromatous plaque about the mid left V4 segment without hemodynamically significant stenosis. Flow within the left V4 segment suspected to be retrograde in nature. Both PICA patent at their origins. Basilar patent without stenosis. Superior cerebral arteries patent bilaterally. Right PCA supplied via the basilar. Left PCA supplied via a hypoplastic left P1 segment and prominent left posterior communicating artery. Both PCAs patent to their distal aspects without significant stenosis. Venous sinuses: Patent allowing for timing the contrast bolus. Anatomic variants: As above.  No aneurysm. Review of the MIP images confirms the above findings IMPRESSION: 1. Negative CTA for acute large vessel occlusion or other emergent finding. 2. 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 Minimal downstream flow within the proximal left vertebral artery through the mid left V2 segment. Increasing opacification of the distal left V2/V3 segments suspected to be retrograde in nature. Patient is potentially at risk for left-sided subclavian steal syndrome. 3. Short-segment 60-70% stenosis about the proximal cervical left ICA. 4. Atheromatous change about the carotid siphons with associated mild to moderate stenoses bilaterally. Aortic Atherosclerosis (ICD10-I70.0) and Emphysema (ICD10-J43.9). Electronically Signed   By: Morene Hoard M.D.   On: 08/15/2024 02:07   MR BRAIN WO CONTRAST Result Date: 08/15/2024 CLINICAL DATA:  Initial  evaluation for acute neuro deficit, stroke suspected, diplopia. EXAM: MRI HEAD WITHOUT CONTRAST TECHNIQUE: Multiplanar, multiecho pulse sequences of the brain and surrounding structures were obtained without intravenous contrast. COMPARISON:  Comparison made with prior CT from earlier the same day. FINDINGS: Brain: Examination degraded by motion artifact. Cerebral volume within normal limits for age. Patchy T2/FLAIR hyperintensity involving the periventricular and deep white matter both cerebral hemispheres, consistent with chronic small vessel ischemic disease, mild for age. Small remote right cerebellar infarct noted. Chronic lacunar infarct at the left thalamus. Punctate 3 mm focus of restricted diffusion seen involving the right para median pons, involving the periaqueductal gray matter (series 3, image 17), consistent with a small acute ischemic infarct. No associated hemorrhage. No other evidence for acute or subacute ischemia. Gray-white matter differentiation otherwise maintained. No visible acute or chronic intracranial blood products. No mass lesion, midline shift or mass effect. No hydrocephalus or extra-axial fluid collection. Pituitary gland within normal limits. Vascular: Major intracranial vascular flow voids are maintained. Skull and upper cervical spine: Craniocervical junction within normal limits. Bone marrow signal intensity normal. No scalp soft tissue abnormality. Sinuses/Orbits: Globes and orbital soft tissues within normal limits. Mild scattered mucosal thickening noted about the ethmoidal air cells and maxillary sinuses. No significant mastoid effusion. Other: None. IMPRESSION: 1. Punctate 3 mm acute ischemic nonhemorrhagic right pontine infarct. 2. Underlying mild chronic microvascular ischemic disease with small remote right cerebellar and left thalamic infarcts. Electronically Signed   By: Morene Hoard M.D.   On: 08/15/2024 01:43   DG Chest 2 View Result Date:  08/14/2024 CLINICAL DATA:  low spo2 EXAM: CHEST - 2 VIEW COMPARISON:  08/31/2020. FINDINGS: There is mild diffuse pulmonary vascular congestion. Bilateral lung fields are clear.  There is minimal blunting of bilateral posterior costophrenic angles, suggesting trace pleural effusion. Normal cardio-mediastinal silhouette. No acute osseous abnormalities. The soft tissues are within normal limits. IMPRESSION: Mild diffuse pulmonary vascular congestion and trace bilateral pleural effusions. Electronically Signed   By: Ree Molt M.D.   On: 08/14/2024 13:51   CT Head Wo Contrast Result Date: 08/14/2024 EXAM: CT HEAD WITHOUT CONTRAST 08/14/2024 01:20:00 PM TECHNIQUE: CT of the head was performed without the administration of intravenous contrast. Automated exposure control, iterative reconstruction, and/or weight based adjustment of the mA/kV was utilized to reduce the radiation dose to as low as reasonably achievable. COMPARISON: CT Head Feb 08, 2016 CLINICAL HISTORY: Syncope/presyncope, cerebrovascular cause suspected FINDINGS: BRAIN AND VENTRICLES: No acute hemorrhage. No evidence of acute infarct. Remote right cerebellar infarct. No hydrocephalus. No extra-axial collection. No mass effect or midline shift. ORBITS: No acute abnormality. SINUSES: No acute abnormality. SOFT TISSUES AND SKULL: No acute soft tissue abnormality. No skull fracture. IMPRESSION: 1. No acute intracranial abnormality. 2. Remote right cerebellar infarct. Electronically signed by: Gilmore Molt MD 08/14/2024 01:35 PM EST RP Workstation: [id number]    Vitals:   08/15/24 0045 08/15/24 0327 08/15/24 0500 08/15/24 0811  BP:  123/86  133/88  Pulse:  80  80  Resp:    20  Temp:  97.8 F (36.6 C)  97.9 F (36.6 C)  TempSrc:  Axillary  Oral  SpO2: 90% 95%  94%  Weight:   88.1 kg    PHYSICAL EXAM General:  Alert, well-nourished, well-developed patient in no acute distress CV: Regular rate and rhythm on monitor Respiratory:   Regular, unlabored respirations on room air  NEURO:  Mental Status: AA&Ox3, patient is able to give clear and coherent history Speech/Language: speech is without dysarthria or aphasia.  Naming, repetition, fluency, and comprehension intact.  Cranial Nerves:  II: PERRL. Visual fields full.  III, IV, VI: EMOI now V: Sensation is intact to light touch and symmetrical to face.  VII: Face is symmetrical resting and smiling VIII: hearing intact to voice. IX, X: Palate elevates symmetrically. Phonation is normal.  KP:Dynloizm shrug 5/5. XII: tongue is midline without fasciculations. Motor: 5/5 strength to all muscle groups tested. No drift.  Tone: is normal and bulk is normal Sensation- Intact to light touch bilaterally. Extinction absent to light touch to DSS.   Coordination: FTN intact bilaterally, HKS: no ataxia in BLE.  Gait- deferred  Most Recent NIH: 1   ASSESSMENT/PLAN  Jonathan Miller. is a 68 y.o. male with multiple cerebrovascular risk factors presenting for sudden onset of diplopia.  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  NIH on Admission 1.  Stroke:  right dorsal pontine small infarct, etiology: small vessel disease CT head No acute intracranial abnormality. Remote right cerebellar infarct. CTA head & neck Severe near occlusive stenosis about the proximal left subclavian artery, with an additional severe stenosis at the origin of the left vertebral artery. Minimal downstream flow within the proximal left vertebral artery through the mid left V2 segment. Increasing opacification of the distal left V2/V3 segments suspected to be retrograde in nature. Patient is potentially at risk for left-sided subclavian steal syndrome. Short-segment 60-70% stenosis about the proximal cervical left ICA. Atheromatous change about the carotid siphons with associated mild to moderate stenoses bilaterally. MRI  Punctate 3 mm acute  ischemic nonhemorrhagic right pontine infarct. Small remote right cerebellar and left thalamic  infarcts 2D Echo: EF 50 to 55%  LDL 131 HgbA1c Pending  VTE prophylaxis - Heparin  SQ aspirin  81 mg daily and clopidogrel  75 mg daily prior to admission, continue home aspirin  81 mg daily and clopidogrel  75 mg daily. 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 Minimal downstream flow within the proximal left vertebral artery through the mid left V2 segment. Increasing opacification of the distal left V2/V3 segments suspected to be retrograde in nature. Patient is potentially at risk for left-sided subclavian steal syndrome.  Asymptomatic, pt denies any LUE claudication or dizziness with LUE strenuous activities Outpt follow up with Dr. Lester neuro IR.   Hypertension Home meds:  Lisinopril  5mg , Lopressor  25mg  Stable Long term BP goal: normalize   Hyperlipidemia Home meds:  none (Lipitor  80mg  was noted, but patient states he does not take) LDL 131, goal < 70 restart Lipitor  80mg    Continue statin at discharge  Diabetes type II  Home meds:  metformin  750mg  (not taking) HgbA1c Pending,  goal < 7.0 CBGs SSI Recommend close follow-up with PCP for better DM control  Tobacco Abuse Patient smokes 0.5 packs per day       Ready to quit? No Smoking cessation education provided  Other Stroke Risk Factors Obesity, Body mass index is 32.32 kg/m., BMI >/= 30 associated with increased stroke risk, recommend weight loss, diet and exercise as appropriate  Coronary artery disease PAD  Other medical issues CKD 3b Cre 1.56--1.81  Hospital day # 0  Patient is OK for discharge from neurology standpoint, with recommendations as above. Follow-up with outpatient neurology in 8 weeks. Plan  discussed with Attending, Dr. Cindy.    Pt seen by Neuro NP/APP and later by MD. Note/plan to be edited by MD as needed.    Rocky JAYSON Likes, DNP [facility] Please use AMION for contact information & EPIC for messaging.  ATTENDING NOTE: I reviewed above note and agree with the assessment and plan. Pt was seen and examined.   Wife at the bedside, pt standing in room asking to be discharged NOW. He stated that his symptoms have resolved. He takes ASA, plavix  and lisinopril  at home only. Not other meds. He did not take lipitor  80 with some stomach hurting but willing to restarted it due to current stroke. Will continue home DAPT. PT and OT no recs.  For detailed assessment and plan, please refer to above as I have made changes wherever appropriate.   Ary Cummins, MD PhD Stroke Neurology 08/15/2024 1:51 PM  Neurology will sign off. Please call with questions. Pt will follow up with stroke clinic NP at [facility] in about 4 weeks. Thanks for the consult.   To contact Stroke Continuity provider, please refer to [email or web address]. After hours, contact General Neurology

## 2024-08-15 NOTE — Progress Notes (Signed)
 PT Cancellation Note  Patient Details Name: Jonathan Miller. MRN: [id number] DOB: 1956/01/10   Cancelled Treatment:    Reason Eval/Treat Not Completed: Per OT, pt is at baseline with no acute or post-acute PT needs. Will sign off. Please re-consult if there is a change in status.  [name], PT, DPT Secure Chat Preferred  Rehab Office [phone]    [name] Wendolyn 08/15/2024, 10:01 AM

## 2024-08-15 NOTE — Discharge Summary (Signed)
 Physician Discharge Summary   Patient: Jonathan Miller. MRN: [id number] DOB: 1956/06/24  Admit date:     08/14/2024  Discharge date: 08/15/24  Discharge Physician: Garnette Pelt   PCP: Delbert Clam, MD   Recommendations at discharge:    Follow up with PCP In 1-2 weeks Follow up with Neurology as scheduled  Discharge Diagnoses: Principal Problem:   Stroke-like symptom Active Problems:   Acute on chronic heart failure with preserved ejection fraction (HFpEF, >= 50%) (HCC)   AKI (acute kidney injury)   Insulin  dependent type 2 diabetes mellitus (HCC)   Hypertension associated with diabetes (HCC)   Hyperlipidemia associated with type 2 diabetes mellitus (HCC)   Tobacco use   Hyperkalemia  Resolved Problems:   * No resolved hospital problems. *  Hospital Course:  68 y.o. male with medical history significant for CAD s/p DES mRCA 2021, PAD, T2DM, HTN, HLD, tobacco use who presented to the ED for evaluation of double vision.   Patient states he was in his usual state of health when he went to bed last night.  He woke up at 3 AM this morning to use the bathroom and did not have any new symptoms but when he woke up again at 6:45 AM he noticed new double vision.  He says the double vision only occurred when both eyes were open.  If he covered either his left or right eye the double vision went away.  He has not had any chest pain or shortness of breath.  He reports recent cough and chest congestion.   He denies any associated headache, nausea, vomiting, slurred speech, focal weakness or sensory change.  He denies any similar episodes in the past.  He denies any recent medication changes.   [facility] ED Course  Labs/Imaging on admission: I have personally reviewed following labs and imaging studies.   Initial vitals showed BP 165/70, pulse 80, RR 17, temp 97.8 F, SpO2 93% on room air.   Labs showed WBC 8.8, hemoglobin 16.5, platelets 276, sodium 134, potassium 5.3, bicarb  28, BUN 42, creatinine 1.56, serum glucose 316.   CT head without contrast negative for acute intracranial abnormality.  Remote right cerebellar infarct noted.   2 view chest x-ray showed mild diffuse pulmonary vascular congestion and trace bilateral pleural effusions.   Patient was given 1 L normal saline bolus and albuterol  inhaler.  EDP spoke with neurology who recommended medical admission to [facility] for stroke workup.  The hospitalist service was consulted to admit.  Pt was noted to have acute ischemic stroke on imaging  Assessment and Plan: R dorsal pontine infarct, stroke: Patient presenting for evaluation of new onset diplopia.  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  Neurology consulted and patient admitted for further stroke workup. - CT head negative for acute findings, remote right cerebellar infarction seen - MRI brain confirmed a R sided dorsal pontine infarct - CTA head/neck notable for severe stenosis involving L subclavian. Recs to refer pt to Neurovascular specialist - Echocardiogram reviewed. Unchanged from prior - Continued on aspirin /Plavix  per neurology - Continue atorvastatin    Acute on chronic HFpEF ruled out  Patient appears volume overloaded on admission with peripheral edema, pulmonary vascular congestion on CXR.  Last TTE 10/24/2020 showed EF improved to 50-55% compared to previous 45-50%. -repeat echo with EF 50-55% with regional wall motion, which is similar to prior echo in 2022 - BNP normal - Pt was given one dose of lasix  on admit  Acute kidney injury: Presumed AKI with creatinine 1.56 on admission compared to previous baseline 1.1-1.3 from labs in 2021.    Hyperkalemia: corrected   CAD s/p DES mRCA 2021: Stable, denies chest pain.  Continue aspirin , Plavix , atorvastatin .   Type 2 diabetes: Placed on SSI. Cont home meds on d/c -Pt follows Endocrinology as outpt   Hypertension: Allowing permissive hypertension for  now.   Hyperlipidemia: Continue atorvastatin .   Tobacco use: Patient reports smoking 1 pack every 1-2 days.    Consultants: Neurology Procedures performed:   Disposition: Home Diet recommendation:  Cardiac and Carb modified diet DISCHARGE MEDICATION: Allergies as of 08/15/2024   No Known Allergies      Medication List     STOP taking these medications    metoprolol  tartrate 25 MG tablet Commonly known as: LOPRESSOR        TAKE these medications    aspirin  EC 81 MG tablet Take 1 tablet (81 mg total) by mouth daily. Swallow whole.   atorvastatin  80 MG tablet Commonly known as: LIPITOR  TAKE 1 TABLET(80 MG) BY MOUTH DAILY   clopidogrel  75 MG tablet Commonly known as: PLAVIX  TAKE 1 TABLET(75 MG) BY MOUTH DAILY   lisinopril  5 MG tablet Commonly known as: ZESTRIL  TAKE 1 TABLET(5 MG) BY MOUTH DAILY   metFORMIN  750 MG 24 hr tablet Commonly known as: GLUCOPHAGE -XR Take 750 mg by mouth daily.   nitroGLYCERIN  0.4 MG SL tablet Commonly known as: NITROSTAT  Place 1 tablet (0.4 mg total) under the tongue every 5 (five) minutes as needed for chest pain (up to 3 doses max).   NovoLIN  70/30 (70-30) 100 UNIT/ML injection Generic drug: insulin  NPH-regular Human Inject 10 Units into the skin 2 (two) times daily with a meal.   Pen Needles 32G X 4 MM Misc 1 each by Does not apply route 2 (two) times daily.        Follow-up Information     [facility]. Schedule an appointment as soon as possible for a visit in 1 month(s).   Specialty: Neurology Why: stroke clinic Contact information: [address] [place]  [address] [phone]        Delbert Clam, MD Follow up in 2 week(s).   Specialty: Family Medicine Why: Hospital follow up Contact information: [address] [place] [address] [place] [address] [phone]                Discharge Exam: Fredricka Weights   08/15/24 0500  Weight: 88.1  kg   General exam: Awake, laying in bed, in nad Respiratory system: Normal respiratory effort, no wheezing Cardiovascular system: regular rate, s1, s2 Gastrointestinal system: Soft, nondistended, positive BS Central nervous system: CN2-12 grossly intact, strength intact Extremities: Perfused, no clubbing Skin: Normal skin turgor, no notable skin lesions seen Psychiatry: Mood normal // no visual hallucinations   Condition at discharge: fair  The results of significant diagnostics from this hospitalization (including imaging, microbiology, ancillary and laboratory) are listed below for reference.   Imaging Studies: CT ANGIO HEAD NECK W WO CM Result Date: 08/15/2024 CLINICAL DATA:  Initial evaluation for acute stroke. EXAM: CT ANGIOGRAPHY HEAD AND NECK WITH AND WITHOUT CONTRAST TECHNIQUE: Multidetector CT imaging of the head and neck was performed using the standard protocol during bolus administration of intravenous contrast. Multiplanar CT image reconstructions and MIPs were obtained to evaluate the vascular anatomy. Carotid stenosis measurements (when applicable) are obtained utilizing NASCET criteria, using the distal internal carotid diameter as the denominator.  RADIATION DOSE REDUCTION: This exam was performed according to the departmental dose-optimization program which includes automated exposure control, adjustment of the mA and/or kV according to patient size and/or use of iterative reconstruction technique. CONTRAST:  75mL OMNIPAQUE  IOHEXOL  350 MG/ML SOLN COMPARISON:  Comparison made with CT and MRI from earlier the same day. FINDINGS: CTA NECK FINDINGS Aortic arch: Visualized aortic arch within normal limits for caliber with standard 3 vessel morphology. Extensive aortic atherosclerosis. No significant stenosis about the origin the great vessels. Right carotid system: Right common and internal carotid arteries are patent without dissection. Moderate atheromatous change about the right  carotid artery system throughout the neck. No hemodynamically significant greater than 50% stenosis. Left carotid system: Left common and internal carotid arteries are patent without dissection. Extensive atheromatous change about the left carotid artery system within the neck. Resultant short-segment 60-70% stenosis about the proximal cervical left ICA (series 5, image 186). Vertebral arteries: Both vertebral arteries arise from the subclavian arteries. Atheromatous change about the proximal left subclavian artery with resultant severe near occlusive stenosis (series 5, image 281). Additional severe stenosis at the origin of the left vertebral artery (series 5, image 272). Minimal if any flow is seen within the proximal left vertebral artery through the mid left V2 segment. Increasing opacification of the distal left V2/V3 segments suspected to be retrograde in nature. Patient is potentially at risk for left-sided subclavian steal. The contralateral right vertebral artery is patent without significant stenosis or dissection. Skeleton: No worrisome osseous lesions. Moderate spondylosis at C4-5 through C6-7. Other neck: No other acute finding. Upper chest: Emphysema.  No other acute finding. Review of the MIP images confirms the above findings CTA HEAD FINDINGS Anterior circulation: Atheromatous change about the carotid siphons with associated mild to moderate stenoses bilaterally. A1 segments patent bilaterally. Left A1 hypoplastic. Normal anterior communicating artery complex. Anterior cerebral arteries patent without significant stenosis. No M1 stenosis or occlusion. No proximal MCA branch occlusion or high-grade stenosis. Distal MCA branches perfused and symmetric. Posterior circulation: Right V4 segment patent without stenosis. Atheromatous plaque about the mid left V4 segment without hemodynamically significant stenosis. Flow within the left V4 segment suspected to be retrograde in nature. Both PICA patent at  their origins. Basilar patent without stenosis. Superior cerebral arteries patent bilaterally. Right PCA supplied via the basilar. Left PCA supplied via a hypoplastic left P1 segment and prominent left posterior communicating artery. Both PCAs patent to their distal aspects without significant stenosis. Venous sinuses: Patent allowing for timing the contrast bolus. Anatomic variants: As above.  No aneurysm. Review of the MIP images confirms the above findings IMPRESSION: 1. Negative CTA for acute large vessel occlusion or other emergent finding. 2. 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 Minimal downstream flow within the proximal left vertebral artery through the mid left V2 segment. Increasing opacification of the distal left V2/V3 segments suspected to be retrograde in nature. Patient is potentially at risk for left-sided subclavian steal syndrome. 3. Short-segment 60-70% stenosis about the proximal cervical left ICA. 4. Atheromatous change about the carotid siphons with associated mild to moderate stenoses bilaterally. Aortic Atherosclerosis (ICD10-I70.0) and Emphysema (ICD10-J43.9). Electronically Signed   By: Morene Hoard M.D.   On: 08/15/2024 02:07   MR BRAIN WO CONTRAST Result Date: 08/15/2024 CLINICAL DATA:  Initial evaluation for acute neuro deficit, stroke suspected, diplopia. EXAM: MRI HEAD WITHOUT CONTRAST TECHNIQUE: Multiplanar, multiecho pulse sequences of  the brain and surrounding structures were obtained without intravenous contrast. COMPARISON:  Comparison made with prior CT from earlier the same day. FINDINGS: Brain: Examination degraded by motion artifact. Cerebral volume within normal limits for age. Patchy T2/FLAIR hyperintensity involving the periventricular and deep white matter both cerebral hemispheres, consistent with chronic small vessel ischemic disease, mild for  age. Small remote right cerebellar infarct noted. Chronic lacunar infarct at the left thalamus. Punctate 3 mm focus of restricted diffusion seen involving the right para median pons, involving the periaqueductal gray matter (series 3, image 17), consistent with a small acute ischemic infarct. No associated hemorrhage. No other evidence for acute or subacute ischemia. Gray-white matter differentiation otherwise maintained. No visible acute or chronic intracranial blood products. No mass lesion, midline shift or mass effect. No hydrocephalus or extra-axial fluid collection. Pituitary gland within normal limits. Vascular: Major intracranial vascular flow voids are maintained. Skull and upper cervical spine: Craniocervical junction within normal limits. Bone marrow signal intensity normal. No scalp soft tissue abnormality. Sinuses/Orbits: Globes and orbital soft tissues within normal limits. Mild scattered mucosal thickening noted about the ethmoidal air cells and maxillary sinuses. No significant mastoid effusion. Other: None. IMPRESSION: 1. Punctate 3 mm acute ischemic nonhemorrhagic right pontine infarct. 2. Underlying mild chronic microvascular ischemic disease with small remote right cerebellar and left thalamic infarcts. Electronically Signed   By: Morene Hoard M.D.   On: 08/15/2024 01:43   DG Chest 2 View Result Date: 08/14/2024 CLINICAL DATA:  low spo2 EXAM: CHEST - 2 VIEW COMPARISON:  08/31/2020. FINDINGS: There is mild diffuse pulmonary vascular congestion. Bilateral lung fields are clear. There is minimal blunting of bilateral posterior costophrenic angles, suggesting trace pleural effusion. Normal cardio-mediastinal silhouette. No acute osseous abnormalities. The soft tissues are within normal limits. IMPRESSION: Mild diffuse pulmonary vascular congestion and trace bilateral pleural effusions. Electronically Signed   By: Ree Molt M.D.   On: 08/14/2024 13:51   CT Head Wo Contrast Result  Date: 08/14/2024 EXAM: CT HEAD WITHOUT CONTRAST 08/14/2024 01:20:00 PM TECHNIQUE: CT of the head was performed without the administration of intravenous contrast. Automated exposure control, iterative reconstruction, and/or weight based adjustment of the mA/kV was utilized to reduce the radiation dose to as low as reasonably achievable. COMPARISON: CT Head Feb 08, 2016 CLINICAL HISTORY: Syncope/presyncope, cerebrovascular cause suspected FINDINGS: BRAIN AND VENTRICLES: No acute hemorrhage. No evidence of acute infarct. Remote right cerebellar infarct. No hydrocephalus. No extra-axial collection. No mass effect or midline shift. ORBITS: No acute abnormality. SINUSES: No acute abnormality. SOFT TISSUES AND SKULL: No acute soft tissue abnormality. No skull fracture. IMPRESSION: 1. No acute intracranial abnormality. 2. Remote right cerebellar infarct. Electronically signed by: Gilmore Molt MD 08/14/2024 01:35 PM EST RP Workstation: [id number]    Microbiology: Results for orders placed or performed during the hospital encounter of 05/06/20  SARS Coronavirus 2 by RT PCR (hospital order, performed in [facility] hospital lab) Nasopharyngeal Nasopharyngeal Swab     Status: None   Collection Time: 05/06/20  1:53 AM   Specimen: Nasopharyngeal Swab  Result Value Ref Range Status   SARS Coronavirus 2 NEGATIVE NEGATIVE Final    Comment: (NOTE) SARS-CoV-2 target nucleic acids are NOT DETECTED.  The SARS-CoV-2 RNA is generally detectable in upper and lower respiratory specimens during the acute phase of infection. The lowest concentration of SARS-CoV-2 viral copies this assay can detect is 250 copies / mL. A negative result does not preclude SARS-CoV-2 infection and should not be used as the sole basis  for treatment or other patient management decisions.  A negative result may occur with improper specimen collection / handling, submission of specimen other than nasopharyngeal swab, presence of viral  mutation(s) within the areas targeted by this assay, and inadequate number of viral copies (<250 copies / mL). A negative result must be combined with clinical observations, patient history, and epidemiological information.  Fact Sheet for Patients:   boilerbrush.com.cy  Fact Sheet for Healthcare Providers: https://pope.com/  This test is not yet approved or  cleared by the [place]  FDA and has been authorized for detection and/or diagnosis of SARS-CoV-2 by FDA under an Emergency Use Authorization (EUA).  This EUA will remain in effect (meaning this test can be used) for the duration of the COVID-19 declaration under Section 564(b)(1) of the Act, 21 U.S.C. section 360bbb-3(b)(1), unless the authorization is terminated or revoked sooner.  Performed at [facility], [address], [place], [place] [address]   MRSA PCR Screening     Status: None   Collection Time: 05/06/20  4:28 AM   Specimen: Nasopharyngeal  Result Value Ref Range Status   MRSA by PCR NEGATIVE NEGATIVE Final    Comment:        The GeneXpert MRSA Assay (FDA approved for NASAL specimens only), is one component of a comprehensive MRSA colonization surveillance program. It is not intended to diagnose MRSA infection nor to guide or monitor treatment for MRSA infections. Performed at [facility], [address], [place], [place] [address]     Labs: CBC: Recent Labs  Lab 08/14/24 1222 08/14/24 1237 08/15/24 0128  WBC 8.8  --  8.8  NEUTROABS 7.0  --   --   HGB 16.5 16.3 16.1  HCT 50.9 48.0 49.1  MCV 97.3  --  96.7  PLT 276  --  278   Basic Metabolic Panel: Recent Labs  Lab 08/14/24 1222 08/14/24 1237 08/15/24 0128  NA 134* 132* 138  K 5.3* 4.9 4.6  CL 98  --  100  CO2 28  --  27  GLUCOSE 316*  --  257*  BUN 42*  --  41*  CREATININE 1.56*  --  1.81*  CALCIUM  9.8  --  8.8*   Liver Function Tests: Recent Labs  Lab  08/15/24 0128  AST 18  ALT 17  ALKPHOS 53  BILITOT 0.4  PROT 5.4*  ALBUMIN 2.4*   CBG: Recent Labs  Lab 08/14/24 1359 08/14/24 2141 08/15/24 0547  GLUCAP 244* 361* 176*    Discharge time spent: less than 30 minutes.  Signed: Garnette Pelt, MD [facility] 08/15/2024

## 2024-08-20 ENCOUNTER — Telehealth: Payer: Self-pay

## 2024-08-20 NOTE — Transitions of Care (Post Inpatient/ED Visit) (Signed)
   08/20/2024  Name: Jonathan Miller. MRN: [id number] DOB: 02-28-1956  Today's TOC FU Call Status: Today's TOC FU Call Status:: Unsuccessful Call (1st Attempt) Unsuccessful Call (1st Attempt) Date: 08/20/24  Attempted to reach the patient regarding the most recent Inpatient/ED visit.  Follow Up Plan: Additional outreach attempts will be made to reach the patient to complete the Transitions of Care (Post Inpatient/ED visit) call.   Dr Newlin is listed as his PCP but he has not seen her in over 4 years. Will need to determine if he has a new PCP  Signature  Slater Diesel, RN

## 2024-08-21 ENCOUNTER — Telehealth: Payer: Self-pay

## 2024-08-21 NOTE — Transitions of Care (Post Inpatient/ED Visit) (Signed)
   08/21/2024  Name: Jonathan Miller. MRN: [id number] DOB: Jul 23, 1956  Today's TOC FU Call Status: Today's TOC FU Call Status:: Unsuccessful Call (2nd Attempt) Unsuccessful Call (1st Attempt) Date: 08/20/24 Unsuccessful Call (2nd Attempt) Date: 08/21/24  Attempted to reach the patient regarding the most recent Inpatient/ED visit.  Follow Up Plan: Additional outreach attempts will be made to reach the patient to complete the Transitions of Care (Post Inpatient/ED visit) call.   Signature  Slater Diesel, RN

## 2024-08-22 ENCOUNTER — Telehealth: Payer: Self-pay

## 2024-08-22 NOTE — Transitions of Care (Post Inpatient/ED Visit) (Signed)
   08/22/2024  Name: Jonathan Miller. MRN: [id number] DOB: 27-Jul-1956  Today's TOC FU Call Status: Today's TOC FU Call Status:: Unsuccessful Call (3rd Attempt) Unsuccessful Call (1st Attempt) Date: 08/20/24 Unsuccessful Call (2nd Attempt) Date: 08/21/24 Unsuccessful Call (3rd Attempt) Date: 08/22/24  Attempted to reach the patient regarding the most recent Inpatient/ED visit.  Follow Up Plan: No further outreach attempts will be made at this time. We have been unable to contact the patient.  Dr Newlin is listed as his PCP but he has not seen her in over 4 years. Will need to determine if he has a new PCP   Letter sent to patient requesting he call the office to schedule an appointment if he would still like to be seen at [facility] Slater Diesel, RN

## 2024-10-03 ENCOUNTER — Encounter: Payer: Self-pay | Admitting: Neuroradiology

## 2024-10-03 ENCOUNTER — Ambulatory Visit: Admitting: Neuroradiology

## 2024-10-03 VITALS — BP 108/79 | HR 96 | Temp 98.3°F | Ht 65.0 in | Wt 194.0 lb

## 2024-10-03 DIAGNOSIS — I6522 Occlusion and stenosis of left carotid artery: Secondary | ICD-10-CM

## 2024-10-03 DIAGNOSIS — Z720 Tobacco use: Secondary | ICD-10-CM

## 2024-10-03 DIAGNOSIS — I771 Stricture of artery: Secondary | ICD-10-CM

## 2024-10-03 DIAGNOSIS — I6381 Other cerebral infarction due to occlusion or stenosis of small artery: Secondary | ICD-10-CM

## 2024-10-03 NOTE — Progress Notes (Signed)
 "    Chief Complaint: Patient was seen in consultation today for  Chief Complaint  Patient presents with   New Patient (Initial Visit)    Pt states that he has no issues   at the request of Chiu,Stephen K  Referring Physician(s): Chiu,Stephen K  [name] is a 69 y.o. male seen for evaluation of carotid artery disease  Assessment and Plan Assessment & Plan Left carotid artery stenosis CTA indicates 60-70% stenosis, likely less severe.  Clinically asymptomatic - Ordered ultrasound of the neck to assess carotid artery stenosis. - Continue aspirin  81 mg daily. 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  Pontine lacunar infarct.  Medical treatment of vascular risk factors.  Stop the clopidogrel  as noted above.     Discussed the use of AI scribe software for clinical note transcription with the patient, who gave verbal consent to proceed.  History of Present Illness Jonathan Miller. is a 69 year old male with a history of stroke who presents for follow-up of cerebrovascular imaging findings. He is accompanied by his wife.  He experienced an episode of diplopia upon waking, which led to an emergency department visit where he was diagnosed with a pontine lacunar stroke. The diplopia resolved by the next morning, and he has not had any further episodes. No weakness, facial drooping, or dysarthria. He continues to work part-time at [employer] in [employer], working four hours a day.  He underwent an MRI and CTA on August 14, 2024. The MRI showed a punctate right pontine peri-aqueductal lacunar stroke. The CTA revealed a left internal carotid artery stenosis, initially interpreted as 60-70%, and a heavily calcified subclavian artery stenosis with subclavian steal.  He is currently taking aspirin  81 mg daily, atorvastatin , metformin , and lisinopril .  Clopidogrel  was  prescribed after the lacunar infarct.  He smokes about one and a half packs per day.  No chest pain, shortness of breath, or dizziness. He reports occasional paresthesia in both arms and feet, especially after ambulation, but no episodes of significant weakness or numbness lasting several minutes.  He has normal strength and function of the left hand.    Past Medical History:  Diagnosis Date   Arthritis    right shoulder (02/09/2016)   Bell's palsy 01/2012; 02/09/2016   CAD (coronary artery disease)    a. inferior STEMI 04/2020 s/p DES to mRCA, EF 45-50% by echo.   Daily headache    recently (02/09/2016)   GERD (gastroesophageal reflux disease)    Hiatal hernia    History of hiatal hernia    HTN (hypertension)    Hyperlipidemia    Hypertension    Ischemic cardiomyopathy    Obesity    PAD (peripheral artery disease)    Renal insufficiency    noted 04/2020 admit   STEMI (ST elevation myocardial infarction) (HCC)    Tobacco abuse    Tobacco use    Type 2 diabetes mellitus (HCC)    Type II diabetes mellitus (HCC)     Past Surgical History:  Procedure Laterality Date   CORONARY/GRAFT ACUTE MI REVASCULARIZATION N/A 05/06/2020   Procedure: Coronary/Graft Acute MI Revascularization;  Surgeon: Court Dorn PARAS, MD;  Location: [facility] INVASIVE CV LAB;  Service: Cardiovascular;  Laterality: N/A;   LEFT HEART CATH AND CORONARY ANGIOGRAPHY N/A 05/06/2020   Procedure: LEFT HEART CATH AND CORONARY ANGIOGRAPHY;  Surgeon: Court Dorn PARAS, MD;  Location: [facility] INVASIVE CV  LAB;  Service: Cardiovascular;  Laterality: N/A;   NO PAST SURGERIES      Allergies: Patient has no known allergies.  Medications: Prior to Admission medications  Medication Sig Start Date End Date Taking? Authorizing Provider  aspirin  EC 81 MG tablet Take 1 tablet (81 mg total) by mouth daily. Swallow whole. 05/07/20  Yes Dunn, Dayna N, PA-C  lisinopril  (ZESTRIL ) 5 MG tablet TAKE 1 TABLET(5 MG) BY MOUTH DAILY 07/12/24  Yes  Court Dorn PARAS, MD  metFORMIN  (GLUCOPHAGE -XR) 750 MG 24 hr tablet Take 750 mg by mouth daily.   Yes [provider]  atorvastatin  (LIPITOR ) 80 MG tablet TAKE 1 TABLET(80 MG) BY MOUTH DAILY Patient not taking: Reported on 10/03/2024 07/12/24   Court Dorn PARAS, MD  clopidogrel  (PLAVIX ) 75 MG tablet TAKE 1 TABLET(75 MG) BY MOUTH DAILY Patient not taking: Reported on 10/03/2024 01/03/24   Court Dorn PARAS, MD  insulin  NPH-regular Human (NOVOLIN  70/30) (70-30) 100 UNIT/ML injection Inject 10 Units into the skin 2 (two) times daily with a meal. Patient not taking: Reported on 10/03/2024 06/10/20   Newlin, Enobong, MD  Insulin  Pen Needle (PEN NEEDLES) 32G X 4 MM MISC 1 each by Does not apply route 2 (two) times daily. Patient not taking: Reported on 10/03/2024 05/07/20   Dunn, Dayna N, PA-C  nitroGLYCERIN  (NITROSTAT ) 0.4 MG SL tablet Place 1 tablet (0.4 mg total) under the tongue every 5 (five) minutes as needed for chest pain (up to 3 doses max). Patient not taking: Reported on 10/03/2024 05/07/20   Dunn, Dayna N, PA-C     Family History  Family history unknown: Yes    Social History   Socioeconomic History   Marital status: Married    Spouse name: Not on file   Number of children: Not on file   Years of education: Not on file   Highest education level: Not on file  Occupational History   Not on file  Tobacco Use   Smoking status: Every Day    Current packs/day: 0.50    Average packs/day: 0.5 packs/day for 50.0 years (25.0 ttl pk-yrs)    Types: Cigarettes   Smokeless tobacco: Never   Tobacco comments:    02/09/2016 chewed from age [age]  Substance and Sexual Activity   Alcohol use: Yes    Comment: every 2-3 weeks   Drug use: Never   Sexual activity: Yes  Other Topics Concern   Not on file  Social History Narrative   ** Merged History Encounter **       Social Drivers of Health   Tobacco Use: High Risk (10/03/2024)   Patient History    Smoking Tobacco Use: Every Day     Smokeless Tobacco Use: Never    Passive Exposure: Not on file  Financial Resource Strain: Low Risk (08/10/2023)   Received from [facility]   Overall Financial Resource Strain (CARDIA)    Difficulty of Paying Living Expenses: Not hard at all  Food Insecurity: No Food Insecurity (08/10/2023)   Received from [facility]   Epic    Within the past 12 months, you worried that your food would run out before you got the money to buy more.: Never true    Within the past 12 months, the food you bought just didn't last and you didn't have money to get more.: Never true  Transportation Needs: No Transportation Needs (08/10/2023)   Received from [facility] - Transportation    Lack of Transportation (Medical):  No    Lack of Transportation (Non-Medical): No  Physical Activity: Sufficiently Active (08/10/2023)   Received from [facility]   Exercise Vital Sign    On average, how many days per week do you engage in moderate to strenuous exercise (like a brisk walk)?: 5 days    On average, how many minutes do you engage in exercise at this level?: 60 min  Stress: No Stress Concern Present (08/10/2023)   Received from [facility] - Occupational Stress Questionnaire    Feeling of Stress : Not at all  Social Connections: Moderately Integrated (08/10/2023)   Received from [facility]   Social Network    How would you rate your social network (family, work, friends)?: Adequate participation with social networks  Depression (PHQ2-9): Not on file  Alcohol Screen: Not on file  Housing: Low Risk (08/10/2023)   Received from [facility]    In the last 12 months, was there a time when you were not able to pay the mortgage or rent on time?: No    In the past 12 months, how many times have you moved where you were living?: 0    At any time in the past 12 months, were you homeless or living in a shelter (including now)?: No   Utilities: Not At Risk (08/10/2023)   Received from [facility] Utilities    Threatened with loss of utilities: No  Health Literacy: Not on file    Review of Systems  Respiratory:  Positive for shortness of breath.   Cardiovascular:  Negative for chest pain.    Vital Signs:  BP 108/79   Pulse 96   Temp 98.3 F (36.8 C) (Oral)   Ht 5' 5 (1.651 m)   Wt 194 lb (88 kg)   SpO2 99%   BMI 32.28 kg/m   Physical Exam CHEST: Lungs clear to auscultation bilaterally, no wheezes, rhonchi, or crackles. CARDIOVASCULAR: Normal heart rate and rhythm, S1 and S2 normal without murmurs. Normal pulse on right side, pulse not palpable on left side. NEUROLOGICAL: Cranial nerves grossly intact, sensation intact bilaterally, heel to shin intact, moves all extremities without gross motor or sensory deficit.  Imaging:  Results Radiology Brain MRI (08/14/2024): Punctate right pontine periaqueductal lacunar infarct (Independently interpreted) Head and neck CTA (08/14/2024): Left internal carotid artery stenosis, interpreted as 60-70% but likely less; heavily calcified left subclavian artery stenosis with subclavian steal (Independently interpreted)  Labs:  CBC: Recent Labs    08/14/24 1222 08/14/24 1237 08/15/24 0128  WBC 8.8  --  8.8  HGB 16.5 16.3 16.1  HCT 50.9 48.0 49.1  PLT 276  --  278    COAGS: No results for input(s): INR, APTT in the last 8760 hours.  BMP: Recent Labs    08/14/24 1222 08/14/24 1237 08/15/24 0128  NA 134* 132* 138  K 5.3* 4.9 4.6  CL 98  --  100  CO2 28  --  27  GLUCOSE 316*  --  257*  BUN 42*  --  41*  CALCIUM  9.8  --  8.8*  CREATININE 1.56*  --  1.81*  GFRNONAA 48*  --  40*    LIVER FUNCTION TESTS: Recent Labs    08/15/24 0128  BILITOT 0.4  AST 18  ALT 17  ALKPHOS 53  PROT 5.4*  ALBUMIN 2.4*      Electronically Signed: Nancyann LULLA Burns  10/03/2024, 3:22 PM  "

## 2024-10-17 NOTE — Addendum Note (Signed)
 Addended by: Yohann Curl on: 10/17/2024 03:35 PM   Modules accepted: Orders

## 2024-10-29 ENCOUNTER — Ambulatory Visit: Admitting: Cardiovascular Disease
